# Patient Record
Sex: Male | Born: 1945 | Race: White | Hispanic: No | Smoking: Current some day smoker
Health system: Southern US, Community
[De-identification: ages and names within clinical notes are randomized; demographics above are authoritative.]

## PROBLEM LIST (undated history)

## (undated) DIAGNOSIS — Z9289 Personal history of other medical treatment: Secondary | ICD-10-CM

## (undated) DIAGNOSIS — K7460 Unspecified cirrhosis of liver: Secondary | ICD-10-CM

## (undated) DIAGNOSIS — I5022 Chronic systolic (congestive) heart failure: Secondary | ICD-10-CM

## (undated) DIAGNOSIS — M1990 Unspecified osteoarthritis, unspecified site: Secondary | ICD-10-CM

## (undated) DIAGNOSIS — R519 Headache, unspecified: Secondary | ICD-10-CM

## (undated) DIAGNOSIS — A159 Respiratory tuberculosis unspecified: Secondary | ICD-10-CM

## (undated) DIAGNOSIS — I480 Paroxysmal atrial fibrillation: Secondary | ICD-10-CM

## (undated) DIAGNOSIS — F1210 Cannabis abuse, uncomplicated: Secondary | ICD-10-CM

## (undated) DIAGNOSIS — J189 Pneumonia, unspecified organism: Secondary | ICD-10-CM

## (undated) DIAGNOSIS — Z720 Tobacco use: Secondary | ICD-10-CM

## (undated) DIAGNOSIS — K219 Gastro-esophageal reflux disease without esophagitis: Secondary | ICD-10-CM

## (undated) DIAGNOSIS — R51 Headache: Secondary | ICD-10-CM

## (undated) DIAGNOSIS — B1920 Unspecified viral hepatitis C without hepatic coma: Secondary | ICD-10-CM

## (undated) DIAGNOSIS — I10 Essential (primary) hypertension: Secondary | ICD-10-CM

## (undated) DIAGNOSIS — F419 Anxiety disorder, unspecified: Secondary | ICD-10-CM

## (undated) DIAGNOSIS — J939 Pneumothorax, unspecified: Secondary | ICD-10-CM

## (undated) DIAGNOSIS — I739 Peripheral vascular disease, unspecified: Secondary | ICD-10-CM

## (undated) DIAGNOSIS — E7800 Pure hypercholesterolemia, unspecified: Secondary | ICD-10-CM

## (undated) DIAGNOSIS — N200 Calculus of kidney: Secondary | ICD-10-CM

## (undated) DIAGNOSIS — K7290 Hepatic failure, unspecified without coma: Secondary | ICD-10-CM

## (undated) DIAGNOSIS — J449 Chronic obstructive pulmonary disease, unspecified: Secondary | ICD-10-CM

## (undated) DIAGNOSIS — I2510 Atherosclerotic heart disease of native coronary artery without angina pectoris: Secondary | ICD-10-CM

## (undated) DIAGNOSIS — I428 Other cardiomyopathies: Secondary | ICD-10-CM

## (undated) HISTORY — PX: EXTRACORPOREAL SHOCK WAVE LITHOTRIPSY: SHX1557

## (undated) HISTORY — PX: CATARACT EXTRACTION W/ INTRAOCULAR LENS  IMPLANT, BILATERAL: SHX1307

---

## 1998-02-27 DIAGNOSIS — Z9289 Personal history of other medical treatment: Secondary | ICD-10-CM

## 1998-02-27 HISTORY — PX: ABOVE KNEE LEG AMPUTATION: SUR20

## 1998-02-27 HISTORY — DX: Personal history of other medical treatment: Z92.89

## 2005-02-27 DIAGNOSIS — A159 Respiratory tuberculosis unspecified: Secondary | ICD-10-CM

## 2005-02-27 HISTORY — DX: Respiratory tuberculosis unspecified: A15.9

## 2015-05-29 DIAGNOSIS — J189 Pneumonia, unspecified organism: Secondary | ICD-10-CM

## 2015-05-29 HISTORY — DX: Pneumonia, unspecified organism: J18.9

## 2015-06-23 ENCOUNTER — Encounter (HOSPITAL_COMMUNITY): Payer: Self-pay | Admitting: Emergency Medicine

## 2015-06-23 ENCOUNTER — Emergency Department (HOSPITAL_COMMUNITY): Payer: Medicare Other

## 2015-06-23 ENCOUNTER — Inpatient Hospital Stay (HOSPITAL_COMMUNITY)
Admission: EM | Admit: 2015-06-23 | Discharge: 2015-06-28 | DRG: 286 | Disposition: A | Discharge status: Home / Self Care | Payer: Medicare Other | Attending: Oncology | Admitting: Oncology

## 2015-06-23 DIAGNOSIS — E43 Unspecified severe protein-calorie malnutrition: Secondary | ICD-10-CM | POA: Insufficient documentation

## 2015-06-23 DIAGNOSIS — J440 Chronic obstructive pulmonary disease with acute lower respiratory infection: Secondary | ICD-10-CM | POA: Diagnosis present

## 2015-06-23 DIAGNOSIS — I2510 Atherosclerotic heart disease of native coronary artery without angina pectoris: Secondary | ICD-10-CM | POA: Diagnosis not present

## 2015-06-23 DIAGNOSIS — N400 Enlarged prostate without lower urinary tract symptoms: Secondary | ICD-10-CM | POA: Diagnosis present

## 2015-06-23 DIAGNOSIS — I739 Peripheral vascular disease, unspecified: Secondary | ICD-10-CM | POA: Diagnosis present

## 2015-06-23 DIAGNOSIS — J9620 Acute and chronic respiratory failure, unspecified whether with hypoxia or hypercapnia: Secondary | ICD-10-CM | POA: Diagnosis present

## 2015-06-23 DIAGNOSIS — J189 Pneumonia, unspecified organism: Secondary | ICD-10-CM | POA: Diagnosis present

## 2015-06-23 DIAGNOSIS — R64 Cachexia: Secondary | ICD-10-CM | POA: Diagnosis present

## 2015-06-23 DIAGNOSIS — I480 Paroxysmal atrial fibrillation: Secondary | ICD-10-CM | POA: Diagnosis present

## 2015-06-23 DIAGNOSIS — F17200 Nicotine dependence, unspecified, uncomplicated: Secondary | ICD-10-CM | POA: Diagnosis present

## 2015-06-23 DIAGNOSIS — I5021 Acute systolic (congestive) heart failure: Secondary | ICD-10-CM | POA: Diagnosis present

## 2015-06-23 DIAGNOSIS — A159 Respiratory tuberculosis unspecified: Secondary | ICD-10-CM | POA: Insufficient documentation

## 2015-06-23 DIAGNOSIS — Z8611 Personal history of tuberculosis: Secondary | ICD-10-CM

## 2015-06-23 DIAGNOSIS — Z681 Body mass index (BMI) 19 or less, adult: Secondary | ICD-10-CM | POA: Diagnosis not present

## 2015-06-23 DIAGNOSIS — R768 Other specified abnormal immunological findings in serum: Secondary | ICD-10-CM | POA: Insufficient documentation

## 2015-06-23 DIAGNOSIS — I4891 Unspecified atrial fibrillation: Secondary | ICD-10-CM | POA: Diagnosis present

## 2015-06-23 DIAGNOSIS — L8990 Pressure ulcer of unspecified site, unspecified stage: Secondary | ICD-10-CM | POA: Diagnosis present

## 2015-06-23 DIAGNOSIS — I429 Cardiomyopathy, unspecified: Secondary | ICD-10-CM | POA: Diagnosis present

## 2015-06-23 DIAGNOSIS — R54 Age-related physical debility: Secondary | ICD-10-CM | POA: Diagnosis present

## 2015-06-23 DIAGNOSIS — I5023 Acute on chronic systolic (congestive) heart failure: Secondary | ICD-10-CM | POA: Insufficient documentation

## 2015-06-23 DIAGNOSIS — Z89611 Acquired absence of right leg above knee: Secondary | ICD-10-CM

## 2015-06-23 DIAGNOSIS — I110 Hypertensive heart disease with heart failure: Secondary | ICD-10-CM | POA: Diagnosis present

## 2015-06-23 DIAGNOSIS — B182 Chronic viral hepatitis C: Secondary | ICD-10-CM | POA: Diagnosis present

## 2015-06-23 DIAGNOSIS — J431 Panlobular emphysema: Secondary | ICD-10-CM | POA: Insufficient documentation

## 2015-06-23 DIAGNOSIS — J449 Chronic obstructive pulmonary disease, unspecified: Secondary | ICD-10-CM | POA: Diagnosis not present

## 2015-06-23 HISTORY — DX: Peripheral vascular disease, unspecified: I73.9

## 2015-06-23 HISTORY — DX: Chronic obstructive pulmonary disease, unspecified: J44.9

## 2015-06-23 LAB — CBC
HEMATOCRIT: 43.4 % (ref 39.0–52.0)
HEMOGLOBIN: 13.9 g/dL (ref 13.0–17.0)
MCH: 31.4 pg (ref 26.0–34.0)
MCHC: 32 g/dL (ref 30.0–36.0)
MCV: 98 fL (ref 78.0–100.0)
Platelets: 139 10*3/uL — ABNORMAL LOW (ref 150–400)
RBC: 4.43 MIL/uL (ref 4.22–5.81)
RDW: 14.6 % (ref 11.5–15.5)
WBC: 7.3 10*3/uL (ref 4.0–10.5)

## 2015-06-23 LAB — BASIC METABOLIC PANEL
Anion gap: 13 (ref 5–15)
BUN: 11 mg/dL (ref 6–20)
CHLORIDE: 103 mmol/L (ref 101–111)
CO2: 22 mmol/L (ref 22–32)
Calcium: 8.8 mg/dL — ABNORMAL LOW (ref 8.9–10.3)
Creatinine, Ser: 0.85 mg/dL (ref 0.61–1.24)
GFR calc Af Amer: >60 mL/min (ref >60)
Glucose, Bld: 122 mg/dL — ABNORMAL HIGH (ref 65–99)
Potassium: 4.3 mmol/L (ref 3.5–5.1)
SODIUM: 138 mmol/L (ref 135–145)

## 2015-06-23 LAB — RAPID URINE DRUG SCREEN, HOSP PERFORMED
AMPHETAMINES: NOT DETECTED
Barbiturates: NOT DETECTED
Benzodiazepines: POSITIVE — AB
Cocaine: NOT DETECTED
OPIATES: NOT DETECTED
Tetrahydrocannabinol: POSITIVE — AB

## 2015-06-23 LAB — URINALYSIS, ROUTINE W REFLEX MICROSCOPIC
Bilirubin Urine: NEGATIVE
Glucose, UA: NEGATIVE mg/dL
KETONES UR: NEGATIVE mg/dL
LEUKOCYTES UA: NEGATIVE
NITRITE: NEGATIVE
Protein, ur: NEGATIVE mg/dL
pH: 6 (ref 5.0–8.0)

## 2015-06-23 LAB — URINE MICROSCOPIC-ADD ON: Squamous Epithelial / LPF: NONE SEEN

## 2015-06-23 LAB — MAGNESIUM: Magnesium: 1.7 mg/dL (ref 1.7–2.4)

## 2015-06-23 LAB — BRAIN NATRIURETIC PEPTIDE: B Natriuretic Peptide: 524.4 pg/mL — ABNORMAL HIGH (ref 0.0–100.0)

## 2015-06-23 LAB — HEPATIC FUNCTION PANEL
ALBUMIN: 3.1 g/dL — AB (ref 3.5–5.0)
ALT: 39 U/L (ref 17–63)
AST: 48 U/L — AB (ref 15–41)
Alkaline Phosphatase: 76 U/L (ref 38–126)
BILIRUBIN DIRECT: 0.2 mg/dL (ref 0.1–0.5)
Indirect Bilirubin: 0.4 mg/dL (ref 0.3–0.9)
Total Bilirubin: 0.6 mg/dL (ref 0.3–1.2)
Total Protein: 7.4 g/dL (ref 6.5–8.1)

## 2015-06-23 LAB — I-STAT TROPONIN, ED: Troponin i, poc: 0 ng/mL (ref 0.00–0.08)

## 2015-06-23 LAB — PROCALCITONIN

## 2015-06-23 LAB — TROPONIN I: TROPONIN I: 0.03 ng/mL (ref <0.031)

## 2015-06-23 LAB — TSH: TSH: 0.787 u[IU]/mL (ref 0.350–4.500)

## 2015-06-23 MED ORDER — DILTIAZEM LOAD VIA INFUSION
10.0000 mg | Freq: Once | INTRAVENOUS | Status: AC
  Administered 2015-06-23: 10 mg via INTRAVENOUS
  Filled 2015-06-23: qty 10

## 2015-06-23 MED ORDER — PANTOPRAZOLE SODIUM 40 MG PO TBEC
40.0000 mg | DELAYED_RELEASE_TABLET | Freq: Every day | ORAL | Status: DC
  Administered 2015-06-23 – 2015-06-28 (×6): 40 mg via ORAL
  Filled 2015-06-23 (×6): qty 1

## 2015-06-23 MED ORDER — METOPROLOL TARTRATE 12.5 MG HALF TABLET
12.5000 mg | ORAL_TABLET | Freq: Two times a day (BID) | ORAL | Status: DC
  Administered 2015-06-23 – 2015-06-25 (×4): 12.5 mg via ORAL
  Filled 2015-06-23 (×4): qty 1

## 2015-06-23 MED ORDER — SODIUM CHLORIDE 0.9 % IV BOLUS (SEPSIS)
1000.0000 mL | Freq: Once | INTRAVENOUS | Status: AC
  Administered 2015-06-23: 1000 mL via INTRAVENOUS

## 2015-06-23 MED ORDER — ACETAMINOPHEN 325 MG PO TABS
650.0000 mg | ORAL_TABLET | Freq: Four times a day (QID) | ORAL | Status: DC | PRN
  Administered 2015-06-23 – 2015-06-24 (×3): 650 mg via ORAL
  Filled 2015-06-23 (×4): qty 2

## 2015-06-23 MED ORDER — IOPAMIDOL (ISOVUE-370) INJECTION 76%
INTRAVENOUS | Status: AC
  Administered 2015-06-23: 100 mL
  Filled 2015-06-23: qty 100

## 2015-06-23 MED ORDER — ENOXAPARIN SODIUM 40 MG/0.4ML ~~LOC~~ SOLN
40.0000 mg | SUBCUTANEOUS | Status: DC
  Administered 2015-06-23 – 2015-06-25 (×3): 40 mg via SUBCUTANEOUS
  Filled 2015-06-23 (×3): qty 0.4

## 2015-06-23 MED ORDER — IPRATROPIUM-ALBUTEROL 0.5-2.5 (3) MG/3ML IN SOLN
3.0000 mL | RESPIRATORY_TRACT | Status: DC | PRN
  Administered 2015-06-23 – 2015-06-25 (×3): 3 mL via RESPIRATORY_TRACT
  Filled 2015-06-23 (×3): qty 3

## 2015-06-23 MED ORDER — SODIUM CHLORIDE 0.9% FLUSH
3.0000 mL | Freq: Two times a day (BID) | INTRAVENOUS | Status: DC
  Administered 2015-06-23 – 2015-06-25 (×3): 3 mL via INTRAVENOUS

## 2015-06-23 MED ORDER — DEXTROSE 5 % IV SOLN
1.0000 g | INTRAVENOUS | Status: DC
  Administered 2015-06-23: 1 g via INTRAVENOUS
  Filled 2015-06-23 (×2): qty 10

## 2015-06-23 MED ORDER — DOCUSATE SODIUM 100 MG PO CAPS
100.0000 mg | ORAL_CAPSULE | Freq: Every day | ORAL | Status: DC | PRN
  Administered 2015-06-23: 100 mg via ORAL
  Filled 2015-06-23: qty 1

## 2015-06-23 MED ORDER — DEXTROSE 5 % IV SOLN
500.0000 mg | INTRAVENOUS | Status: AC
  Administered 2015-06-23 – 2015-06-24 (×2): 500 mg via INTRAVENOUS
  Filled 2015-06-23 (×2): qty 500

## 2015-06-23 MED ORDER — DILTIAZEM HCL 100 MG IV SOLR
5.0000 mg/h | INTRAVENOUS | Status: DC
  Administered 2015-06-23: 15 mg/h via INTRAVENOUS
  Administered 2015-06-23 (×2): 5 mg/h via INTRAVENOUS
  Filled 2015-06-23 (×3): qty 100

## 2015-06-23 MED ORDER — GUAIFENESIN-CODEINE 100-10 MG/5ML PO SOLN
10.0000 mL | Freq: Four times a day (QID) | ORAL | Status: DC | PRN
  Administered 2015-06-23 (×3): 10 mL via ORAL
  Filled 2015-06-23 (×3): qty 10

## 2015-06-23 MED ORDER — MAGNESIUM SULFATE IN D5W 1-5 GM/100ML-% IV SOLN
1.0000 g | Freq: Once | INTRAVENOUS | Status: AC
  Administered 2015-06-23: 1 g via INTRAVENOUS
  Filled 2015-06-23: qty 100

## 2015-06-23 NOTE — ED Notes (Signed)
Patient with shortness of breath that started last night.  Patient states that he was having a hard time to breath while sitting down.  Patient does have a cough with the shortness of breath.

## 2015-06-23 NOTE — Progress Notes (Signed)
Patient lying in bed, difficulty with breathing. Repositioned him, no other needs at this time. Call light within reach

## 2015-06-23 NOTE — ED Provider Notes (Signed)
TIME SEEN: 6:10 AM  CHIEF COMPLAINT: Shortness of breath  HPI: Pt is a 70 y.o. 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 Shortness of breath was significantly worse last night. He has had intermittent central chest pain that he is unable to describe. Last episode was several days ago. Denies fevers. Has had a productive cough with clear sputum. No vomiting or diarrhea. No history of PE or DVT. Reports he got down to 100 pounds but has gained 18 pounds in the past several weeks. States he lost all that way because of poor appetite. Denies any known history of cancer. Has never been told he has atrial fibrillation before. Denies heavy alcohol use, illicit drug use.  ROS: See HPI Constitutional: no fever  Eyes: no drainage  ENT: no runny nose   Cardiovascular:  chest pain  Resp: SOB  GI: no vomiting GU: no dysuria Integumentary: no rash  Allergy: no hives  Musculoskeletal: no leg swelling  Neurological: no slurred speech ROS otherwise negative  PAST MEDICAL HISTORY/PAST SURGICAL HISTORY:  Past Medical History  Diagnosis Date  . COPD (chronic obstructive pulmonary disease) (HCC)   . PVD (peripheral vascular disease) (HCC)     MEDICATIONS:  Prior to Admission medications   Not on File    ALLERGIES:  No Known Allergies  SOCIAL HISTORY:  Social History  Substance Use Topics  . Smoking status: Current Some Day Smoker  . Smokeless tobacco: Not on file  . Alcohol Use: No    FAMILY HISTORY: No family history on file.  EXAM: BP 121/82 mmHg  Pulse 85  Temp(Src) 98.1 F (36.7 C) (Oral)  Resp 20  SpO2 96% CONSTITUTIONAL: Alert and oriented and responds appropriately to questions. Patient appears thin, chronically ill-appearing HEAD:  Normocephalic EYES: Conjunctivae clear, PERRL ENT: normal nose; no rhinorrhea; moist mucous membranes NECK: Supple, no meningismus, no LAD  CARD: Irregularly irregular and tachycardic; S1 and S2 appreciated; no murmurs, no clicks, no rubs, no gallops RESP: Normal chest excursion without splinting or tachypnea; breath sounds clear and equal bilaterally; no wheezes, no rhonchi, no rales, no hypoxia or respiratory distress, speaking full sentences, diminished at bases bilaterally, oxygen saturation 94% on room air at rest ABD/GI: Normal bowel sounds; non-distended; soft, non-tender, no rebound, no guarding, no peritoneal signs BACK:  The back appears normal and is non-tender to palpation, there is no CVA tenderness EXT: Patient is status post right AKA; Normal ROM in all joints; non-tender to palpation; no edema; normal capillary refill; no cyanosis, no calf tenderness or swelling    SKIN: Normal color for age and race; warm; no rash NEURO: Moves all extremities equally, sensation to light touch intact diffusely, cranial nerves II through XII intact PSYCH: The patient's mood and manner are appropriate. Grooming and personal hygiene are appropriate.  MEDICAL DECISION MAKING: Patient here with shortness of breath, new onset atrial fibrillation with RVR. Blood pressure is normal. Lungs clear except for diminished aeration at his bases bilaterally. Concern for possible pulmonary embolus, lung cancer given his presentation, appearance. We'll give IV fluids, diltiazem bolus and drip. We'll obtain cardiac labs, CT of his chest. He will need admission.  ED PROGRESS: 7:45 AM  Pt's heart rate is down to the 110s still in atrial fibrillation. He is on  15 mg per hour of diltiazem. Labs show potassium of 4.3, magnesium of 1.7. Will give IV magnesium. Troponin is negative. BNP mildly elevated at 524. TSH normal. Chest x-ray shows lingular airspace disease worrisome for pneumonia. He states he is having a cough with  clear sputum production but no fever. No leukocytosis. I do not feel clinically has pneumonia. We will obtain a CT of his chest to evaluate for possible PE. Discussed this with Dr. Yetta Barre with internal medicine teaching service. He agrees on admission to telemetry, inpatient. I will place will orders per his request. They're attending is Dr. Blanch Media. Patient and family have been updated with this plan. We'll hold off on antibiotics at this time until after CT scan and if CT shows pneumonia, and bikes can be ordered by internal medicine service. Patient is hemodynamically stable.     EKG Interpretation  Date/Time:  Wednesday June 23 2015 05:54:54 EDT Ventricular Rate:  185 PR Interval:    QRS Duration: 91 QT Interval:  272 QTC Calculation: 477 R Axis:   91 Text Interpretation:  Atrial fibrillation with rapid V-rate Anterior infarct, old Baseline wander in lead(s) II III aVF V3 No old tracing to compare Confirmed by Rakiyah Esch,  DO, Fernando Stoiber ([id number]) on 06/23/2015 6:04:09 AM         CRITICAL CARE Performed by: Raelyn Number   Total critical care time: 45 minutes  Critical care time was exclusive of separately billable procedures and treating other patients.  Critical care was necessary to treat or prevent imminent or life-threatening deterioration.  Critical care was time spent personally by me on the following activities: development of treatment plan with patient and/or surrogate as well as nursing, discussions with consultants, evaluation of patient's response to treatment, examination of patient, obtaining history from patient or surrogate, ordering and performing treatments and interventions, ordering and review of laboratory studies, ordering and review of radiographic studies, pulse oximetry and re-evaluation of patient's condition.   Layla Maw Jalee Saine, DO 06/23/15 [phone]

## 2015-06-23 NOTE — H&P (Signed)
Date: 06/23/2015               Patient Name:  [name] MRN: [id number]  DOB: 1945/11/30 Age / Sex: 70 y.o., male   PCP: No primary care provider on file.         Medical Service: Internal Medicine Teaching Service         Attending Physician: Dr. Burns Spain, MD    First Contact: Dr. Ladona Ridgel Pager: [phone]  Second Contact: Dr. Beckie Salts Pager: [phone]       After Hours (After 5p/  First Contact Pager: [phone]  weekends / holidays): Second Contact Pager: [phone]   Chief Complaint: SOB  History of Present Illness: [name] is a 70 y.o. male w/ PMHx of COPD, Chronic Hep C, PVD s/p RLE AKA, and tobacco abuse, presents to the ED w/ complaints of worsening SOB and cough over the past 2-3 months. He says that over the past 1-2 weeks it has become too overwhelming and today he felt the need to come to the ED. He also describes an associated productive cough with yellow sputum, and recent chills. He denies nausea, vomiting, diarrhea, abdominal pain, myalgias, or fatigue. Does have some mild generalized weakness however. He has had weight loss over the past year, but actually says he feels like he has gained about 10-15 lbs in the last few weeks. He describes a general lack of appetite. [facility] [place] but has not been seen there for several months. He uses an inhaler at times for his known COPD, but has not for a while because he has not been seen at the [place] for months. He also admits to having sleep in a recliner due to SOB when he sleeps, but denies PND.   Of note, patient has previous history of Tb that he states was treated with full course of RIPE therapy, he thinks about 5 years ago at the [place]. Says he spent 11 days at the hospital and then was supervised at home by the [facility] for each medication dose for several months. Is not sure how he  Acquired Tb at that time, thinks he got it from the [facility] hospital.   In  the ED, patient was noted to have quite rapid Atrial Fibrillation in the 180's with mild dizziness and palpitations. Says he has experienced these symptoms before.   Patient denies alcohol use, says he does smoke marijuana on occasion, sometimes will take a Xanax to help him sleep, but otherwise denies recreational drug use. Says he used to use cocaine several years ago. He smokes 1-2 cigs per day. No significant family history.  Meds: Current Facility-Administered Medications  Medication Dose Route Frequency Provider Last Rate Last Dose  . azithromycin (ZITHROMAX) 500 mg in dextrose 5 % 250 mL IVPB  500 mg Intravenous Q24H Courtney Paris, MD   500 mg at 06/23/15 1114  . cefTRIAXone (ROCEPHIN) 1 g in dextrose 5 % 50 mL IVPB  1 g Intravenous Q24H Courtney Paris, MD   1 g at 06/23/15 1015  . diltiazem (CARDIZEM) 100 mg in dextrose 5 % 100 mL (1 mg/mL) infusion  5-15 mg/hr Intravenous Continuous Kristen N Ward, DO 15 mL/hr at 06/23/15 1033 15 mg/hr at 06/23/15 1033  . enoxaparin (LOVENOX) injection 40 mg  40 mg Subcutaneous Q24H Courtney Paris, MD   40 mg at 06/23/15 1016  . guaiFENesin-codeine 100-10 MG/5ML solution 10 mL  10 mL Oral Q6H PRN Courtney Paris, MD   10 mL at 06/23/15 1033  . ipratropium-albuterol (DUONEB) 0.5-2.5 (3) MG/3ML nebulizer solution 3 mL  3 mL Nebulization Q4H PRN Courtney Paris, MD      . sodium chloride flush (NS) 0.9 % injection 3 mL  3 mL Intravenous Q12H Courtney Paris, MD   0 mL at 06/23/15 1016    Allergies: Allergies as of 06/23/2015  . (No Known Allergies)   Past Medical History  Diagnosis Date  . COPD (chronic obstructive pulmonary disease) (HCC)   . PVD (peripheral vascular disease) (HCC)    History reviewed. No pertinent past surgical history. No family history on file. Social History   Social History  . Marital Status: Single    Spouse Name: N/A  . Number of Children: N/A  . Years of Education: N/A   Occupational History  . Not on file.   Social History  Main Topics  . Smoking status: Current Some Day Smoker  . Smokeless tobacco: Not on file  . Alcohol Use: No  . Drug Use: Yes    Special: Marijuana  . Sexual Activity: Not on file   Other Topics Concern  . Not on file   Social History Narrative  . No narrative on file    Review of Systems: General: Positive for chills and decreased appetite. Denies fever, diaphoresis, and fatigue.  Respiratory: Positive for DOE/SOB, productive cough, and wheezing. Denies chest tightness.   Cardiovascular: Positive for palpitations. Denies chest pain.  Gastrointestinal: Denies nausea, vomiting, abdominal pain, diarrhea, constipation, blood in stool and abdominal distention.  Genitourinary: Positive for hematuria, urgency, and increased urinary frequency. Denies dysuria and flank pain. Endocrine: Denies hot or cold intolerance, polyuria, and polydipsia. Musculoskeletal: Denies myalgias, back pain, joint swelling, arthralgias and gait problem.  Skin: Denies pallor, rash and wounds.  Neurological: Positive for generalized weakness and dizziness. Denies seizures, syncope, lightheadedness, numbness and headaches.  Psychiatric/Behavioral: Denies mood changes, confusion, nervousness, sleep disturbance and agitation.   Physical Exam: Blood pressure 108/62, pulse 88, temperature 97.7 F (36.5 C), temperature source Oral, resp. rate 18, SpO2 91 %.  General: Thin appearing while male, alert, cooperative, NAD. HEENT: PERRL, EOMI. Moist mucus membranes. Poor dentition.  Neck: Full range of motion without pain, supple, no lymphadenopathy or carotid bruits. No JVD.  Lungs: Air entry equal bilaterally. Decreased breath sounds throughout, scattered faint wheezes. No rales or rhonchi appreciated. Heart: Distant heart sounds, rate controlled, irregular rhythm, no murmurs, gallops, or rubs Abdomen: Soft, non-tender, non-distended, BS + Extremities: No cyanosis, clubbing, or edema. RLE AKA. Mild chronic venous stasis  changes on LLE. Distal pulse intact.  Neurologic: Alert & oriented x3, cranial nerves II-XII intact, strength grossly intact, sensation intact to light touch   Lab results: Basic Metabolic Panel:  Recent Labs  08/65/78 0615  NA 138  K 4.3  CL 103  CO2 22  GLUCOSE 122*  BUN 11  CREATININE 0.85  CALCIUM 8.8*  MG 1.7   Liver Function Tests:  Recent Labs  06/23/15 0615  AST 48*  ALT 39  ALKPHOS 76  BILITOT 0.6  PROT 7.4  ALBUMIN 3.1*    CBC:  Recent Labs  06/23/15 0615  WBC 7.3  HGB 13.9  HCT 43.4  MCV 98.0  PLT 139*   Cardiac Enzymes:  Recent Labs  06/23/15 0615  TROPONINI 0.03   BNP: 524.4  Thyroid Function Tests:  Recent Labs  06/23/15 0615  TSH 0.787  Urine Drug Screen: Drugs of Abuse     Component Value Date/Time   LABOPIA NONE DETECTED 06/23/2015 0810   COCAINSCRNUR NONE DETECTED 06/23/2015 0810   LABBENZ POSITIVE* 06/23/2015 0810   AMPHETMU NONE DETECTED 06/23/2015 0810   THCU POSITIVE* 06/23/2015 0810   LABBARB NONE DETECTED 06/23/2015 0810    Imaging results:  Ct Angio Chest Pe W/cm &/or Wo Cm  06/23/2015  CLINICAL DATA:  Shortness of breath and chest pain, initial encounter EXAM: CT ANGIOGRAPHY CHEST WITH CONTRAST TECHNIQUE: Multidetector CT imaging of the chest was performed using the standard protocol during bolus administration of intravenous contrast. Multiplanar CT image reconstructions and MIPs were obtained to evaluate the vascular anatomy. CONTRAST:  100 mL Isovue 370. COMPARISON:  Plain film from earlier in the same day. FINDINGS: Biapical scarring with calcifications are identified. Diffuse emphysematous changes are noted within both lungs. Infiltrate is noted in the lower lobe anteriorly on the left consistent with that seen on the prior exam but lying within the lower lobe as opposed to the lingula. Small pleural effusion is noted. Some mild posterior lower lobe infiltrate is seen. Scattered changes are noted within the  right lung in a patchy area of somewhat infiltrative changes noted in best seen on image number 49 of series 1006. The thoracic inlet is within normal limits. The thoracic aorta shows calcifications. No aneurysmal dilatation or definitive dissection is seen. Pulmonary artery demonstrates a normal branching pattern. No filling defects are identified. Some calcified lymph nodes are noted as well as some calcified at mediastinal lymph nodes in the subcarinal region. The most prominent right hilar lymph node measures approximately 14 mm in short axis. Coronary calcifications are seen. Scanning into the upper abdomen shows renal cystic change on the left. The osseous structures show diffuse osteopenia. Motion artifact somewhat limits evaluation of the rib cage. No acute appearing compression deformities are noted. Mild compression deformity is seen at T8 and T12. Review of the MIP images confirms the above findings. IMPRESSION: No definitive pulmonary emboli. Patchy infiltrates bilaterally worse in the left lower lobe. Followup PA and lateral chest X-ray is recommended in 3-4 weeks following trial of antibiotic therapy to ensure resolution and exclude underlying malignancy. Changes consistent with prior granulomatous disease. Electronically Signed   By: Alcide Clever M.D.   On: 06/23/2015 09:45   Dg Chest Portable 1 View  06/23/2015  CLINICAL DATA:  Mid chest and epigastric pain today. Initial encounter. EXAM: PORTABLE CHEST 1 VIEW COMPARISON:  None. FINDINGS: The lungs are emphysematous. Focal airspace disease is seen in the lingula. Multiple calcified granulomata are seen in the left upper lobe. A small cluster of calcifications is also seen in the right upper lobe. Heart size is enlarged. No pneumothorax or pleural effusion. IMPRESSION: Lingular airspace disease worrisome for pneumonia. Recommend followup to clearing. Emphysema. Old granulomatous disease. Electronically Signed   By: Drusilla Kanner M.D.   On:  06/23/2015 07:24    Other results: EKG: Atrial Fibrillation w/ RVR, rate of 185.   Assessment & Plan by Problem: 70 y.o. male w/ PMHx of COPD, Chronic Hep C, PVD s/p RLE AKA, and tobacco abuse, admitted for LML pneumonia.   Shortness of Breath: Patient with prolonged cough and SOB, worse over the last few weeks. Cough productive of yellowish sputum. Also noted fevers at home. Patient with known h/o lung disease (COPD) but no PFT's on file given that he receives his care at the [place] in [place] per the patient but has not  been there for several months. Only uses an albuterol inhaler at times but has not for a long time. Has symptoms of orthopnea, but no PND. On admission, patient had very rapid Atrial Fibrillation which was not previously diagnosed, as well as a LML pneumonia seen on CXR and CTA. No PE on CT. Patient also has known h/o Tb which he states he was treated for ~5 years ago. Suspect his SOB and productive cough is 2/2 community acquired pneumonia as seen on imaging, although patient has no leukocytosis or documented fever. Also very likely that his exertional dyspnea could be related to undiagnosed A-fib with RVR. Only scant wheezing on exam, do not think this is as much related to a COPD exacerbation as it is CAP  -Admit to telemetry -Start Rocephin + Azithromycin for CAP coverage -Check PCT -Continue Cardizem gtt; change to po when able -ECHO pending -Repeat EKG in AM -Duonebs q4h prn -Guaifenesin-Codeine prn for cough -If no improvement in pulmonary symptoms in 24 hours, would consider adding steroids -If no improvement in pulmonary symptoms in 72 hours, would consider alternative diagnosis such as atypical infection vs LML malignancy given chronicity of symptoms -Supplemental O2  COPD: Patient with extensive bullous disease on CT, states he has been told he has emphysema in the past. Still smokes 1-2 cigs per day. No PFT's given that he receives his care at [place].  -Duonebs as  above -Consider steroids if no improvement in respiratory symptoms -Would likely benefit from outpatient PFT's and inhalers on discharge  Previous h/o Tb: Says this was diagnosed ~5 years ago at the [place], [place]. Fully treated. Spent 11 days in the hospital and had witnessed medication administration with the [facility]. Obvious granulomatous disease on CTA.  -Obtain records from [facility]  Chronic Hepatitis C: Patient knows he has this. Mild LFT abnormalities. Has discussed treatment at the [place] in the past.  -Further management as an outpatient  Protein Calorie Malnutrition: Patient quite thin, ill-appearing, low albumin.  -Nutrition consult  Hematuria: Patient reported. Says he had a bit of blood in his urin recently. Also with BPH symptoms.  -Check UA -Consider starting Flomax for BPH symptoms  DVT/PE PPx: Lovenox [place]  Dispo: Disposition is deferred at this time, awaiting improvement of current medical problems. Anticipated discharge in approximately 1-2 day(s).   The patient does have a current PCP (No primary care provider on file.) and does not need an [facility] hospital follow-up appointment after discharge.  The patient does not have transportation limitations that hinder transportation to clinic appointments.  Signed: Courtney Paris, MD 06/23/2015, 11:40 AM

## 2015-06-23 NOTE — ED Notes (Signed)
Pt transported to CT ?

## 2015-06-23 NOTE — ED Notes (Signed)
Pt granddaughter # [phone]

## 2015-06-24 ENCOUNTER — Inpatient Hospital Stay (HOSPITAL_COMMUNITY): Payer: Medicare Other

## 2015-06-24 DIAGNOSIS — J449 Chronic obstructive pulmonary disease, unspecified: Secondary | ICD-10-CM

## 2015-06-24 DIAGNOSIS — E46 Unspecified protein-calorie malnutrition: Secondary | ICD-10-CM

## 2015-06-24 DIAGNOSIS — B182 Chronic viral hepatitis C: Secondary | ICD-10-CM

## 2015-06-24 DIAGNOSIS — R319 Hematuria, unspecified: Secondary | ICD-10-CM

## 2015-06-24 DIAGNOSIS — I4891 Unspecified atrial fibrillation: Secondary | ICD-10-CM

## 2015-06-24 DIAGNOSIS — Z8611 Personal history of tuberculosis: Secondary | ICD-10-CM

## 2015-06-24 DIAGNOSIS — L8990 Pressure ulcer of unspecified site, unspecified stage: Secondary | ICD-10-CM | POA: Insufficient documentation

## 2015-06-24 DIAGNOSIS — E43 Unspecified severe protein-calorie malnutrition: Secondary | ICD-10-CM | POA: Insufficient documentation

## 2015-06-24 DIAGNOSIS — Z87891 Personal history of nicotine dependence: Secondary | ICD-10-CM

## 2015-06-24 DIAGNOSIS — I739 Peripheral vascular disease, unspecified: Secondary | ICD-10-CM

## 2015-06-24 DIAGNOSIS — J9620 Acute and chronic respiratory failure, unspecified whether with hypoxia or hypercapnia: Secondary | ICD-10-CM

## 2015-06-24 LAB — BASIC METABOLIC PANEL
ANION GAP: 9 (ref 5–15)
BUN: 12 mg/dL (ref 6–20)
CALCIUM: 8.1 mg/dL — AB (ref 8.9–10.3)
CHLORIDE: 106 mmol/L (ref 101–111)
CO2: 22 mmol/L (ref 22–32)
Creatinine, Ser: 0.85 mg/dL (ref 0.61–1.24)
GFR calc Af Amer: >60 mL/min (ref >60)
GFR calc non Af Amer: >60 mL/min (ref >60)
Glucose, Bld: 135 mg/dL — ABNORMAL HIGH (ref 65–99)
Potassium: 4.7 mmol/L (ref 3.5–5.1)
Sodium: 137 mmol/L (ref 135–145)

## 2015-06-24 LAB — CBC
HCT: 39.3 % (ref 39.0–52.0)
Hemoglobin: 12.4 g/dL — ABNORMAL LOW (ref 13.0–17.0)
MCH: 31.2 pg (ref 26.0–34.0)
MCHC: 31.6 g/dL (ref 30.0–36.0)
MCV: 99 fL (ref 78.0–100.0)
Platelets: 123 10*3/uL — ABNORMAL LOW (ref 150–400)
RBC: 3.97 MIL/uL — AB (ref 4.22–5.81)
RDW: 14.9 % (ref 11.5–15.5)
WBC: 6.1 10*3/uL (ref 4.0–10.5)

## 2015-06-24 LAB — HIV ANTIBODY (ROUTINE TESTING W REFLEX): HIV SCREEN 4TH GENERATION: NONREACTIVE

## 2015-06-24 LAB — ECHOCARDIOGRAM COMPLETE

## 2015-06-24 MED ORDER — MELATONIN 3 MG PO TABS
3.0000 mg | ORAL_TABLET | Freq: Every day | ORAL | Status: DC
  Administered 2015-06-24 – 2015-06-27 (×4): 3 mg via ORAL
  Filled 2015-06-24 (×5): qty 1

## 2015-06-24 MED ORDER — ENSURE ENLIVE PO LIQD
237.0000 mL | Freq: Two times a day (BID) | ORAL | Status: DC
  Administered 2015-06-28: 237 mL via ORAL

## 2015-06-24 MED ORDER — TRAZODONE HCL 50 MG PO TABS
50.0000 mg | ORAL_TABLET | Freq: Two times a day (BID) | ORAL | Status: DC
  Administered 2015-06-24 – 2015-06-28 (×9): 50 mg via ORAL
  Filled 2015-06-24 (×9): qty 1

## 2015-06-24 MED ORDER — PREDNISONE 20 MG PO TABS
40.0000 mg | ORAL_TABLET | Freq: Every day | ORAL | Status: DC
  Administered 2015-06-24 – 2015-06-27 (×4): 40 mg via ORAL
  Filled 2015-06-24 (×4): qty 2

## 2015-06-24 MED ORDER — NON FORMULARY: 0.3000 mg | Freq: Every day | Status: DC

## 2015-06-24 MED ORDER — AZITHROMYCIN 500 MG PO TABS
500.0000 mg | ORAL_TABLET | Freq: Every day | ORAL | Status: DC
  Administered 2015-06-25 – 2015-06-26 (×2): 500 mg via ORAL
  Filled 2015-06-24 (×2): qty 1

## 2015-06-24 NOTE — Progress Notes (Signed)
Date: 06/24/2015  Patient name: [name]  Medical record number: [id number]  Date of birth: 1945/12/30   I have seen and evaluated Gaylan Gerold and discussed their care with the Residency Team.   CC: dyspnea  HPI : Mr Overgaard is a 70 yo male with presumed COPD (no PFT's available). He has not seen an MD for about one yr when he moved from [place] to [facility]. He has not yet est at the [place] bc he is hardheaded and waits until he gets really bad. He has had dyspnea on exertion for yrs and has to walk slowly and uses a scooter in stores. He came to ED bc increasing dyspnea, productive cough, orthopnea, and rhinorrhea. Since he has not been to an MD in one yr, he has ran out of his one inhaler.   Today, he states the dyspnea is gone but does get DOE if he moves in bed. The cough is now dry and the rhinorrhea is gone. He feels too much pressure on his chest when he lies down.  PMHx, Fam Hx, and/or Soc Hx : Chronic Hep C, PVD, s/p RLE AKA, h/o "blood clots" in leg requiring warfarin in past. Mother died age 7, grandmother and grandfather both died age 7. Hardening of the arteries in fam hx. In [place] 2 yrs. Quit smoking 2 days ago  Filed Vitals:   06/24/15 0355 06/24/15 0915  BP: 91/64 102/74  Pulse: 68 60  Temp: 97.7 F (36.5 C)   Resp: 18    NAD, speaking in full sentences. Cachetic, temporal wasting, unkempt H irr irr L wheezing throughout both lung fields S/P R AKA Several SK on skin  I indep reviewed the CXR and compared my reading to the official reading : L lingular infiltrate, sig hyperexpansion  I indep reviewed the EKG and compared my reading to the official reading : A fib, no ischemic changes   Assessment and Plan: I have seen and evaluated the patient as outlined above. I agree with the formulated Assessment and Plan as detailed in the residents' note, with the following changes:   1. Acute on chronic respiratory failure - Most of his sxs seem to be chronic although he  does c/o an acute component. His chronic component is COPD. We do not have PFT's to confirm this but is supported by his hx, CXR, and exam. He is not yet on ideal home therapy for this. His acute component is less certain. His CXR shows an infiltrate but he has no CAP sxs. His recent A Fib with RVR can definitely be contributing (see #2). He may also have an acute COPD exac (wheezing on exam) or slight pul edema (BNP elevated). Since CAP seems less likely (calcitonin negative) will narrow ABX to just COPD exac coverage. Will add oral steroids and cont nebs. He will need a good COPD regimen at D/C and will need to F/U at the [facility] for PFt's if not already done.  2. A Fib with RVR - now rate controlled. This is a new dx although has had experienced palp previously. CHADS-VASC is 2. He has had a negative experience with warfarin in past and seemed unlikely to agree to warfarin now. After we get ECHO, will need to further addressed anticoag, maybe with a DOAC.   3. PVD - If this is an accurate Dx, would need ASA and statin. It is unlikely that we will be able to obtain records from the [place] to verify the dx. Will  discuss if he has been on these meds before.  4. H/O tobacco use - he has an infiltrate on CXR but no suggestion of lungs cancer. Encourage cessation. Burns Spain, MD 4/27/201710:13 AM

## 2015-06-24 NOTE — Progress Notes (Signed)
  Echocardiogram 2D Echocardiogram has been performed.  [name] M 06/24/2015, 12:16 PM

## 2015-06-24 NOTE — Progress Notes (Signed)
Subjective: [name].  He reports improved cough, now nonproductive, though it has continued.  He denies palpitations.   He was unable to sleep last night due to inability to lie flat.  Objective: Vital signs in last 24 hours: Filed Vitals:   06/23/15 1420 06/23/15 2125 06/23/15 2251 06/24/15 0355  BP: 138/100 91/68  91/64  Pulse: 97 63  68  Temp: 97.8 F (36.6 C) 97.6 F (36.4 C)  97.7 F (36.5 C)  TempSrc: Oral Oral  Oral  Resp: SpO2: 97% 97% 97% 98%   Weight change:   Intake/Output Summary (Last 24 hours) at 06/24/15 0913 Last data filed at 06/23/15 1421  Gross per 24 hour  Intake    480 ml  Output    200 ml  Net    280 ml   Physical Exam  Constitutional: He is oriented to person, place, and time.  Frail, chronically ill appearing male, sitting up in bed, NAD  HENT:  Head: Normocephalic and atraumatic.  Eyes: EOM are normal. No scleral icterus.  Neck: No tracheal deviation present.  Cardiovascular: Normal rate and normal heart sounds.   Irregularly irregular rhythm.    Pulmonary/Chest: Effort normal. No stridor. No respiratory distress. He has no wheezes.  Decreased breath sounds throughout. No wheezes or crackles appreciated.  Abdominal: Soft. He exhibits no distension. There is no tenderness. There is no rebound and no guarding.  Musculoskeletal: He exhibits no edema.  Neurological: He is alert and oriented to person, place, and time.  Skin: Skin is warm and dry.    Lab Results: Basic Metabolic Panel:  Recent Labs Lab 06/23/15 0615 06/24/15 0215  NA 138 137  K 4.3 4.7  CL 103 106  CO2 22 22  GLUCOSE 122* 135*  BUN 11 12  CREATININE 0.85 0.85  CALCIUM 8.8* 8.1*  MG 1.7  --    Liver Function Tests:  Recent Labs Lab 06/23/15 0615  AST 48*  ALT 39  ALKPHOS 76  BILITOT 0.6  PROT 7.4  ALBUMIN 3.1*   No results for input(s): LIPASE, AMYLASE in the last 168 hours. No results for input(s): AMMONIA in the last 168 hours. CBC:  Recent  Labs Lab 06/23/15 0615 06/24/15 0215  WBC 7.3 6.1  HGB 13.9 12.4*  HCT 43.4 39.3  MCV 98.0 99.0  PLT 139* 123*   Cardiac Enzymes:  Recent Labs Lab 06/23/15 0615  TROPONINI 0.03   BNP: No results for input(s): PROBNP in the last 168 hours. D-Dimer: No results for input(s): DDIMER in the last 168 hours. CBG: No results for input(s): GLUCAP in the last 168 hours. Hemoglobin A1C: No results for input(s): HGBA1C in the last 168 hours. Fasting Lipid Panel: No results for input(s): CHOL, HDL, LDLCALC, TRIG, CHOLHDL, LDLDIRECT in the last 168 hours. Thyroid Function Tests:  Recent Labs Lab 06/23/15 0615  TSH 0.787   Coagulation: No results for input(s): LABPROT, INR in the last 168 hours. Anemia Panel: No results for input(s): VITAMINB12, FOLATE, FERRITIN, TIBC, IRON, RETICCTPCT in the last 168 hours. Urine Drug Screen: Drugs of Abuse     Component Value Date/Time   LABOPIA NONE DETECTED 06/23/2015 0810   COCAINSCRNUR NONE DETECTED 06/23/2015 0810   LABBENZ POSITIVE* 06/23/2015 0810   AMPHETMU NONE DETECTED 06/23/2015 0810   THCU POSITIVE* 06/23/2015 0810   LABBARB NONE DETECTED 06/23/2015 0810    Alcohol Level: No results for input(s): ETH in the last 168 hours. Urinalysis:  Recent Labs  Lab 06/23/15 1210  COLORURINE YELLOW  LABSPEC >1.046*  PHURINE 6.0  GLUCOSEU NEGATIVE  HGBUR SMALL*  BILIRUBINUR NEGATIVE  KETONESUR NEGATIVE  PROTEINUR NEGATIVE  NITRITE NEGATIVE  LEUKOCYTESUR NEGATIVE   Misc. Labs:   Micro Results: No results found for this or any previous visit (from the past 240 hour(s)). Studies/Results: Ct Angio Chest Pe W/cm &/or Wo Cm  06/23/2015  CLINICAL DATA:  Shortness of breath and chest pain, initial encounter EXAM: CT ANGIOGRAPHY CHEST WITH CONTRAST TECHNIQUE: Multidetector CT imaging of the chest was performed using the standard protocol during bolus administration of intravenous contrast. Multiplanar CT image reconstructions and MIPs  were obtained to evaluate the vascular anatomy. CONTRAST:  100 mL Isovue 370. COMPARISON:  Plain film from earlier in the same day. FINDINGS: Biapical scarring with calcifications are identified. Diffuse emphysematous changes are noted within both lungs. Infiltrate is noted in the lower lobe anteriorly on the left consistent with that seen on the prior exam but lying within the lower lobe as opposed to the lingula. Small pleural effusion is noted. Some mild posterior lower lobe infiltrate is seen. Scattered changes are noted within the right lung in a patchy area of somewhat infiltrative changes noted in best seen on image number 49 of series 1006. The thoracic inlet is within normal limits. The thoracic aorta shows calcifications. No aneurysmal dilatation or definitive dissection is seen. Pulmonary artery demonstrates a normal branching pattern. No filling defects are identified. Some calcified lymph nodes are noted as well as some calcified at mediastinal lymph nodes in the subcarinal region. The most prominent right hilar lymph node measures approximately 14 mm in short axis. Coronary calcifications are seen. Scanning into the upper abdomen shows renal cystic change on the left. The osseous structures show diffuse osteopenia. Motion artifact somewhat limits evaluation of the rib cage. No acute appearing compression deformities are noted. Mild compression deformity is seen at T8 and T12. Review of the MIP images confirms the above findings. IMPRESSION: No definitive pulmonary emboli. Patchy infiltrates bilaterally worse in the left lower lobe. Followup PA and lateral chest X-ray is recommended in 3-4 weeks following trial of antibiotic therapy to ensure resolution and exclude underlying malignancy. Changes consistent with prior granulomatous disease. Electronically Signed   By: Alcide Clever M.D.   On: 06/23/2015 09:45   Dg Chest Portable 1 View  06/23/2015  CLINICAL DATA:  Mid chest and epigastric pain today.  Initial encounter. EXAM: PORTABLE CHEST 1 VIEW COMPARISON:  None. FINDINGS: The lungs are emphysematous. Focal airspace disease is seen in the lingula. Multiple calcified granulomata are seen in the left upper lobe. A small cluster of calcifications is also seen in the right upper lobe. Heart size is enlarged. No pneumothorax or pleural effusion. IMPRESSION: Lingular airspace disease worrisome for pneumonia. Recommend followup to clearing. Emphysema. Old granulomatous disease. Electronically Signed   By: Drusilla Kanner M.D.   On: 06/23/2015 07:24   Medications: I have reviewed the patient's current medications. Scheduled Meds: . azithromycin  500 mg Intravenous Q24H  . cefTRIAXone (ROCEPHIN)  IV  1 g Intravenous Q24H  . enoxaparin (LOVENOX) injection  40 mg Subcutaneous Q24H  . Melatonin  3 mg Oral QHS  . metoprolol tartrate  12.5 mg Oral BID  . pantoprazole  40 mg Oral Daily  . sodium chloride flush  3 mL Intravenous Q12H   Continuous Infusions:  PRN Meds:.acetaminophen, docusate sodium, guaiFENesin-codeine, ipratropium-albuterol Assessment/Plan: Active Problems:   Atrial fibrillation with RVR (HCC)   Pressure ulcer  [name] is a 70 y.o. male w/ PMHx of COPD, Chronic Hep C, PVD s/p RLE AKA, and tobacco abuse, admitted for pneumonia.   Shortness of Breath: Patient with cough, productive of yellow sputum, SOB, fevers at home, and left lower lobe/lingula infiltrate on CXR.  DDx includes CAP vs COPD Exac vs afib vs CHF vs malignancy.  Although procalcitonin is negative and no leukocytosis, there is a definite infiltrate on CXR and CT.  He has a diagnosis of COPD (PFTs unavailable), and while I did not appreciate wheezes, Dr. Rogelia Boga did hear them.  He has orthopnea, elevated BNP, and effusion on CT, but effusion is unilateral (on side of infiltrate), but he has no LE edema or PND.  He also presented in afib with RVR, though again, effusion was unilateral.  Presentation likely complicated by PAH  2/2 chronic lung disease.  No PE on CTA.  Patient reports mild improvement overnight on antibiotics and rate control, maintaining O2 sat 92% on RA at rest.  Will add on steroids due to wheezes and stop CTX.  Azithro will cover atypical PNA and COPD exac.  Willf/u TTE to determine need for diuresis.   - STOP CTX - Azithro - Prednisone 40 mg x5 days - STOP Diltiazem - Metoprolol 12.5 mg BID [ ]  TTE -Duonebs q4h PRN -Guaifenesin-Codeine PRN for cough -Supplemental O2  Afib with RVR, improving: Patient now rate-controlled on Dilt drip and Metop. Will stop Dilt and titrate Metoprolol accordingly.  CHADS-VASc is 2, so patient qualifies for anticoagulation.  Awaiting TTE and will discuss anticoagulation with patient. [ ]  TTE - Metoprolol tartrate 12.5 mg BID  COPD: Patient with extensive bullous disease on CT, states he has been told he has emphysema in the past. Still smokes 1-2 cigs per day. No PFT's given that he receives his care at [facility]  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 Fully treated. Spent 11 days in the hospital and had witnessed medication administration with the Health Department. Obvious granulomatous disease on CTA.  -Obtain records from [facility]  Chronic Hepatitis C: Patient knows he has this. Mild LFT abnormalities. Has discussed treatment at the [place] in the past.  -Further management as an outpatient  Protein Calorie Malnutrition: Patient quite thin, ill-appearing, low albumin.  -Nutrition consult  Hematuria: Patient reported. Small Hgb and 0-5 RBC on U/A. Also with BPH symptoms.  Patient will need Urology f/u after discharge.  -Consider starting Flomax for BPH symptoms  DVT/PE PPx: Lovenox   Dispo: Disposition is deferred at this time, awaiting improvement of current medical problems.  Anticipated discharge in approximately 2-3 day(s).   The patient does have a current PCP (No  primary care provider on file.) and does need an [facility] hospital follow-up appointment after discharge.  The patient does not have transportation limitations that hinder transportation to clinic appointments.  .Services Needed at time of discharge: Y = Yes, Blank = No PT:   OT:   RN:   Equipment:   Other:     LOS: 1 day   Jana Half, MD 06/24/2015, 9:13 AM

## 2015-06-24 NOTE — Progress Notes (Signed)
Patient has been up all night. States that he has not been able to sleep longer than a couple of hours at a time    Advised him i would place a note and let the AM nurse know. I also advised him to let the physician know in the morning.

## 2015-06-24 NOTE — Progress Notes (Signed)
PHARMACIST - PHYSICIAN COMMUNICATION  CONCERNING: Antibiotic IV to Oral Route Change Policy  RECOMMENDATION: This patient is receiving Azithromycin by the intravenous route.  Based on criteria approved by the Pharmacy and Therapeutics Committee, the antibiotic(s) is/are being converted to the equivalent oral dose form(s).   DESCRIPTION: These criteria include:  Patient being treated for a respiratory tract infection, urinary tract infection, cellulitis or clostridium difficile associated diarrhea if on metronidazole  The patient is not neutropenic and does not exhibit a GI malabsorption state  The patient is eating (either orally or via tube) and/or has been taking other orally administered medications for a least 24 hours  The patient is improving clinically and has a Tmax < 100.5  If you have questions about this conversion, please contact the Pharmacy Department  []   [phone] )  [facility] []   [phone] )  [facility] [x]   [phone] )  [facility] []   [phone] )  [facility] []   [phone] )  [facility]   Thank you, Harland German, [place] D 06/24/2015 12:13 PM

## 2015-06-24 NOTE — Progress Notes (Signed)
Initial Nutrition Assessment  DOCUMENTATION CODES:   Severe malnutrition in context of chronic illness, Underweight  INTERVENTION:   Ensure Enlive po BID, each supplement provides 350 kcal and 20 grams of protein  NUTRITION DIAGNOSIS:   Increased nutrient needs related to chronic illness as evidenced by estimated needs  GOAL:   Patient will meet greater than or equal to 90% of their needs  MONITOR:   PO intake, Supplement acceptance, Labs, Weight trends, I & O's  REASON FOR ASSESSMENT:   Consult Assessment of nutrition requirement/status  ASSESSMENT:   70 y.o. Male w/ PMHx of COPD, Chronic Hep C, PVD s/p RLE AKA, and tobacco abuse, admitted for LML pneumonia.   Pt not very conversant upon visit. Unable to obtain much nutrition hx. PO intake 50-100% per flowsheet records. Pt with visible severe muscle and fat loss to upper body. Would benefit from addition of oral nutrition supplements.  Nutrition-Focused physical exam completed. Findings are severe fat depletion, severe muscle depletion, and no edema.   Diet Order:  Diet Heart Room service appropriate?: Yes; Fluid consistency:: Thin  Skin:  Wound (see comment) (Stage I to coccyx)  Last BM:  4/25  Height:   Ht Readings from Last 1 Encounters:  06/24/15 6' (1.829 m)    Weight:   Wt Readings from Last 1 Encounters:  06/24/15 110 lb (49.896 kg)    Ideal Body Weight:  72.8 kg  BMI:  16.4 kg/m2 -- adjusted for AKA  Estimated Nutritional Needs:   Kcal:  1500-1700  Protein:  70-80 gm  Fluid:  1.5-1.7 L  EDUCATION NEEDS:   No education needs identified at this time  Maureen Chatters, RD, LDN Pager #: [phone] After-Hours Pager #: [phone]

## 2015-06-25 ENCOUNTER — Encounter (HOSPITAL_COMMUNITY)
Admission: EM | Disposition: A | Discharge status: Home / Self Care | Payer: Self-pay | Source: Home / Self Care | Attending: Internal Medicine

## 2015-06-25 DIAGNOSIS — I4891 Unspecified atrial fibrillation: Secondary | ICD-10-CM

## 2015-06-25 DIAGNOSIS — I2510 Atherosclerotic heart disease of native coronary artery without angina pectoris: Secondary | ICD-10-CM

## 2015-06-25 DIAGNOSIS — I5021 Acute systolic (congestive) heart failure: Secondary | ICD-10-CM

## 2015-06-25 DIAGNOSIS — I5023 Acute on chronic systolic (congestive) heart failure: Secondary | ICD-10-CM | POA: Insufficient documentation

## 2015-06-25 HISTORY — PX: CARDIAC CATHETERIZATION: SHX172

## 2015-06-25 LAB — POCT I-STAT 3, ART BLOOD GAS (G3+)
Acid-Base Excess: 2 mmol/L (ref 0.0–2.0)
BICARBONATE: 26.2 meq/L — AB (ref 20.0–24.0)
O2 SAT: 93 %
PCO2 ART: 38.5 mmHg (ref 35.0–45.0)
TCO2: 27 mmol/L (ref 0–100)
pH, Arterial: 7.44 (ref 7.350–7.450)
pO2, Arterial: 66 mmHg — ABNORMAL LOW (ref 80.0–100.0)

## 2015-06-25 LAB — POCT I-STAT 3, VENOUS BLOOD GAS (G3P V)
ACID-BASE EXCESS: 1 mmol/L (ref 0.0–2.0)
Acid-Base Excess: 3 mmol/L — ABNORMAL HIGH (ref 0.0–2.0)
BICARBONATE: 25.8 meq/L — AB (ref 20.0–24.0)
BICARBONATE: 27.9 meq/L — AB (ref 20.0–24.0)
O2 Saturation: 65 %
O2 Saturation: 65 %
PCO2 VEN: 44.8 mmHg — AB (ref 45.0–50.0)
PH VEN: 7.402 — AB (ref 7.250–7.300)
PO2 VEN: 34 mmHg (ref 31.0–45.0)
PO2 VEN: 34 mmHg (ref 31.0–45.0)
TCO2: 27 mmol/L (ref 0–100)
TCO2: 29 mmol/L (ref 0–100)
pCO2, Ven: 41.5 mmHg — ABNORMAL LOW (ref 45.0–50.0)
pH, Ven: 7.401 — ABNORMAL HIGH (ref 7.250–7.300)

## 2015-06-25 LAB — CBC WITH DIFFERENTIAL/PLATELET
Basophils Absolute: 0 10*3/uL (ref 0.0–0.1)
Basophils Relative: 0 %
EOS ABS: 0 10*3/uL (ref 0.0–0.7)
Eosinophils Relative: 0 %
HCT: 34.5 % — ABNORMAL LOW (ref 39.0–52.0)
Hemoglobin: 11.2 g/dL — ABNORMAL LOW (ref 13.0–17.0)
LYMPHS PCT: 13 %
Lymphs Abs: 0.6 10*3/uL — ABNORMAL LOW (ref 0.7–4.0)
MCH: 31.5 pg (ref 26.0–34.0)
MCHC: 32.5 g/dL (ref 30.0–36.0)
MCV: 97.2 fL (ref 78.0–100.0)
Monocytes Absolute: 0.4 10*3/uL (ref 0.1–1.0)
Monocytes Relative: 9 %
Neutro Abs: 3.7 10*3/uL (ref 1.7–7.7)
Neutrophils Relative %: 78 %
Platelets: 130 10*3/uL — ABNORMAL LOW (ref 150–400)
RBC: 3.55 MIL/uL — ABNORMAL LOW (ref 4.22–5.81)
RDW: 14.5 % (ref 11.5–15.5)
WBC: 4.7 10*3/uL (ref 4.0–10.5)

## 2015-06-25 LAB — BASIC METABOLIC PANEL
Anion gap: 8 (ref 5–15)
BUN: 13 mg/dL (ref 6–20)
CO2: 24 mmol/L (ref 22–32)
Calcium: 8.1 mg/dL — ABNORMAL LOW (ref 8.9–10.3)
Chloride: 106 mmol/L (ref 101–111)
Creatinine, Ser: 0.79 mg/dL (ref 0.61–1.24)
GFR calc non Af Amer: >60 mL/min (ref >60)
Glucose, Bld: 149 mg/dL — ABNORMAL HIGH (ref 65–99)
POTASSIUM: 4.1 mmol/L (ref 3.5–5.1)
SODIUM: 138 mmol/L (ref 135–145)

## 2015-06-25 LAB — PROTIME-INR
INR: 1.17 (ref 0.00–1.49)
Prothrombin Time: 15 seconds (ref 11.6–15.2)

## 2015-06-25 LAB — TSH: TSH: 0.709 u[IU]/mL (ref 0.350–4.500)

## 2015-06-25 SURGERY — RIGHT/LEFT HEART CATH AND CORONARY ANGIOGRAPHY
Anesthesia: LOCAL

## 2015-06-25 MED ORDER — APIXABAN 5 MG PO TABS
5.0000 mg | ORAL_TABLET | ORAL | Status: AC
  Administered 2015-06-25: 5 mg via ORAL
  Filled 2015-06-25: qty 1

## 2015-06-25 MED ORDER — SENNOSIDES-DOCUSATE SODIUM 8.6-50 MG PO TABS
2.0000 | ORAL_TABLET | Freq: Two times a day (BID) | ORAL | Status: DC
  Administered 2015-06-25 – 2015-06-28 (×5): 2 via ORAL
  Filled 2015-06-25 (×6): qty 2

## 2015-06-25 MED ORDER — FENTANYL CITRATE (PF) 100 MCG/2ML IJ SOLN
INTRAMUSCULAR | Status: DC | PRN
  Administered 2015-06-25: 25 ug via INTRAVENOUS

## 2015-06-25 MED ORDER — SODIUM CHLORIDE 0.9% FLUSH: 3.0000 mL | INTRAVENOUS | Status: DC | PRN

## 2015-06-25 MED ORDER — SODIUM CHLORIDE 0.9 % IV SOLN: 250.0000 mL | INTRAVENOUS | Status: DC | PRN

## 2015-06-25 MED ORDER — SODIUM CHLORIDE 0.9 % IV SOLN
INTRAVENOUS | Status: DC
Start: 2015-06-26 — End: 2015-06-25

## 2015-06-25 MED ORDER — FENTANYL CITRATE (PF) 100 MCG/2ML IJ SOLN
INTRAMUSCULAR | Status: AC
  Filled 2015-06-25: qty 2

## 2015-06-25 MED ORDER — AMIODARONE LOAD VIA INFUSION
150.0000 mg | Freq: Once | INTRAVENOUS | Status: AC
  Administered 2015-06-25: 150 mg via INTRAVENOUS
  Filled 2015-06-25: qty 83.34

## 2015-06-25 MED ORDER — IOPAMIDOL (ISOVUE-370) INJECTION 76%
INTRAVENOUS | Status: AC
  Filled 2015-06-25: qty 100

## 2015-06-25 MED ORDER — HEPARIN SODIUM (PORCINE) 1000 UNIT/ML IJ SOLN
INTRAMUSCULAR | Status: DC | PRN
  Administered 2015-06-25: 2000 [IU] via INTRAVENOUS

## 2015-06-25 MED ORDER — SODIUM CHLORIDE 0.9% FLUSH
3.0000 mL | Freq: Two times a day (BID) | INTRAVENOUS | Status: DC
Start: 2015-06-25 — End: 2015-06-25

## 2015-06-25 MED ORDER — HEPARIN (PORCINE) IN NACL 2-0.9 UNIT/ML-% IJ SOLN
INTRAMUSCULAR | Status: AC
  Filled 2015-06-25: qty 1000

## 2015-06-25 MED ORDER — HEPARIN (PORCINE) IN NACL 2-0.9 UNIT/ML-% IJ SOLN
INTRAMUSCULAR | Status: DC | PRN
  Administered 2015-06-25: 13:00:00 via INTRA_ARTERIAL

## 2015-06-25 MED ORDER — LIDOCAINE HCL (PF) 1 % IJ SOLN
INTRAMUSCULAR | Status: DC | PRN
  Administered 2015-06-25: 5 mL

## 2015-06-25 MED ORDER — CARVEDILOL 3.125 MG PO TABS
3.1250 mg | ORAL_TABLET | Freq: Two times a day (BID) | ORAL | Status: DC
  Administered 2015-06-26: 3.125 mg via ORAL
  Filled 2015-06-25: qty 1

## 2015-06-25 MED ORDER — AMIODARONE HCL IN DEXTROSE 360-4.14 MG/200ML-% IV SOLN
30.0000 mg/h | INTRAVENOUS | Status: DC
  Administered 2015-06-25 – 2015-06-27 (×4): 30 mg/h via INTRAVENOUS
  Filled 2015-06-25 (×4): qty 200

## 2015-06-25 MED ORDER — ONDANSETRON HCL 4 MG/2ML IJ SOLN: 4.0000 mg | Freq: Four times a day (QID) | INTRAMUSCULAR | Status: DC | PRN

## 2015-06-25 MED ORDER — FUROSEMIDE 10 MG/ML IJ SOLN
20.0000 mg | Freq: Two times a day (BID) | INTRAMUSCULAR | Status: DC
  Administered 2015-06-25 – 2015-06-26 (×2): 20 mg via INTRAVENOUS
  Filled 2015-06-25 (×2): qty 2

## 2015-06-25 MED ORDER — SODIUM CHLORIDE 0.9% FLUSH
3.0000 mL | Freq: Two times a day (BID) | INTRAVENOUS | Status: DC
  Administered 2015-06-25: 3 mL via INTRAVENOUS

## 2015-06-25 MED ORDER — CARVEDILOL 3.125 MG PO TABS
3.1250 mg | ORAL_TABLET | Freq: Once | ORAL | Status: AC
  Administered 2015-06-25: 3.125 mg via ORAL
  Filled 2015-06-25: qty 1

## 2015-06-25 MED ORDER — SODIUM CHLORIDE 0.9 % IV SOLN
INTRAVENOUS | Status: AC
  Administered 2015-06-25: 14:00:00 via INTRAVENOUS

## 2015-06-25 MED ORDER — VERAPAMIL HCL 2.5 MG/ML IV SOLN
INTRAVENOUS | Status: AC
  Filled 2015-06-25: qty 2

## 2015-06-25 MED ORDER — HEPARIN SODIUM (PORCINE) 1000 UNIT/ML IJ SOLN
INTRAMUSCULAR | Status: AC
  Filled 2015-06-25: qty 1

## 2015-06-25 MED ORDER — HEPARIN (PORCINE) IN NACL 2-0.9 UNIT/ML-% IJ SOLN
INTRAMUSCULAR | Status: DC | PRN
  Administered 2015-06-25: 14:00:00

## 2015-06-25 MED ORDER — MIDAZOLAM HCL 2 MG/2ML IJ SOLN
INTRAMUSCULAR | Status: AC
  Filled 2015-06-25: qty 2

## 2015-06-25 MED ORDER — APIXABAN 5 MG PO TABS
5.0000 mg | ORAL_TABLET | Freq: Two times a day (BID) | ORAL | Status: DC
  Administered 2015-06-26 – 2015-06-28 (×6): 5 mg via ORAL
  Filled 2015-06-25 (×6): qty 1

## 2015-06-25 MED ORDER — POLYETHYLENE GLYCOL 3350 17 G PO PACK
17.0000 g | PACK | Freq: Every day | ORAL | Status: DC
  Administered 2015-06-26 – 2015-06-28 (×2): 17 g via ORAL
  Filled 2015-06-25 (×3): qty 1

## 2015-06-25 MED ORDER — LIDOCAINE HCL (PF) 1 % IJ SOLN
INTRAMUSCULAR | Status: AC
  Filled 2015-06-25: qty 30

## 2015-06-25 MED ORDER — FUROSEMIDE 10 MG/ML IJ SOLN
20.0000 mg | Freq: Once | INTRAMUSCULAR | Status: AC
  Administered 2015-06-25: 20 mg via INTRAVENOUS
  Filled 2015-06-25: qty 2

## 2015-06-25 MED ORDER — AMIODARONE HCL IN DEXTROSE 360-4.14 MG/200ML-% IV SOLN
60.0000 mg/h | INTRAVENOUS | Status: DC
  Administered 2015-06-25: 60 mg/h via INTRAVENOUS
  Filled 2015-06-25: qty 200

## 2015-06-25 MED ORDER — MIDAZOLAM HCL 2 MG/2ML IJ SOLN
INTRAMUSCULAR | Status: DC | PRN
  Administered 2015-06-25: 1 mg via INTRAVENOUS

## 2015-06-25 MED ORDER — IOPAMIDOL (ISOVUE-370) INJECTION 76%
INTRAVENOUS | Status: DC | PRN
  Administered 2015-06-25: 50 mL via INTRA_ARTERIAL

## 2015-06-25 MED ORDER — ACETAMINOPHEN 325 MG PO TABS
650.0000 mg | ORAL_TABLET | ORAL | Status: DC | PRN
  Administered 2015-06-27: 650 mg via ORAL

## 2015-06-25 SURGICAL SUPPLY — 13 items
CATH BALLN WEDGE 5F 110CM (CATHETERS) ×1 IMPLANT
CATH INFINITI 5 FR JL3.5 (CATHETERS) ×1 IMPLANT
CATH INFINITI JR4 5F (CATHETERS) ×1 IMPLANT
DEVICE RAD COMP TR BAND LRG (VASCULAR PRODUCTS) ×1 IMPLANT
GLIDESHEATH SLEND SS 6F .021 (SHEATH) ×1 IMPLANT
KIT HEART LEFT (KITS) ×2 IMPLANT
KIT HEART RIGHT NAMIC (KITS) ×1 IMPLANT
PACK CARDIAC CATHETERIZATION (CUSTOM PROCEDURE TRAY) ×2 IMPLANT
SHEATH FAST CATH BRACH 5F 5CM (SHEATH) ×1 IMPLANT
SYR MEDRAD MARK V 150ML (SYRINGE) ×1 IMPLANT
TRANSDUCER W/STOPCOCK (MISCELLANEOUS) ×3 IMPLANT
TUBING CIL FLEX 10 FLL-RA (TUBING) ×2 IMPLANT
WIRE SAFE-T 1.5MM-J .035X260CM (WIRE) ×1 IMPLANT

## 2015-06-25 NOTE — Consult Note (Addendum)
Advanced Heart Failure Team Consult Note  Referring Physician: Blanch Media, MD Primary Physician: None Primary Cardiologist:  None  Reason for Consultation: Newly diagnosed systolic CHF  HPI:    [name] is a 70 y.o. with PMH of PAF, COPD, Chronic Hep C, h/o TB (Diagnosed ~ 2012 or so in [place], [place] at the [place]), PVD s/p RLE AKA,  and tobacco abuse who presented to [facility] with worsening SOB and cough for several months. Recently developed productive cough with yellow sputum. Felt like he had gained 10-15 lbs in the past few weeks. [facility] and ran out of some of his COPD meds.  Of note, did complete full course of RIPE therapy for TB. Spent 11 days in hospital and had supervised administration at home of his other medications doses by the health department.   Pertinent admission labs include K 4.3, Creatinine 0.85, BNP 524.4 and negative troponin. CXR with lingular airspace disease worrisome for PNA. Emphysema changes and old granulomatous disease. CT angio with no definitive PE but changes consistent with prior granulomatous disease.  Echo 06/24/15 LVEF 15-20%, Mod MR, Severe LAE, Normal RV, Mild TR, PA peak pressure  Feeling OK today.  States he gets SOB with even minimal exertion, like getting out of bed. Also feels his HR beating fast, especially with exertion.  Has never been told he has a weak heart or irregular rhythm.  Has occasionally atypical chest pain that is somewhat relieved with tylenol +/- rest, but also happens occasionally at rest. Denies fevers or chills. Occasionally does get SOB at rest with his COPD.   Review of Systems: [y] = yes, [ ]  = no   General: Weight gain [y]; Weight loss [ ] ; Anorexia [ ] ; Fatigue [y]; Fever [ ] ; Chills [ ] ; Weakness [ ]   Cardiac: Chest pain/pressure [y]; Resting SOB [y]; Exertional SOB [y]; Orthopnea [ ] ; Pedal Edema [ ] ; Palpitations [ ] ; Syncope [ ] ; Presyncope [ ] ; Paroxysmal nocturnal dyspnea[ ]     Pulmonary: Cough [y]; Wheezing[ ] ; Hemoptysis[ ] ; Sputum [y]; Snoring [ ]   GI: Vomiting[ ] ; Dysphagia[ ] ; Melena[ ] ; Hematochezia [ ] ; Heartburn[ ] ; Abdominal pain [ ] ; Constipation [ ] ; Diarrhea [ ] ; BRBPR [ ]   GU: Hematuria [y]; Dysuria [ ] ; Nocturia[ ]   Vascular: Pain in legs with walking [ ] ; Pain in feet with lying flat [ ] ; Non-healing sores [ ] ; Stroke [ ] ; TIA [ ] ; Slurred speech [ ] ;  Neuro: Headaches[ ] ; Vertigo[ ] ; Seizures[ ] ; Paresthesias[ ] ;Blurred vision [ ] ; Diplopia [ ] ; Vision changes [ ]   Ortho/Skin: Arthritis [y]; Joint pain [y]; Muscle pain [ ] ; Joint swelling [ ] ; Back Pain [ ] ; Rash [ ]   Psych: Depression[ ] ; Anxiety[ ]   Heme: Bleeding problems [ ] ; Clotting disorders [ ] ; Anemia [ ]   Endocrine: Diabetes [ ] ; Thyroid dysfunction[ ]   Home Medications Prior to Admission medications   Not on File    Past Medical History: Past Medical History  Diagnosis Date  . COPD (chronic obstructive pulmonary disease) (HCC)   . PVD (peripheral vascular disease) (HCC)     Past Surgical History: History reviewed. No pertinent past surgical history.  Family History: No family history on file.  Social History: Social History   Social History  . Marital Status: Single    Spouse Name: N/A  . Number of Children: N/A  . Years of Education: N/A   Social History Main Topics  .  Smoking status: Current Some Day Smoker  . Smokeless tobacco: None  . Alcohol Use: No  . Drug Use: Yes    Special: Marijuana  . Sexual Activity: Not Asked   Other Topics Concern  . None   Social History Narrative  . None    Allergies:  No Known Allergies  Objective:    Vital Signs:   Temp:  [97.5 F (36.4 C)-97.8 F (36.6 C)] 97.5 F (36.4 C) (04/28 0511) Pulse Rate:  [78-107] 96 (04/28 0511) Resp:  [18] 18 (04/28 0511) BP: (89-94)/(69-76) 90/76 mmHg (04/28 0511) SpO2:  [90 %-93 %] 93 % (04/28 0511) Weight:  [110 lb (49.896 kg)] 110 lb (49.896 kg) (04/27 1441) Last BM Date:  06/22/15  Weight change: Filed Weights   06/24/15 1441  Weight: 110 lb (49.896 kg)    Intake/Output:   Intake/Output Summary (Last 24 hours) at 06/25/15 1004 Last data filed at 06/24/15 1900  Gross per 24 hour  Intake    120 ml  Output    300 ml  Net   -180 ml     Physical Exam: General:  Elderly and thin, nearly cachetic.  HEENT: normal Neck: supple. JVP 7-8 vs superficial vascularity. Carotids 2+ bilat; no bruits. No lymphadenopathy or thyromegaly appreciated. Cor: PMI nondisplaced. Irregularly irregular and tachy, No M/G/R appreciated but difficult with fast rate. Lungs: Scattered rhonchi that clear with cough.  Abdomen: soft, nontender, nondistended. No hepatosplenomegaly. No bruits or masses. Good bowel sounds. Extremities: no cyanosis, clubbing, rash, edema. S/p R AKA.  Neuro: alert & orientedx3, cranial nerves grossly intact. moves all 4 extremities w/o difficulty. Affect pleasant  Telemetry: Afib RVR in 120-140s  Labs: Basic Metabolic Panel:  Recent Labs Lab 06/23/15 0615 06/24/15 0215 06/25/15 0330  NA 138 137 138  K 4.3 4.7 4.1  CL 103 106 106  CO2 22 22 24   GLUCOSE 122* 135* 149*  BUN 11 12 13   CREATININE 0.85 0.85 0.79  CALCIUM 8.8* 8.1* 8.1*  MG 1.7  --   --     Liver Function Tests:  Recent Labs Lab 06/23/15 0615  AST 48*  ALT 39  ALKPHOS 76  BILITOT 0.6  PROT 7.4  ALBUMIN 3.1*   No results for input(s): LIPASE, AMYLASE in the last 168 hours. No results for input(s): AMMONIA in the last 168 hours.  CBC:  Recent Labs Lab 06/23/15 0615 06/24/15 0215 06/25/15 0330  WBC 7.3 6.1 4.7  NEUTROABS  --   --  3.7  HGB 13.9 12.4* 11.2*  HCT 43.4 39.3 34.5*  MCV 98.0 99.0 97.2  PLT 139* 123* 130*    Cardiac Enzymes:  Recent Labs Lab 06/23/15 0615  TROPONINI 0.03    BNP: BNP (last 3 results)  Recent Labs  06/23/15 0615  BNP 524.4*    ProBNP (last 3 results) No results for input(s): PROBNP in the last 8760  hours.   CBG: No results for input(s): GLUCAP in the last 168 hours.  Coagulation Studies: No results for input(s): LABPROT, INR in the last 72 hours.  Other results: EKG: 06/24/15 Afib RVR in 180s, ? Old anterior infarct  Imaging:  No results found.   Medications:     Current Medications: . azithromycin  500 mg Oral Daily  . enoxaparin (LOVENOX) injection  40 mg Subcutaneous Q24H  . feeding supplement (ENSURE ENLIVE)  237 mL Oral BID BM  . furosemide  20 mg Intravenous Once  . Melatonin  3 mg Oral QHS  .  metoprolol tartrate  12.5 mg Oral BID  . pantoprazole  40 mg Oral Daily  . polyethylene glycol  17 g Oral Daily  . predniSONE  40 mg Oral Q breakfast  . senna-docusate  2 tablet Oral BID  . sodium chloride flush  3 mL Intravenous Q12H  . traZODone  50 mg Oral BID     Infusions:      Assessment   1. Acute systolic HF - Newly reduced EF - Echo 06/24/15 LVEF 15-20%, Mod MR, Severe LAE, Normal RV, Mild TR, PA peak pressure 2. A fib with RVR, unclear chronicity 3. HTN 4. CAP 5. COPD with ongoing tobacco use 6. H/o TB 7. Chronic Hep C - Has not had treatment.  8. PAD with R AKA  Plan    Unclear etiology for reduced EF. Possibly tachy-medicated from uncontrolled Afib.  No ischemic workup, but no clear ACS symptoms.   Low suspicion for severe CAD but may have some degree. Will assess further with R/LHC this afternoon.   Was in afib RVR at rate of 180s on admit. Currently in 120s-130s. Currently on DVT PPx on with lovenox. This patients CHA2DS2-VASc Score and unadjusted Ischemic Stroke Rate (% per year) is at least 7.2 % stroke rate/year from a score of 5 (HF, HTN, Age, PVD). Will need anticoag for home. Worry about compliance.   Will try on short term amio and start heparin s/p if LHC.   If he doesn't convert chemically, will need TEE/DCCV early next week.   Length of Stay: 2  James Freer PA-C 06/25/2015, 10:04 AM  Advanced Heart Failure  Team Pager [phone] (M-F; 7a - 4p)  Please contact [facility] for night-coverage after hours (4p -7a ) and weekends on [email or web address]   Patient seen and examined with James Saber, PA-C. We discussed all aspects of the encounter. I agree with the assessment and plan as stated above.   He has severe LV dysfunction in setting of multiple cardiac risk factors and AF with RVR. 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 That said he likely has severe CAD. Will plan cath today. Then start amio and heparin. If remains in AF with plan TEE/DC-CV Monday. Given severe COPD would like to limit amio therapy to just a few months if possible.   James Schwartz,MD 12:50 PM

## 2015-06-25 NOTE — Progress Notes (Signed)
ANTICOAGULATION CONSULT NOTE - Initial Consult  Pharmacy Consult for Apixaban Indication: atrial fibrillation  No Known Allergies  Patient Measurements: Height: 6' (182.9 cm) Weight: 110 lb (49.896 kg) IBW/kg (Calculated) : 77.6   Vital Signs: Temp: 97.5 F (36.4 C) (04/28 0511) Temp Source: Oral (04/28 0511) BP: 117/77 mmHg (04/28 1354) Pulse Rate: 111 (04/28 1354)  Labs:  Recent Labs  06/23/15 0615 06/24/15 0215 06/25/15 0330 06/25/15 1225  HGB 13.9 12.4* 11.2*  --   HCT 43.4 39.3 34.5*  --   PLT 139* 123* 130*  --   LABPROT  --   --   --  15.0  INR  --   --   --  1.17  CREATININE 0.85 0.85 0.79  --   TROPONINI 0.03  --   --   --     Estimated Creatinine Clearance: 60.6 mL/min (by C-G formula based on Cr of 0.79).   Medical History: Past Medical History  Diagnosis Date  . COPD (chronic obstructive pulmonary disease) (HCC)   . PVD (peripheral vascular disease) (HCC)      Assessment: 69yom admitted with SOB started on Azithromycin for possible pna.  Found to be in Afib with RVR and new low EF. Metoprolol started for rate control changed to amiodarone and coreg (EF 15%) Plan to anticoagulate with apixaban and possible TEE/DCCV next week.   Wt < 60kg, Cr 0.7, age > 80  Goal of Therapy:   Monitor platelets by anticoagulation protocol: Yes   Plan:  Apixaban 5mg  BID Monitor s/s bleeding Monitor renal fx and CBC  Leota Sauers Pharm.D. CPP, BCPS Clinical Pharmacist [phone] 06/25/2015 3:03 PM

## 2015-06-25 NOTE — Progress Notes (Signed)
Subjective: [name].  He reports continued improved cough and improved sleep.  He is still unable to lie flat.  His breathing has improved slightly.  Objective: Vital signs in last 24 hours: Filed Vitals:   06/24/15 1420 06/24/15 1441 06/24/15 2016 06/25/15 0511  BP: 89/69  94/72 90/76  Pulse: 78  107 96  Temp: 97.8 F (36.6 C)  97.5 F (36.4 C) 97.5 F (36.4 C)  TempSrc: Oral  Oral Oral  Resp: Height:  6' (1.829 m)    Weight:  110 lb (49.896 kg)    SpO2: 93%  90% 93%   Weight change:   Intake/Output Summary (Last 24 hours) at 06/25/15 1001 Last data filed at 06/24/15 1900  Gross per 24 hour  Intake    120 ml  Output    300 ml  Net   -180 ml   Physical Exam  Constitutional: He is oriented to person, place, and time.  Frail, chronically ill appearing male, sitting up in bed, NAD  HENT:  Head: Normocephalic and atraumatic.  Eyes: EOM are normal. No scleral icterus.  Neck: No tracheal deviation present.  Cardiovascular: Normal rate and normal heart sounds.   Irregularly irregular rhythm.    Pulmonary/Chest: Effort normal. No stridor. No respiratory distress. He has no wheezes.  Decreased breath sounds throughout. Minimal crackles in right base.  No wheezes appreciated.  Abdominal: Soft. He exhibits no distension. There is no tenderness. There is no rebound and no guarding.  Musculoskeletal: He exhibits no edema.  Neurological: He is alert and oriented to person, place, and time.  Skin: Skin is warm and dry.    Lab Results: Basic Metabolic Panel:  Recent Labs Lab 06/23/15 0615 06/24/15 0215 06/25/15 0330  NA 138 137 138  K 4.3 4.7 4.1  CL 103 106 106  CO2 GLUCOSE 122* 135* 149*  BUN CREATININE 0.85 0.85 0.79  CALCIUM 8.8* 8.1* 8.1*  MG 1.7  --   --    Liver Function Tests:  Recent Labs Lab 06/23/15 0615  AST 48*  ALT 39  ALKPHOS 76  BILITOT 0.6  PROT 7.4  ALBUMIN 3.1*   No results for input(s): LIPASE, AMYLASE in  the last 168 hours. No results for input(s): AMMONIA in the last 168 hours. CBC:  Recent Labs Lab 06/24/15 0215 06/25/15 0330  WBC 6.1 4.7  NEUTROABS  --  3.7  HGB 12.4* 11.2*  HCT 39.3 34.5*  MCV 99.0 97.2  PLT 123* 130*   Cardiac Enzymes:  Recent Labs Lab 06/23/15 0615  TROPONINI 0.03   BNP: No results for input(s): PROBNP in the last 168 hours. D-Dimer: No results for input(s): DDIMER in the last 168 hours. CBG: No results for input(s): GLUCAP in the last 168 hours. Hemoglobin A1C: No results for input(s): HGBA1C in the last 168 hours. Fasting Lipid Panel: No results for input(s): CHOL, HDL, LDLCALC, TRIG, CHOLHDL, LDLDIRECT in the last 168 hours. Thyroid Function Tests:  Recent Labs Lab 06/23/15 0615  TSH 0.787   Coagulation: No results for input(s): LABPROT, INR in the last 168 hours. Anemia Panel: No results for input(s): VITAMINB12, FOLATE, FERRITIN, TIBC, IRON, RETICCTPCT in the last 168 hours. Urine Drug Screen: Drugs of Abuse     Component Value Date/Time   LABOPIA NONE DETECTED 06/23/2015 0810   COCAINSCRNUR NONE DETECTED 06/23/2015 0810   LABBENZ POSITIVE* 06/23/2015 0810   AMPHETMU NONE DETECTED 06/23/2015 0810  THCU POSITIVE* 06/23/2015 0810   LABBARB NONE DETECTED 06/23/2015 0810    Alcohol Level: No results for input(s): ETH in the last 168 hours. Urinalysis:  Recent Labs Lab 06/23/15 1210  COLORURINE YELLOW  LABSPEC >1.046*  PHURINE 6.0  GLUCOSEU NEGATIVE  HGBUR SMALL*  BILIRUBINUR NEGATIVE  KETONESUR NEGATIVE  PROTEINUR NEGATIVE  NITRITE NEGATIVE  LEUKOCYTESUR NEGATIVE   Misc. Labs:   Micro Results: No results found for this or any previous visit (from the past 240 hour(s)). Studies/Results: No results found. Medications: I have reviewed the patient's current medications. Scheduled Meds: . azithromycin  500 mg Oral Daily  . enoxaparin (LOVENOX) injection  40 mg Subcutaneous Q24H  . feeding supplement (ENSURE ENLIVE)   237 mL Oral BID BM  . furosemide  20 mg Intravenous Once  . Melatonin  3 mg Oral QHS  . metoprolol tartrate  12.5 mg Oral BID  . pantoprazole  40 mg Oral Daily  . polyethylene glycol  17 g Oral Daily  . predniSONE  40 mg Oral Q breakfast  . senna-docusate  2 tablet Oral BID  . sodium chloride flush  3 mL Intravenous Q12H  . traZODone  50 mg Oral BID   Continuous Infusions:  PRN Meds:.acetaminophen, guaiFENesin-codeine, ipratropium-albuterol Assessment/Plan: Active Problems:   Atrial fibrillation with RVR (HCC)   Pressure ulcer   Protein-calorie malnutrition, severe  Mr. Gerrits is a 70 y.o. male w/ PMHx of COPD, Chronic Hep C, PVD s/p RLE AKA, and tobacco abuse, admitted for shortness of breath.   Shortness of Breath: Patient with cough, productive of yellow sputum, SOB, fevers at home, and left lower lobe/lingula infiltrate on CXR.  DDx includesAfib and CHF vs COPD exac vs malignancy vs CAP.  Although procalcitonin is negative and no leukocytosis, there is a definite infiltrate on CXR and CT.  He has orthopnea, elevated BNP, effusion on CT, and EF 15-20% on TTE.  However, effusion is unilateral (on side of infiltrate) and he has no peripheral edema.  He also presented in afib with RVR, though again, effusion was unilateral.  Presentation likely complicated by PAH 2/2 chronic lung disease, with PA pressure 36 on TTE.  Heart failure team is starting ischemic work up and rhythm control, with anticipation of Amiodarone and consideration of cardioversion.  - STOP CTX - Azithro - Prednisone 40 mg x5 days - Metoprolol 12.5 mg BID - Duonebs q4h PRN - Guaifenesin-Codeine PRN for cough - Lasix 20 mg IV once - Heart failure following, appreciate rec's. Now planning LHC/RHC and amiodarone  Afib with RVR, improving: Patient now rate-controlled on Metop. CHADS-VASc is 3 (CHF, Age < 75, and PAD), so patient qualifies for anticoagulation.  He has previously ben on warfarin and would prefer DOAC.   Heart failure following with plan to start Amiodarone.  - Metoprolol tartrate 12.5 mg BID  COPD: Patient with extensive bullous disease on CT, states he has been told he has emphysema in the past. Still smokes 1-2 cigs per day. No PFT's given that he receives his care at [facility].  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 Fully treated. Spent 11 days in the hospital and had witnessed medication administration with the [facility]. Obvious granulomatous disease on CTA.  -Obtain records from [facility]  Chronic Hepatitis C: Patient knows he has this. Mild LFT abnormalities. Has discussed treatment at the [place] in the past.  -Further management as  an outpatient  Protein Calorie Malnutrition: Patient quite thin, ill-appearing, low albumin.  -Nutrition consult  Hematuria: Patient reported. Small Hgb and 0-5 RBC on U/A. Also with BPH symptoms.  Patient will need Urology f/u after discharge.  -Consider starting Flomax for BPH symptoms  DVT/PE PPx: Lovenox [place]  Dispo: Disposition is deferred at this time, awaiting improvement of current medical problems.  Anticipated discharge in approximately 2-3 day(s).   The patient does have a current PCP (No primary care provider on file.) and does need an [facility] hospital follow-up appointment after discharge.  The patient does not have transportation limitations that hinder transportation to clinic appointments.  .Services Needed at time of discharge: Y = Yes, Blank = No PT:   OT:   RN:   Equipment:   Other:     LOS: 2 days   Jana Half, MD 06/25/2015, 10:01 AM

## 2015-06-25 NOTE — Progress Notes (Signed)
PT Cancellation Note  Patient Details Name: [name] MRN: [id number] DOB: [birth year]   Cancelled Treatment:    Reason Eval/Treat Not Completed: Medical issues which prohibited therapy. Pt with high HR earlier and for heart cath later this PM. Will follow up tomorrow.   [name] 06/25/2015, 3:28 PM [facility] PT [phone]

## 2015-06-25 NOTE — Interval H&P Note (Signed)
History and Physical Interval Note:  06/25/2015 12:53 PM  [name]  has presented today for surgery, with the diagnosis of chf  The various methods of treatment have been discussed with the patient and family. After consideration of risks, benefits and other options for treatment, the patient has consented to  Procedure(s): Right/Left Heart Cath and Coronary Angiography (N/A) possible angioplasty as a surgical intervention .  The patient's history has been reviewed, patient examined, no change in status, stable for surgery.  I have reviewed the patient's chart and labs.  Questions were answered to the patient's satisfaction.     Bensimhon, Reuel Boom

## 2015-06-25 NOTE — Progress Notes (Signed)
  Date: 06/25/2015  Patient name: [name]  Medical record number: [id number]  Date of birth: [birth year]   This patient has been seen and the plan of care was discussed with the house staff. Please see their note for complete details. I concur with their findings with the following additions/corrections: Mr Vanella has no specific complaints today. He has decreased air flow but no wheezing today. Able to speak in full sentences. Plan is for R and L cath today. Now on amiodarone and if doesn't convert to sinus HF will cardiovert on the 1st. He will need anticoag at D/C - likely a DOAC. BP may not tolerate other ACE I and other HF meds although he is tolerating BB for rate control now. He will need ASA and statin regardless of cath results 2/2 PAD hx.  Burns Spain, MD 06/25/2015, 1:55 PM

## 2015-06-25 NOTE — Progress Notes (Signed)
  Amiodarone Drug - Drug Interaction Consult Note  Recommendations: No Drug Interactions  Amiodarone is metabolized by the cytochrome P450 system and therefore has the potential to cause many drug interactions. Amiodarone has an average plasma half-life of 50 days (range 20 to 100 days).   There is potential for drug interactions to occur several weeks or months after stopping treatment and the onset of drug interactions may be slow after initiating amiodarone.   []  Statins: Increased risk of myopathy. Simvastatin- restrict dose to 20mg  daily. Other statins: counsel patients to report any muscle pain or weakness immediately.  []  Anticoagulants: Amiodarone can increase anticoagulant effect. Consider warfarin dose reduction. Patients should be monitored closely and the dose of anticoagulant altered accordingly, remembering that amiodarone levels take several weeks to stabilize.  []  Antiepileptics: Amiodarone can increase plasma concentration of phenytoin, the dose should be reduced. Note that small changes in phenytoin dose can result in large changes in levels. Monitor patient and counsel on signs of toxicity.  []  Beta blockers: increased risk of bradycardia, AV block and myocardial depression. Sotalol - avoid concomitant use.  []   Calcium channel blockers (diltiazem and verapamil): increased risk of bradycardia, AV block and myocardial depression.  []   Cyclosporine: Amiodarone increases levels of cyclosporine. Reduced dose of cyclosporine is recommended.  []  Digoxin dose should be halved when amiodarone is started.  []  Diuretics: increased risk of cardiotoxicity if hypokalemia occurs.  []  Oral hypoglycemic agents (glyburide, glipizide, glimepiride): increased risk of hypoglycemia. Patient's glucose levels should be monitored closely when initiating amiodarone therapy.   []  Drugs that prolong the QT interval:  Torsades de pointes risk may be increased with concurrent use - avoid if  possible.  Monitor QTc, also keep magnesium/potassium WNL if concurrent therapy can't be avoided. Marland Kitchen Antibiotics: e.g. fluoroquinolones, erythromycin. . Antiarrhythmics: e.g. quinidine, procainamide, disopyramide, sotalol. . Antipsychotics: e.g. phenothiazines, haloperidol.  . Lithium, tricyclic antidepressants, and methadone. Thank You,  [name]  06/25/2015 3:01 PM

## 2015-06-25 NOTE — H&P (View-Only) (Signed)
Advanced Heart Failure Team Consult Note  Referring Physician: Blanch Media, MD Primary Physician: None Primary Cardiologist:  None  Reason for Consultation: Newly diagnosed systolic CHF  HPI:    [name] is a 70 y.o. with PMH of PAF, COPD, Chronic Hep C, h/o TB (Diagnosed ~ 2012 or so in [place], [place] at the [place]), PVD s/p RLE AKA,  and tobacco abuse who presented to [facility] with worsening SOB and cough for several months. Recently developed productive cough with yellow sputum. Felt like he had gained 10-15 lbs in the past few weeks. [facility] and ran out of some of his COPD meds.  Of note, did complete full course of RIPE therapy for TB. Spent 11 days in hospital and had supervised administration at home of his other medications doses by the health department.   Pertinent admission labs include K 4.3, Creatinine 0.85, BNP 524.4 and negative troponin. CXR with lingular airspace disease worrisome for PNA. Emphysema changes and old granulomatous disease. CT angio with no definitive PE but changes consistent with prior granulomatous disease.  Echo 06/24/15 LVEF 15-20%, Mod MR, Severe LAE, Normal RV, Mild TR, PA peak pressure  Feeling OK today.  States he gets SOB with even minimal exertion, like getting out of bed. Also feels his HR beating fast, especially with exertion.  Has never been told he has a weak heart or irregular rhythm.  Has occasionally atypical chest pain that is somewhat relieved with tylenol +/- rest, but also happens occasionally at rest. Denies fevers or chills. Occasionally does get SOB at rest with his COPD.   Review of Systems: [y] = yes, [ ]  = no   General: Weight gain [y]; Weight loss [ ] ; Anorexia [ ] ; Fatigue [y]; Fever [ ] ; Chills [ ] ; Weakness [ ]   Cardiac: Chest pain/pressure [y]; Resting SOB [y]; Exertional SOB [y]; Orthopnea [ ] ; Pedal Edema [ ] ; Palpitations [ ] ; Syncope [ ] ; Presyncope [ ] ; Paroxysmal nocturnal dyspnea[ ]     Pulmonary: Cough [y]; Wheezing[ ] ; Hemoptysis[ ] ; Sputum [y]; Snoring [ ]   GI: Vomiting[ ] ; Dysphagia[ ] ; Melena[ ] ; Hematochezia [ ] ; Heartburn[ ] ; Abdominal pain [ ] ; Constipation [ ] ; Diarrhea [ ] ; BRBPR [ ]   GU: Hematuria [y]; Dysuria [ ] ; Nocturia[ ]   Vascular: Pain in legs with walking [ ] ; Pain in feet with lying flat [ ] ; Non-healing sores [ ] ; Stroke [ ] ; TIA [ ] ; Slurred speech [ ] ;  Neuro: Headaches[ ] ; Vertigo[ ] ; Seizures[ ] ; Paresthesias[ ] ;Blurred vision [ ] ; Diplopia [ ] ; Vision changes [ ]   Ortho/Skin: Arthritis [y]; Joint pain [y]; Muscle pain [ ] ; Joint swelling [ ] ; Back Pain [ ] ; Rash [ ]   Psych: Depression[ ] ; Anxiety[ ]   Heme: Bleeding problems [ ] ; Clotting disorders [ ] ; Anemia [ ]   Endocrine: Diabetes [ ] ; Thyroid dysfunction[ ]   Home Medications Prior to Admission medications   Not on File    Past Medical History: Past Medical History  Diagnosis Date  . COPD (chronic obstructive pulmonary disease) (HCC)   . PVD (peripheral vascular disease) (HCC)     Past Surgical History: History reviewed. No pertinent past surgical history.  Family History: No family history on file.  Social History: Social History   Social History  . Marital Status: Single    Spouse Name: N/A  . Number of Children: N/A  . Years of Education: N/A   Social History Main Topics  .  Smoking status: Current Some Day Smoker  . Smokeless tobacco: None  . Alcohol Use: No  . Drug Use: Yes    Special: Marijuana  . Sexual Activity: Not Asked   Other Topics Concern  . None   Social History Narrative  . None    Allergies:  No Known Allergies  Objective:    Vital Signs:   Temp:  [97.5 F (36.4 C)-97.8 F (36.6 C)] 97.5 F (36.4 C) (04/28 0511) Pulse Rate:  [78-107] 96 (04/28 0511) Resp:  [18] 18 (04/28 0511) BP: (89-94)/(69-76) 90/76 mmHg (04/28 0511) SpO2:  [90 %-93 %] 93 % (04/28 0511) Weight:  [110 lb (49.896 kg)] 110 lb (49.896 kg) (04/27 1441) Last BM Date:  06/22/15  Weight change: Filed Weights   06/24/15 1441  Weight: 110 lb (49.896 kg)    Intake/Output:   Intake/Output Summary (Last 24 hours) at 06/25/15 1004 Last data filed at 06/24/15 1900  Gross per 24 hour  Intake    120 ml  Output    300 ml  Net   -180 ml     Physical Exam: General:  Elderly and thin, nearly cachetic.  HEENT: normal Neck: supple. JVP 7-8 vs superficial vascularity. Carotids 2+ bilat; no bruits. No lymphadenopathy or thyromegaly appreciated. Cor: PMI nondisplaced. Irregularly irregular and tachy, No M/G/R appreciated but difficult with fast rate. Lungs: Scattered rhonchi that clear with cough.  Abdomen: soft, nontender, nondistended. No hepatosplenomegaly. No bruits or masses. Good bowel sounds. Extremities: no cyanosis, clubbing, rash, edema. S/p R AKA.  Neuro: alert & orientedx3, cranial nerves grossly intact. moves all 4 extremities w/o difficulty. Affect pleasant  Telemetry: Afib RVR in 120-140s  Labs: Basic Metabolic Panel:  Recent Labs Lab 06/23/15 0615 06/24/15 0215 06/25/15 0330  NA 138 137 138  K 4.3 4.7 4.1  CL 103 106 106  CO2 22 22 24   GLUCOSE 122* 135* 149*  BUN 11 12 13   CREATININE 0.85 0.85 0.79  CALCIUM 8.8* 8.1* 8.1*  MG 1.7  --   --     Liver Function Tests:  Recent Labs Lab 06/23/15 0615  AST 48*  ALT 39  ALKPHOS 76  BILITOT 0.6  PROT 7.4  ALBUMIN 3.1*   No results for input(s): LIPASE, AMYLASE in the last 168 hours. No results for input(s): AMMONIA in the last 168 hours.  CBC:  Recent Labs Lab 06/23/15 0615 06/24/15 0215 06/25/15 0330  WBC 7.3 6.1 4.7  NEUTROABS  --   --  3.7  HGB 13.9 12.4* 11.2*  HCT 43.4 39.3 34.5*  MCV 98.0 99.0 97.2  PLT 139* 123* 130*    Cardiac Enzymes:  Recent Labs Lab 06/23/15 0615  TROPONINI 0.03    BNP: BNP (last 3 results)  Recent Labs  06/23/15 0615  BNP 524.4*    ProBNP (last 3 results) No results for input(s): PROBNP in the last 8760  hours.   CBG: No results for input(s): GLUCAP in the last 168 hours.  Coagulation Studies: No results for input(s): LABPROT, INR in the last 72 hours.  Other results: EKG: 06/24/15 Afib RVR in 180s, ? Old anterior infarct  Imaging:  No results found.   Medications:     Current Medications: . azithromycin  500 mg Oral Daily  . enoxaparin (LOVENOX) injection  40 mg Subcutaneous Q24H  . feeding supplement (ENSURE ENLIVE)  237 mL Oral BID BM  . furosemide  20 mg Intravenous Once  . Melatonin  3 mg Oral QHS  .  metoprolol tartrate  12.5 mg Oral BID  . pantoprazole  40 mg Oral Daily  . polyethylene glycol  17 g Oral Daily  . predniSONE  40 mg Oral Q breakfast  . senna-docusate  2 tablet Oral BID  . sodium chloride flush  3 mL Intravenous Q12H  . traZODone  50 mg Oral BID     Infusions:      Assessment   1. Acute systolic HF - Newly reduced EF - Echo 06/24/15 LVEF 15-20%, Mod MR, Severe LAE, Normal RV, Mild TR, PA peak pressure 2. A fib with RVR, unclear chronicity 3. HTN 4. CAP 5. COPD with ongoing tobacco use 6. H/o TB 7. Chronic Hep C - Has not had treatment.  8. PAD with R AKA  Plan    Unclear etiology for reduced EF. Possibly tachy-medicated from uncontrolled Afib.  No ischemic workup, but no clear ACS symptoms.   Low suspicion for severe CAD but may have some degree. Will assess further with R/LHC this afternoon.   Was in afib RVR at rate of 180s on admit. Currently in 120s-130s. Currently on DVT PPx on with lovenox. This patients CHA2DS2-VASc Score and unadjusted Ischemic Stroke Rate (% per year) is at least 7.2 % stroke rate/year from a score of 5 (HF, HTN, Age, PVD). Will need anticoag for home. Worry about compliance.   Will try on short term amio and start heparin s/p if LHC.   If he doesn't convert chemically, will need TEE/DCCV early next week.   Length of Stay: 2  Graciella Freer PA-C 06/25/2015, 10:04 AM  Advanced Heart Failure  Team Pager [phone] (M-F; 7a - 4p)  Please contact [facility] for night-coverage after hours (4p -7a ) and weekends on [email or web address]   Patient seen and examined with Otilio Saber, PA-C. We discussed all aspects of the encounter. I agree with the assessment and plan as stated above.   He has severe LV dysfunction in setting of multiple cardiac risk factors and AF with RVR. 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 That said he likely has severe CAD. Will plan cath today. Then start amio and heparin. If remains in AF with plan TEE/DC-CV Monday. Given severe COPD would like to limit amio therapy to just a few months if possible.   Bensimhon, Daniel,MD 12:50 PM

## 2015-06-25 NOTE — Care Management Important Message (Signed)
Important Message  Patient Details  Name: [name] MRN: [id number] Date of Birth: Mar 01, 1945   Medicare Important Message Given:  Yes    Darrold Span, RN 06/25/2015, 1:28 PM

## 2015-06-25 NOTE — Discharge Summary (Signed)
Name: [name] MRN: [id number] DOB: 1945-10-01 70 y.o. PCP: No primary care provider on file.  Date of Admission: 06/23/2015  5:45 AM Date of Discharge: 06/30/2015 Attending Physician: No att. providers found  Discharge Diagnosis: 1. Atrial fibrillation with RVR 2. HFrEF 3. CAP   Active Problems:   Atrial fibrillation with RVR (HCC)   Pressure ulcer   Protein-calorie malnutrition, severe   Acute systolic heart failure (HCC)   Lingular pneumonia   Panlobular emphysema (HCC)   Hepatitis C antibody test positive   TB (tuberculosis), treated  Discharge Medications:   Medication List    TAKE these medications        amiodarone 200 MG tablet  Commonly known as:  PACERONE  Take 1 tablet (200 mg total) by mouth 2 (two) times daily.     apixaban 5 MG Tabs tablet  Commonly known as:  ELIQUIS  Take 1 tablet (5 mg total) by mouth every 12 (twelve) hours.     atorvastatin 40 MG tablet  Commonly known as:  LIPITOR  Take 1 tablet (40 mg total) by mouth daily at 6 PM.     carvedilol 6.25 MG tablet  Commonly known as:  COREG  Take 1 tablet (6.25 mg total) by mouth 2 (two) times daily with a meal.     losartan 25 MG tablet  Commonly known as:  COZAAR  Take 1 tablet (25 mg total) by mouth at bedtime.     spironolactone 25 MG tablet  Commonly known as:  ALDACTONE  Take 0.5 tablets (12.5 mg total) by mouth daily.        Disposition and follow-up:   JamesJames Ho was discharged from [facility] in Good condition.  At the hospital follow up visit please address:  1.  Improvement in CV/respiratory symptoms, follow-up with cardiology regarding medical therapy, [facility]and [facility] the [facility] since it's much closer for him)?  2.  Labs / imaging needed at time of follow-up: BMP (check Cr, K since started on diuretics/ARB/amio)  3.  Pending labs/ test needing follow-up: None  Follow-up  Appointments: Follow-up Information    Follow up with Tonye Becket, NP On 07/06/2015.   Specialty:  Cardiology   Why:  Heart Failure Clinic at 1:40 Garage Code [id number]    Contact information:   [address] [place] [address] [phone]       Follow up with [facility]   Why:  rolling walker arranged- to be delivered to room prior to discharge   Contact information:   [address] [place] [address] [phone]       Discharge Instructions: Discharge Instructions    Diet - low sodium heart healthy    Complete by:  As directed      Discharge instructions    Complete by:  As directed   [name], I'm happy you're feeling better. Please continue taking the new medications for your heart as prescribed. Make sure to go to your cardiology appointment next week and [facility] ASAP.  Thanks, [name]     Increase activity slowly    Complete by:  As directed            Consultations: Treatment Team:  Rounding Lbcardiology, MD  Procedures Performed:  Ct Angio Chest Pe W/cm &/or Wo Cm  06/23/2015  CLINICAL DATA:  Shortness of  breath and chest pain, initial encounter EXAM: CT ANGIOGRAPHY CHEST WITH CONTRAST TECHNIQUE: Multidetector CT imaging of the chest was performed using the standard protocol during bolus administration of intravenous contrast. Multiplanar CT image reconstructions and MIPs were obtained to evaluate the vascular anatomy. CONTRAST:  100 mL Isovue 370. COMPARISON:  Plain film from earlier in the same day. FINDINGS: Biapical scarring with calcifications are identified. Diffuse emphysematous changes are noted within both lungs. Infiltrate is noted in the lower lobe anteriorly on the left consistent with that seen on the prior exam but lying within the lower lobe as opposed to the lingula. Small pleural effusion is noted. Some mild posterior lower lobe infiltrate is seen. Scattered changes are noted  within the right lung in a patchy area of somewhat infiltrative changes noted in best seen on image number 49 of series 1006. The thoracic inlet is within normal limits. The thoracic aorta shows calcifications. No aneurysmal dilatation or definitive dissection is seen. Pulmonary artery demonstrates a normal branching pattern. No filling defects are identified. Some calcified lymph nodes are noted as well as some calcified at mediastinal lymph nodes in the subcarinal region. The most prominent right hilar lymph node measures approximately 14 mm in short axis. Coronary calcifications are seen. Scanning into the upper abdomen shows renal cystic change on the left. The osseous structures show diffuse osteopenia. Motion artifact somewhat limits evaluation of the rib cage. No acute appearing compression deformities are noted. Mild compression deformity is seen at T8 and T12. Review of the MIP images confirms the above findings. IMPRESSION: No definitive pulmonary emboli. Patchy infiltrates bilaterally worse in the left lower lobe. Followup PA and lateral chest X-ray is recommended in 3-4 weeks following trial of antibiotic therapy to ensure resolution and exclude underlying malignancy. Changes consistent with prior granulomatous disease. Electronically Signed   By: Alcide Clever M.D.   On: 06/23/2015 09:45   Dg Chest Portable 1 View  06/23/2015  CLINICAL DATA:  Mid chest and epigastric pain today. Initial encounter. EXAM: PORTABLE CHEST 1 VIEW COMPARISON:  None. FINDINGS: The lungs are emphysematous. Focal airspace disease is seen in the lingula. Multiple calcified granulomata are seen in the left upper lobe. A small cluster of calcifications is also seen in the right upper lobe. Heart size is enlarged. No pneumothorax or pleural effusion. IMPRESSION: Lingular airspace disease worrisome for pneumonia. Recommend followup to clearing. Emphysema. Old granulomatous disease. Electronically Signed   By: Drusilla Kanner M.D.    On: 06/23/2015 07:24    2D Echo: Study Conclusions  - Left ventricle: The cavity size was normal. There was mild  concentric hypertrophy. Systolic function was severely reduced.  The estimated ejection fraction was in the range of 15% to 20%.  Diffuse hypokinesis. - Aortic valve: Transvalvular velocity was within the normal range.  There was no stenosis. There was no regurgitation. - Mitral valve: There was moderate regurgitation directed  centrally. - Left atrium: The atrium was severely dilated. - Right ventricle: The cavity size was normal. Wall thickness was  normal. Systolic function was normal. - Atrial septum: No defect or patent foramen ovale was identified  by color flow Doppler. - Tricuspid valve: There was mild regurgitation. - Pulmonary arteries: Systolic pressure was within the normal  range. PA peak pressure: 36 mm Hg (S). - Inferior vena cava: The vessel was dilated. The respirophasic  diameter changes were blunted (< 50%), consistent with elevated  central venous pressure. - Pericardium, extracardiac: There was a left pleural effusion.  Ascites was noted.   Cardiac Cath: Findings:  RA = 4 RV = 24/0/3 PA = 27/10 (19) PCW = 11 Fick cardiac output/index = 5.2/3.1 PVR = 1.5 WU SVR = 1057  FA sat = 93% PA sat = 65%, 65%  Assessment: 1. Minimal CAD 2. 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 Well compensated hemodynamics   Admission HPI: [name] is a 70 y.o. male w/ PMHx of COPD, Chronic Hep C, PVD s/p RLE AKA, and tobacco abuse, presents to the ED w/ complaints of worsening SOB and cough over the past 2-3 months. He says that over the past 1-2 weeks it has become too overwhelming and today he felt the need to come to the ED. He also describes an associated productive cough with yellow sputum, and recent chills. He denies nausea, vomiting, diarrhea, abdominal pain, myalgias, or fatigue. Does have some  mild generalized weakness however. He has had weight loss over the past year, but actually says he feels like he has gained about 10-15 lbs in the last few weeks. He describes a general lack of appetite. [facility] [place], but has not been seen there for several months. He uses an inhaler at times for his known COPD, but has not for a while because he has not been seen at the [place] for months. He also admits to having sleep in a recliner due to SOB when he sleeps, but denies PND.   Of note, patient has previous history of Tb that he states was treated with full course of RIPE therapy, he thinks about 5 years ago at the [place]. Says he spent 11 days at the hospital and then was supervised at home by the [facility] for each medication dose for several months. Is not sure how he  Acquired Tb at that time, thinks he got it from the [facility] hospital.   In the ED, patient was noted to have quite rapid Atrial Fibrillation in the 180's with mild dizziness and palpitations. Says he has experienced these symptoms before.   Patient denies alcohol use, says he does smoke marijuana on occasion, sometimes will take a Xanax to help him sleep, but otherwise denies recreational drug use. Says he used to use cocaine several years ago. He smokes 1-2 cigs per day. No significant family history.  Hospital Course by problem list: Active Problems:   Atrial fibrillation with RVR (HCC)   Pressure ulcer   Protein-calorie malnutrition, severe   Acute systolic heart failure (HCC)   Lingular pneumonia   Panlobular emphysema (HCC)   Hepatitis C antibody test positive   TB (tuberculosis), treated   Afib with RVR and HFrEF: Patient presented with dyspnea and afib with RVR seen on ECG.  Patient was started on Diltiazem gtt for rate control.  He was started on CTX and Azithromycin for lingular CAP with minimal improvement in symptoms.  CTX was stopped and Prednisone started for  consideration of COPD exacerbation.  BNP on presentation was elevated to 530 and TTE demonstrated EF 15-20%.  Heart failure was consulted who performed heart catheterization showing no significant CAD.  Patient was rate and rhythm-controlled on amiodarone.  CHADS-VASc was 3 and patient was anticoagulated with Apixaban.  For his heart failure, etiology remained unclear, though likely due to tachycardia from untreated afib.  Rhythm control was successful with IV Amiodarone and sustained after PO transition. He did not require cardioversion.  His symptoms  improved significantly and he completed a 5-day course of prednisone and azithromycin. He was much improved and discharged on 06/28/2015.   Discharge Vitals:   BP 112/73 mmHg  Pulse 59  Temp(Src) 97.8 F (36.6 C) (Oral)  Resp 18  Ht 6' (1.829 m)  Wt 110 lb (49.896 kg)  BMI 14.92 kg/m2  SpO2 98%  Discharge Labs:  No results found for this or any previous visit (from the past 24 hour(s)).  Signed: Darrick Huntsman, MD 06/30/2015, 1:17 PM    Services Ordered on Discharge: None Equipment Ordered on Discharge: None

## 2015-06-26 DIAGNOSIS — E43 Unspecified severe protein-calorie malnutrition: Secondary | ICD-10-CM

## 2015-06-26 LAB — BASIC METABOLIC PANEL
ANION GAP: 9 (ref 5–15)
BUN: 15 mg/dL (ref 6–20)
CALCIUM: 8.4 mg/dL — AB (ref 8.9–10.3)
CO2: 25 mmol/L (ref 22–32)
Chloride: 106 mmol/L (ref 101–111)
Creatinine, Ser: 0.97 mg/dL (ref 0.61–1.24)
GFR calc Af Amer: >60 mL/min (ref >60)
GLUCOSE: 103 mg/dL — AB (ref 65–99)
Potassium: 4.2 mmol/L (ref 3.5–5.1)
Sodium: 140 mmol/L (ref 135–145)

## 2015-06-26 LAB — MAGNESIUM: MAGNESIUM: 1.7 mg/dL (ref 1.7–2.4)

## 2015-06-26 LAB — HEPATIC FUNCTION PANEL
ALT: 27 U/L (ref 17–63)
AST: 32 U/L (ref 15–41)
Albumin: 2.5 g/dL — ABNORMAL LOW (ref 3.5–5.0)
Alkaline Phosphatase: 61 U/L (ref 38–126)
BILIRUBIN DIRECT: 0.2 mg/dL (ref 0.1–0.5)
BILIRUBIN INDIRECT: 0.1 mg/dL — AB (ref 0.3–0.9)
Total Bilirubin: 0.3 mg/dL (ref 0.3–1.2)
Total Protein: 6.2 g/dL — ABNORMAL LOW (ref 6.5–8.1)

## 2015-06-26 LAB — CBC
HCT: 39.8 % (ref 39.0–52.0)
HEMOGLOBIN: 12.6 g/dL — AB (ref 13.0–17.0)
MCH: 31.4 pg (ref 26.0–34.0)
MCHC: 31.7 g/dL (ref 30.0–36.0)
MCV: 99.3 fL (ref 78.0–100.0)
Platelets: 134 10*3/uL — ABNORMAL LOW (ref 150–400)
RBC: 4.01 MIL/uL — AB (ref 4.22–5.81)
RDW: 14.9 % (ref 11.5–15.5)
WBC: 8.7 10*3/uL (ref 4.0–10.5)

## 2015-06-26 MED ORDER — AZITHROMYCIN 250 MG PO TABS
250.0000 mg | ORAL_TABLET | Freq: Every day | ORAL | Status: AC
  Administered 2015-06-27 – 2015-06-28 (×2): 250 mg via ORAL
  Filled 2015-06-26 (×2): qty 1

## 2015-06-26 MED ORDER — CARVEDILOL 6.25 MG PO TABS
6.2500 mg | ORAL_TABLET | Freq: Two times a day (BID) | ORAL | Status: DC
  Administered 2015-06-26 – 2015-06-28 (×5): 6.25 mg via ORAL
  Filled 2015-06-26 (×5): qty 1

## 2015-06-26 NOTE — Care Management Note (Addendum)
Case Management Note  Patient Details  Name: [name] MRN: [id number] Date of Birth: 11/27/1945  Subjective/Objective:                  CHF  Action/Plan: CM spoke with patient at the bedside. Provided with an Eliquis 30 day free card. States he has Medicare, Medicaid and receives services from the [place]. He states he will have to be seen at the [facility] for them to provide his Eliquis. Patient lives at home with his ex-wife, daughter and granddaughter. Reports no DME needs. He has a cane and a prosthesis for a right AKA.   Expected Discharge Date:    unknown              Expected Discharge Plan:  Home/Self Care  In-House Referral:     Discharge planning Services  CM Consult, Medication Assistance  Post Acute Care Choice:    Choice offered to:     DME Arranged:    DME Agency:     HH Arranged:    HH Agency:     Status of Service:  Completed, signed off  Medicare Important Message Given:  Yes Date Medicare IM Given:    Medicare IM give by:    Date Additional Medicare IM Given:    Additional Medicare Important Message give by:     If discussed at Long Length of Stay Meetings, dates discussed:    Additional Comments:  Antony Haste, RN 06/26/2015, 10:39 AM

## 2015-06-26 NOTE — Progress Notes (Signed)
Subjective: No acute events overnight. He is still in atrial fibrillation. He reports some palpitations, but denies chest pain. He does note SOB when getting up from the bed and walking a short distance.   Objective: Vital signs in last 24 hours: Filed Vitals:   06/25/15 1349 06/25/15 1354 06/25/15 1500 06/25/15 2005  BP: 114/71 117/77 119/88 122/67  Pulse: 93 111 101 73  Temp:   97.6 F (36.4 C) 97.7 F (36.5 C)  TempSrc:   Oral Oral  Resp: 18 58 18 18  Height:      Weight:      SpO2: 0% 0% 96% 95%   Weight change:   Intake/Output Summary (Last 24 hours) at 06/26/15 1039 Last data filed at 06/25/15 1400  Gross per 24 hour  Intake      0 ml  Output    400 ml  Net   -400 ml   Physical Exam  Constitutional: He is oriented to person, place, and time.  Frail, chronically ill appearing male, sitting up in bed, NAD  HENT:  Head: Normocephalic and atraumatic.  Eyes: EOM are normal. No scleral icterus.  Neck: No tracheal deviation present.  Cardiovascular: Normal rate and normal heart sounds.   Irregularly irregular rhythm.    Pulmonary/Chest: Effort normal. No stridor. No respiratory distress. He has no wheezes.  Decreased breath sounds throughout. Minimal crackles in bilateral bases.  No wheezes appreciated.  Abdominal: Soft. He exhibits no distension. There is no tenderness. There is no rebound and no guarding.  Musculoskeletal: He exhibits no edema.  Neurological: He is alert and oriented to person, place, and time.  Skin: Skin is warm and dry.    Lab Results: Basic Metabolic Panel:  Recent Labs Lab 06/23/15 0615  06/25/15 0330 06/26/15 0550  NA 138  < > 138 140  K 4.3  < > 4.1 4.2  CL 103  < > 106 106  CO2 22  < > 24 25  GLUCOSE 122*  < > 149* 103*  BUN 11  < > 13 15  CREATININE 0.85  < > 0.79 0.97  CALCIUM 8.8*  < > 8.1* 8.4*  MG 1.7  --   --  1.7  < > = values in this interval not displayed. Liver Function Tests:  Recent Labs Lab 06/23/15 0615  06/26/15 0550  AST 48* 32  ALT 39 27  ALKPHOS 76 61  BILITOT 0.6 0.3  PROT 7.4 6.2*  ALBUMIN 3.1* 2.5*   CBC:  Recent Labs Lab 06/25/15 0330 06/26/15 0550  WBC 4.7 8.7  NEUTROABS 3.7  --   HGB 11.2* 12.6*  HCT 34.5* 39.8  MCV 97.2 99.3  PLT 130* 134*   Cardiac Enzymes:  Recent Labs Lab 06/23/15 0615  TROPONINI 0.03   Thyroid Function Tests:  Recent Labs Lab 06/25/15 1459  TSH 0.709   Coagulation:  Recent Labs Lab 06/25/15 1225  LABPROT 15.0  INR 1.17   Urine Drug Screen: Drugs of Abuse     Component Value Date/Time   LABOPIA NONE DETECTED 06/23/2015 0810   COCAINSCRNUR NONE DETECTED 06/23/2015 0810   LABBENZ POSITIVE* 06/23/2015 0810   AMPHETMU NONE DETECTED 06/23/2015 0810   THCU POSITIVE* 06/23/2015 0810   LABBARB NONE DETECTED 06/23/2015 0810    Urinalysis:  Recent Labs Lab 06/23/15 1210  COLORURINE YELLOW  LABSPEC >1.046*  PHURINE 6.0  GLUCOSEU NEGATIVE  HGBUR SMALL*  BILIRUBINUR NEGATIVE  KETONESUR NEGATIVE  PROTEINUR NEGATIVE  NITRITE NEGATIVE  LEUKOCYTESUR  NEGATIVE   Medications: I have reviewed the patient's current medications. Scheduled Meds: . apixaban  5 mg Oral Q12H  . azithromycin  500 mg Oral Daily  . carvedilol  3.125 mg Oral BID WC  . feeding supplement (ENSURE ENLIVE)  237 mL Oral BID BM  . Melatonin  3 mg Oral QHS  . pantoprazole  40 mg Oral Daily  . polyethylene glycol  17 g Oral Daily  . predniSONE  40 mg Oral Q breakfast  . senna-docusate  2 tablet Oral BID  . sodium chloride flush  3 mL Intravenous Q12H  . sodium chloride flush  3 mL Intravenous Q12H  . traZODone  50 mg Oral BID   Continuous Infusions: . amiodarone 30 mg/hr (06/26/15 0917)   PRN Meds:.sodium chloride, acetaminophen, acetaminophen, guaiFENesin-codeine, ipratropium-albuterol, ondansetron (ZOFRAN) IV, sodium chloride flush Assessment/Plan:  Mr. Ferrara is a 70 y.o. male w/ PMHx of COPD, Chronic Hep C, PVD s/p RLE AKA, and tobacco abuse,  admitted for shortness of breath.   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 He was found to have an EF 15-20% on TTE in the setting of orthopnea, elevated BNP, and effusion on CT. He underwent R/LHC yesterday (4/28) which revealed minimal CAD, making his severe LV dysfunction non-ischemic in nature. 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 HF team has started him on amio and hoping he will convert to sinus rhythm. If not, plan is to do TEE/DCCV on Monday.    - Continue Azithro- will change to 250 mg PO daily for 2 more days (end date 06/28/15) - Continue Prednisone 40 mg x5 days (end date 06/28/15) - Continue Coreg 3.125 mg BID - Continue Eliquis and Amio for AFib as below - Duonebs q4h PRN - Guaifenesin-Codeine PRN for cough - Heart failure following, appreciate recs  Afib with RVR, improving: Patient now rate-controlled. CHADS-VASc is 3 (CHF, Age < 75, and PAD), so patient qualifies for anticoagulation.  He has previously ben on warfarin and would prefer DOAC.  Heart failure has started Amio. - Continue Amio - Continue Eliquis - Plan is for TEE/DCCV on Monday if not converted  COPD: Patient with extensive bullous disease on CT, states he has been told he has emphysema in the past. Still smokes 1-2 cigs per day. No PFT's given that he receives his care at [facility].  - Duonebs as above - Prednisone - Azithro  Previous h/o Tb: Says this was diagnosed ~5 years ago at the [place], [place]. Fully treated. Spent 11 days in the hospital and had witnessed medication administration with the [facility]. Obvious granulomatous disease on CTA.  -Obtain records from [facility]  Chronic Hepatitis C: Patient knows he has this. Mild LFT abnormalities. Has discussed treatment at the [place] in the past.  -Further management as an outpatient  Protein Calorie Malnutrition: Patient quite  thin, ill-appearing, low albumin.  -Nutrition consult  Hematuria: Patient reported. Small Hgb and 0-5 RBC on U/A. Also with BPH symptoms.  Patient will need Urology f/u after discharge.  -Consider starting Flomax for BPH symptoms  DVT/PE PPx: Lovenox [place] Dispo: Disposition is deferred at this time, awaiting improvement of current medical problems.  Anticipated discharge in approximately 2-3 day(s).   The patient does have a current PCP (No primary care provider on file.) and does need an [facility] hospital follow-up appointment after discharge.  The patient does not  have transportation limitations that hinder transportation to clinic appointments.  .Services Needed at time of discharge: Y = Yes, Blank = No PT:   OT:   RN:   Equipment:   Other:     LOS: 3 days   Su Hoff, MD 06/26/2015, 10:39 AM

## 2015-06-26 NOTE — Discharge Instructions (Signed)

## 2015-06-26 NOTE — Evaluation (Signed)
Physical Therapy Evaluation Patient Details Name: [name] MRN: [id number] DOB: Aug 18, 1945 Today's Date: 06/26/2015   History of Present Illness  Pt is a 70 y/o M admitted w/ worsening SOB and cough.  Dx: acute systolic HF, newly reduced EF 02-54%.  Pt's PMH includes hepatitis C, Rt AKA, TB, COPD, HTN.    Clinical Impression  Pt admitted with above diagnosis. Pt currently with functional limitations due to the deficits listed below (see PT Problem List). Mr. Costilla was anxious about mobility but agreeable to work w/ therapy. Attempted multiple times to don Rt LE prosthesis unsuccessfully and pt c/o chest pressure.  PTA pt at mod I level of mobility, using cane for ambulation.  Anticipate that once chest pressure and a-fib resolved, pt will quickly return to mod I. He has 24/7 assist available at d/c from his ex-wife. Pt will benefit from skilled PT to increase their independence and safety with mobility to allow discharge to the venue listed below.      Follow Up Recommendations No PT follow up;Supervision for mobility/OOB    Equipment Recommendations  None recommended by PT    Recommendations for Other Services       Precautions / Restrictions Precautions Precautions: Fall Required Braces or Orthoses: Other Brace/Splint Other Brace/Splint: Lt LE prosthesis Restrictions Weight Bearing Restrictions: No      Mobility  Bed Mobility Overal bed mobility: Independent             General bed mobility comments: No cues or physical assist needed  Transfers Overall transfer level: Needs assistance Equipment used:  (pt holding onto IV pole once standing) Transfers: Sit to/from [employer] Sit to Stand: Min assist Stand pivot transfers: Min guard       General transfer comment: Min assist x1 due to LOB while attempting to don prosthesis.  Pt holding onto IV pole initially when standing.  Min guard assist for safety w/ pivot to chair w/ pt holding onto  armrests of chair during pivot.  Ambulation/Gait             General Gait Details: Attempted x3 to don Rt LE prosthesis unsuccessfully.  Pt w/ increased exertion w/ this and c/o chest pressure.  HR up to 130s, RN notified.  Stairs            Wheelchair Mobility    Modified Rankin (Stroke Patients Only)       Balance Overall balance assessment: Needs assistance Sitting-balance support: Feet supported;No upper extremity supported Sitting balance-Leahy Scale: Good     Standing balance support: Single extremity supported;During functional activity Standing balance-Leahy Scale: Poor Standing balance comment: Require 1 UE support to maintain balance w/ static standing                             Pertinent Vitals/Pain Pain Assessment: Faces Faces Pain Scale: Hurts even more Pain Location: chest Pain Descriptors / Indicators: Pressure Pain Intervention(s): Limited activity within patient's tolerance;Monitored during session    Home Living Family/patient expects to be discharged to:: Private residence Living Arrangements: Children;Other (Comment) (Ex-wife, daughter, grandchildren) Available Help at Discharge: Family;Available 24 hours/day (ex-wife available 24/7) Type of Home: Mobile home Home Access: Stairs to enter Entrance Stairs-Rails: Can reach both;Left;Right Entrance Stairs-Number of Steps: 3 Home Layout: One level Home Equipment: Cane - single point      Prior Function Level of Independence: Independent with assistive device(s)  Comments: Ambulates using cane.  Washes up in the tub and then turns on the shower to wash off.  Ex-wife assists w/ cooking, cleaning.  Says he hasn't used his WC in 18 years and recently threw it away as it was in bad shape. When he goes to the grocery he uses the motorized scooter to get around.     Hand Dominance        Extremity/Trunk Assessment   Upper Extremity Assessment: Overall WFL for tasks  assessed           Lower Extremity Assessment: RLE deficits/detail RLE Deficits / Details: h/o Rt AKA       Communication   Communication: No difficulties  Cognition Arousal/Alertness: Awake/alert Behavior During Therapy: Anxious Overall Cognitive Status: Within Functional Limits for tasks assessed                      General Comments General comments (skin integrity, edema, etc.): Pulse ox not reading well during most of session.  Noted that on exertion SpO2 seen as low as 85% on RA and in the 90s at rest sitting EOB.    Exercises        Assessment/Plan    PT Assessment Patient needs continued PT services  PT Diagnosis Abnormality of gait   PT Problem List Decreased activity tolerance;Decreased balance;Decreased mobility;Decreased knowledge of use of DME;Decreased safety awareness;Cardiopulmonary status limiting activity;Pain  PT Treatment Interventions DME instruction;Gait training;Functional mobility training;Stair training;Therapeutic activities;Therapeutic exercise;Balance training;Patient/family education   PT Goals (Current goals can be found in the Care Plan section) Acute Rehab PT Goals Patient Stated Goal: to get back home  PT Goal Formulation: With patient Time For Goal Achievement: 07/10/15 Potential to Achieve Goals: Good    Frequency Min 3X/week   Barriers to discharge Inaccessible home environment steps to enter home    Co-evaluation               End of Session Equipment Utilized During Treatment: Gait belt Activity Tolerance: Patient limited by pain (limited by pressure in chest) Patient left: in chair;with call bell/phone within reach;with chair alarm set Nurse Communication: Mobility status;Other (comment) (chest pressure; HR; SpO2)         Time: 1610-9604 PT Time Calculation (min) (ACUTE ONLY): 30 min   Charges:   PT Evaluation $PT Eval Low Complexity: 1 Procedure PT Treatments $Therapeutic Activity: 8-22 mins   PT  G Codes:       Encarnacion Chu PT, DPT  Pager: [phone] Phone: [phone] 06/26/2015, 11:04 AM

## 2015-06-26 NOTE — Progress Notes (Signed)
DAILY PROGRESS NOTE  Subjective:  Noted R/LHC findings of Dr. Haroldine Laws yesterday - mild to moderate, non-obstructive CAD with severe LV dysfunction that is out of proportion to CAD - suspect rate-related with a-fib. PCWP 11 - RA 4, PA Sat% 65, appears well-compensated. Started on apixiban and IV amiodarone. Rate remains in 120's.  Objective:  Temp:  [97.6 F (36.4 C)-97.7 F (36.5 C)] 97.7 F (36.5 C) (04/28 2005) Pulse Rate:  [0-164] 73 (04/28 2005) Resp:  [0-59] 18 (04/28 2005) BP: (92-124)/(59-88) 122/67 mmHg (04/28 2005) SpO2:  [0 %-96 %] 95 % (04/28 2005) Weight change:   Intake/Output from previous day: 04/28 0701 - 04/29 0700 In: 240 [P.O.:240] Out: 1100 [Urine:1100]  Intake/Output from this shift:    Medications: Current Facility-Administered Medications  Medication Dose Route Frequency Provider Last Rate Last Dose  . 0.9 %  sodium chloride infusion  250 mL Intravenous PRN Jolaine Artist, MD      . acetaminophen (TYLENOL) tablet 650 mg  650 mg Oral Q6H PRN Iline Oven, MD   650 mg at 06/24/15 1329  . acetaminophen (TYLENOL) tablet 650 mg  650 mg Oral Q4H PRN Jolaine Artist, MD      . amiodarone (NEXTERONE PREMIX) 360-4.14 MG/200ML-% (1.8 mg/mL) IV infusion  30 mg/hr Intravenous Continuous Jolaine Artist, MD 16.7 mL/hr at 06/26/15 0917 30 mg/hr at 06/26/15 0917  . apixaban (ELIQUIS) tablet 5 mg  5 mg Oral Q12H Jolaine Artist, MD   5 mg at 06/26/15 3825  . azithromycin (ZITHROMAX) tablet 500 mg  500 mg Oral Daily Kris Mouton, RPH   500 mg at 06/25/15 1100  . carvedilol (COREG) tablet 3.125 mg  3.125 mg Oral BID WC Jolaine Artist, MD   3.125 mg at 06/26/15 0539  . feeding supplement (ENSURE ENLIVE) (ENSURE ENLIVE) liquid 237 mL  237 mL Oral BID BM Bartholomew Crews, MD      . guaiFENesin-codeine 100-10 MG/5ML solution 10 mL  10 mL Oral Q6H PRN Corky Sox, MD   10 mL at 06/23/15 2247  . ipratropium-albuterol (DUONEB) 0.5-2.5 (3) MG/3ML  nebulizer solution 3 mL  3 mL Nebulization Q4H PRN Corky Sox, MD   3 mL at 06/25/15 0815  . Melatonin TABS 3 mg  3 mg Oral QHS Bartholomew Crews, MD   3 mg at 06/25/15 2314  . ondansetron (ZOFRAN) injection 4 mg  4 mg Intravenous Q6H PRN Jolaine Artist, MD      . pantoprazole (PROTONIX) EC tablet 40 mg  40 mg Oral Daily Iline Oven, MD   40 mg at 06/25/15 1045  . polyethylene glycol (MIRALAX / GLYCOLAX) packet 17 g  17 g Oral Daily Iline Oven, MD   17 g at 06/25/15 1000  . predniSONE (DELTASONE) tablet 40 mg  40 mg Oral Q breakfast Iline Oven, MD   40 mg at 06/26/15 0651  . senna-docusate (Senokot-S) tablet 2 tablet  2 tablet Oral BID Iline Oven, MD   2 tablet at 06/25/15 2313  . sodium chloride flush (NS) 0.9 % injection 3 mL  3 mL Intravenous Q12H Corky Sox, MD   3 mL at 06/25/15 1000  . sodium chloride flush (NS) 0.9 % injection 3 mL  3 mL Intravenous Q12H Jolaine Artist, MD   3 mL at 06/25/15 1800  . sodium chloride flush (NS) 0.9 % injection 3 mL  3 mL Intravenous PRN Quillian Quince  R Bensimhon, MD      . traZODone (DESYREL) tablet 50 mg  50 mg Oral BID Iline Oven, MD   50 mg at 06/25/15 2313    Physical Exam: General appearance: alert, cachectic and no distress Neck: no carotid bruit and no JVD Lungs: diminished breath sounds bilaterally and rhonchi bilaterally Heart: irregularly irregular rhythm Abdomen: soft, non-tender; bowel sounds normal; no masses,  no organomegaly and scaphoid Extremities: extremities normal, atraumatic, no cyanosis or edema Pulses: 2+ and symmetric Skin: Skin color, texture, turgor normal. No rashes or lesions Neurologic: Grossly normal  Lab Results: Results for orders placed or performed during the hospital encounter of 06/23/15 (from the past 48 hour(s))  CBC with Differential/Platelet     Status: Abnormal   Collection Time: 06/25/15  3:30 AM  Result Value Ref Range   WBC 4.7 4.0 - 10.5 K/uL   RBC 3.55 (L) 4.22 -  5.81 MIL/uL   Hemoglobin 11.2 (L) 13.0 - 17.0 g/dL   HCT 34.5 (L) 39.0 - 52.0 %   MCV 97.2 78.0 - 100.0 fL   MCH 31.5 26.0 - 34.0 pg   MCHC 32.5 30.0 - 36.0 g/dL   RDW 14.5 11.5 - 15.5 %   Platelets 130 (L) 150 - 400 K/uL   Neutrophils Relative % 78 %   Neutro Abs 3.7 1.7 - 7.7 K/uL   Lymphocytes Relative 13 %   Lymphs Abs 0.6 (L) 0.7 - 4.0 K/uL   Monocytes Relative 9 %   Monocytes Absolute 0.4 0.1 - 1.0 K/uL   Eosinophils Relative 0 %   Eosinophils Absolute 0.0 0.0 - 0.7 K/uL   Basophils Relative 0 %   Basophils Absolute 0.0 0.0 - 0.1 K/uL  Basic metabolic panel     Status: Abnormal   Collection Time: 06/25/15  3:30 AM  Result Value Ref Range   Sodium 138 135 - 145 mmol/L   Potassium 4.1 3.5 - 5.1 mmol/L   Chloride 106 101 - 111 mmol/L   CO2 24 22 - 32 mmol/L   Glucose, Bld 149 (H) 65 - 99 mg/dL   BUN 13 6 - 20 mg/dL   Creatinine, Ser 0.79 0.61 - 1.24 mg/dL   Calcium 8.1 (L) 8.9 - 10.3 mg/dL   GFR calc non Af Amer >60 >60 mL/min   GFR calc Af Amer >60 >60 mL/min    Comment: (NOTE) The eGFR has been calculated using the CKD EPI equation. This calculation has not been validated in all clinical situations. eGFR's persistently <60 mL/min signify possible Chronic Kidney Disease.    Anion gap 8 5 - 15  Protime-INR     Status: None   Collection Time: 06/25/15 12:25 PM  Result Value Ref Range   Prothrombin Time 15.0 11.6 - 15.2 seconds   INR 1.17 0.00 - 1.49  I-STAT 3, arterial blood gas (G3+)     Status: Abnormal   Collection Time: 06/25/15  1:09 PM  Result Value Ref Range   pH, Arterial 7.440 7.350 - 7.450   pCO2 arterial 38.5 35.0 - 45.0 mmHg   pO2, Arterial 66.0 (L) 80.0 - 100.0 mmHg   Bicarbonate 26.2 (H) 20.0 - 24.0 mEq/L   TCO2 27 0 - 100 mmol/L   O2 Saturation 93.0 %   Acid-Base Excess 2.0 0.0 - 2.0 mmol/L   Patient temperature HIDE    Sample type ARTERIAL   I-STAT 3, venous blood gas (G3P V)     Status: Abnormal   Collection Time:  06/25/15  1:12 PM  Result  Value Ref Range   pH, Ven 7.401 (H) 7.250 - 7.300   pCO2, Ven 41.5 (L) 45.0 - 50.0 mmHg   pO2, Ven 34.0 31.0 - 45.0 mmHg   Bicarbonate 25.8 (H) 20.0 - 24.0 mEq/L   TCO2 27 0 - 100 mmol/L   O2 Saturation 65.0 %   Acid-Base Excess 1.0 0.0 - 2.0 mmol/L   Patient temperature HIDE    Sample type VENOUS    Comment NOTIFIED PHYSICIAN   I-STAT 3, venous blood gas (G3P V)     Status: Abnormal   Collection Time: 06/25/15  1:13 PM  Result Value Ref Range   pH, Ven 7.402 (H) 7.250 - 7.300   pCO2, Ven 44.8 (L) 45.0 - 50.0 mmHg   pO2, Ven 34.0 31.0 - 45.0 mmHg   Bicarbonate 27.9 (H) 20.0 - 24.0 mEq/L   TCO2 29 0 - 100 mmol/L   O2 Saturation 65.0 %   Acid-Base Excess 3.0 (H) 0.0 - 2.0 mmol/L   Patient temperature HIDE    Sample type VENOUS   TSH     Status: None   Collection Time: 06/25/15  2:59 PM  Result Value Ref Range   TSH 0.709 0.350 - 4.500 uIU/mL  Magnesium     Status: None   Collection Time: 06/26/15  5:50 AM  Result Value Ref Range   Magnesium 1.7 1.7 - 2.4 mg/dL  Basic metabolic panel     Status: Abnormal   Collection Time: 06/26/15  5:50 AM  Result Value Ref Range   Sodium 140 135 - 145 mmol/L   Potassium 4.2 3.5 - 5.1 mmol/L   Chloride 106 101 - 111 mmol/L   CO2 25 22 - 32 mmol/L   Glucose, Bld 103 (H) 65 - 99 mg/dL   BUN 15 6 - 20 mg/dL   Creatinine, Ser 0.97 0.61 - 1.24 mg/dL   Calcium 8.4 (L) 8.9 - 10.3 mg/dL   GFR calc non Af Amer >60 >60 mL/min   GFR calc Af Amer >60 >60 mL/min    Comment: (NOTE) The eGFR has been calculated using the CKD EPI equation. This calculation has not been validated in all clinical situations. eGFR's persistently <60 mL/min signify possible Chronic Kidney Disease.    Anion gap 9 5 - 15  Hepatic function panel     Status: Abnormal   Collection Time: 06/26/15  5:50 AM  Result Value Ref Range   Total Protein 6.2 (L) 6.5 - 8.1 g/dL   Albumin 2.5 (L) 3.5 - 5.0 g/dL   AST 32 15 - 41 U/L   ALT 27 17 - 63 U/L   Alkaline Phosphatase 61 38  - 126 U/L   Total Bilirubin 0.3 0.3 - 1.2 mg/dL   Bilirubin, Direct 0.2 0.1 - 0.5 mg/dL   Indirect Bilirubin 0.1 (L) 0.3 - 0.9 mg/dL  CBC     Status: Abnormal   Collection Time: 06/26/15  5:50 AM  Result Value Ref Range   WBC 8.7 4.0 - 10.5 K/uL   RBC 4.01 (L) 4.22 - 5.81 MIL/uL   Hemoglobin 12.6 (L) 13.0 - 17.0 g/dL   HCT 39.8 39.0 - 52.0 %   MCV 99.3 78.0 - 100.0 fL   MCH 31.4 26.0 - 34.0 pg   MCHC 31.7 30.0 - 36.0 g/dL   RDW 14.9 11.5 - 15.5 %   Platelets 134 (L) 150 - 400 K/uL    Imaging: No results found.  Assessment:  1. Active Problems: 2.   Atrial fibrillation with RVR ([place]) 3.   Pressure ulcer 4.   Protein-calorie malnutrition, severe 5.   Acute systolic heart failure ([place]) 6.   Plan:  1. Compensated, non-ischemic cardiomyopathy. Continue IV amiodarone. Will likely need TEE/DCCV on Monday after 5 doses of Eliquis. Cardiomyopathy is compensated, increase coreg to 6.25 mg BID this afternoon. No acute need for diuresis based on filling pressures.  Time Spent Directly with Patient:  15 minutes  Length of Stay:  LOS: 3 days   Pixie Casino, MD, [facility] Attending Cardiologist [place] 06/26/2015, 10:59 AM

## 2015-06-27 LAB — BASIC METABOLIC PANEL
ANION GAP: 10 (ref 5–15)
BUN: 22 mg/dL — ABNORMAL HIGH (ref 6–20)
CALCIUM: 8.2 mg/dL — AB (ref 8.9–10.3)
CO2: 28 mmol/L (ref 22–32)
CREATININE: 0.92 mg/dL (ref 0.61–1.24)
Chloride: 97 mmol/L — ABNORMAL LOW (ref 101–111)
Glucose, Bld: 181 mg/dL — ABNORMAL HIGH (ref 65–99)
Potassium: 3.8 mmol/L (ref 3.5–5.1)
SODIUM: 135 mmol/L (ref 135–145)

## 2015-06-27 LAB — BRAIN NATRIURETIC PEPTIDE: B NATRIURETIC PEPTIDE 5: 739.9 pg/mL — AB (ref 0.0–100.0)

## 2015-06-27 LAB — TROPONIN I: TROPONIN I: 0.03 ng/mL (ref <0.031)

## 2015-06-27 MED ORDER — DICLOFENAC SODIUM 1 % TD GEL
2.0000 g | Freq: Four times a day (QID) | TRANSDERMAL | Status: DC
  Administered 2015-06-27 (×3): 2 g via TOPICAL
  Filled 2015-06-27: qty 100

## 2015-06-27 MED ORDER — LOSARTAN POTASSIUM 25 MG PO TABS
25.0000 mg | ORAL_TABLET | Freq: Every day | ORAL | Status: DC
  Administered 2015-06-27: 25 mg via ORAL
  Filled 2015-06-27: qty 1

## 2015-06-27 MED ORDER — SPIRONOLACTONE 25 MG PO TABS
12.5000 mg | ORAL_TABLET | Freq: Every day | ORAL | Status: DC
  Administered 2015-06-28: 12.5 mg via ORAL
  Filled 2015-06-27: qty 1

## 2015-06-27 MED ORDER — AMIODARONE HCL 200 MG PO TABS
200.0000 mg | ORAL_TABLET | Freq: Two times a day (BID) | ORAL | Status: DC
  Administered 2015-06-27 – 2015-06-28 (×2): 200 mg via ORAL
  Filled 2015-06-27 (×2): qty 1

## 2015-06-27 NOTE — Progress Notes (Signed)
DAILY PROGRESS NOTE  Subjective:   Feels good. Says he feels a little tight in chest at times but better now that he is in NSR. Has converted to NSR. HR in 60s. No weight in chart.   Objective:  Temp:  [97.5 F (36.4 C)-97.8 F (36.6 C)] 97.5 F (36.4 C) (04/30 0401) Pulse Rate:  [88-105] 88 (04/30 0401) Resp:  [17-21] 21 (04/30 0401) BP: (104-122)/(76-90) 106/83 mmHg (04/30 0401) SpO2:  [98 %-100 %] 100 % (04/30 0401) Weight change:   Intake/Output from previous day: 04/29 0701 - 04/30 0700 In: 480 [P.O.:480] Out: 850 [Urine:850]  Intake/Output from this shift:    Medications: Current Facility-Administered Medications  Medication Dose Route Frequency Provider Last Rate Last Dose  . 0.9 %  sodium chloride infusion  250 mL Intravenous PRN Jolaine Artist, MD      . acetaminophen (TYLENOL) tablet 650 mg  650 mg Oral Q6H PRN Iline Oven, MD   650 mg at 06/24/15 1329  . acetaminophen (TYLENOL) tablet 650 mg  650 mg Oral Q4H PRN Jolaine Artist, MD   650 mg at 06/27/15 0946  . amiodarone (NEXTERONE PREMIX) 360-4.14 MG/200ML-% (1.8 mg/mL) IV infusion  30 mg/hr Intravenous Continuous Jolaine Artist, MD 16.7 mL/hr at 06/27/15 0939 30 mg/hr at 06/27/15 0939  . apixaban (ELIQUIS) tablet 5 mg  5 mg Oral Q12H Jolaine Artist, MD   5 mg at 06/27/15 0601  . azithromycin (ZITHROMAX) tablet 250 mg  250 mg Oral Daily Juliet Rude, MD   250 mg at 06/27/15 0947  . carvedilol (COREG) tablet 6.25 mg  6.25 mg Oral BID WC Pixie Casino, MD   6.25 mg at 06/27/15 0800  . diclofenac sodium (VOLTAREN) 1 % transdermal gel 2 g  2 g Topical QID Iline Oven, MD   2 g at 06/27/15 0819  . feeding supplement (ENSURE ENLIVE) (ENSURE ENLIVE) liquid 237 mL  237 mL Oral BID BM Bartholomew Crews, MD      . guaiFENesin-codeine 100-10 MG/5ML solution 10 mL  10 mL Oral Q6H PRN Corky Sox, MD   10 mL at 06/23/15 2247  . ipratropium-albuterol (DUONEB) 0.5-2.5 (3) MG/3ML nebulizer  solution 3 mL  3 mL Nebulization Q4H PRN Corky Sox, MD   3 mL at 06/25/15 0815  . Melatonin TABS 3 mg  3 mg Oral QHS Bartholomew Crews, MD   3 mg at 06/26/15 2212  . ondansetron (ZOFRAN) injection 4 mg  4 mg Intravenous Q6H PRN Jolaine Artist, MD      . pantoprazole (PROTONIX) EC tablet 40 mg  40 mg Oral Daily Iline Oven, MD   40 mg at 06/27/15 0946  . polyethylene glycol (MIRALAX / GLYCOLAX) packet 17 g  17 g Oral Daily Iline Oven, MD   17 g at 06/26/15 1104  . predniSONE (DELTASONE) tablet 40 mg  40 mg Oral Q breakfast Iline Oven, MD   40 mg at 06/27/15 0818  . senna-docusate (Senokot-S) tablet 2 tablet  2 tablet Oral BID Iline Oven, MD   2 tablet at 06/26/15 1104  . sodium chloride flush (NS) 0.9 % injection 3 mL  3 mL Intravenous Q12H Corky Sox, MD   3 mL at 06/25/15 1000  . sodium chloride flush (NS) 0.9 % injection 3 mL  3 mL Intravenous Q12H Jolaine Artist, MD   3 mL at 06/25/15 1800  . sodium  chloride flush (NS) 0.9 % injection 3 mL  3 mL Intravenous PRN Jolaine Artist, MD      . traZODone (DESYREL) tablet 50 mg  50 mg Oral BID Iline Oven, MD   50 mg at 06/27/15 6578    Physical Exam: General: Elderly and thin HEENT: normal Neck: supple. JVP 7-8 vs superficial vascularity. Carotids 2+ bilat; no bruits. No lymphadenopathy or thyromegaly appreciated. Cor: PMI nondisplaced.RRR No M/G/R appreciated but difficult with fast rate. Lungs: decreasedBS throughout  No wheezing Abdomen: soft, nontender, nondistended. No hepatosplenomegaly. No bruits or masses. Good bowel sounds. Extremities: no cyanosis, clubbing, rash, edema. S/p R AKA.  Neuro: alert & orientedx3, cranial nerves grossly intact. moves all 4 extremities w/o difficulty. Affect pleasant  Lab Results: Results for orders placed or performed during the hospital encounter of 06/23/15 (from the past 48 hour(s))  TSH     Status: None   Collection Time: 06/25/15  2:59 PM  Result  Value Ref Range   TSH 0.709 0.350 - 4.500 uIU/mL  Magnesium     Status: None   Collection Time: 06/26/15  5:50 AM  Result Value Ref Range   Magnesium 1.7 1.7 - 2.4 mg/dL  Basic metabolic panel     Status: Abnormal   Collection Time: 06/26/15  5:50 AM  Result Value Ref Range   Sodium 140 135 - 145 mmol/L   Potassium 4.2 3.5 - 5.1 mmol/L   Chloride 106 101 - 111 mmol/L   CO2 25 22 - 32 mmol/L   Glucose, Bld 103 (H) 65 - 99 mg/dL   BUN 15 6 - 20 mg/dL   Creatinine, Ser 0.97 0.61 - 1.24 mg/dL   Calcium 8.4 (L) 8.9 - 10.3 mg/dL   GFR calc non Af Amer >60 >60 mL/min   GFR calc Af Amer >60 >60 mL/min    Comment: (NOTE) The eGFR has been calculated using the CKD EPI equation. This calculation has not been validated in all clinical situations. eGFR's persistently <60 mL/min signify possible Chronic Kidney Disease.    Anion gap 9 5 - 15  Hepatic function panel     Status: Abnormal   Collection Time: 06/26/15  5:50 AM  Result Value Ref Range   Total Protein 6.2 (L) 6.5 - 8.1 g/dL   Albumin 2.5 (L) 3.5 - 5.0 g/dL   AST 32 15 - 41 U/L   ALT 27 17 - 63 U/L   Alkaline Phosphatase 61 38 - 126 U/L   Total Bilirubin 0.3 0.3 - 1.2 mg/dL   Bilirubin, Direct 0.2 0.1 - 0.5 mg/dL   Indirect Bilirubin 0.1 (L) 0.3 - 0.9 mg/dL  CBC     Status: Abnormal   Collection Time: 06/26/15  5:50 AM  Result Value Ref Range   WBC 8.7 4.0 - 10.5 K/uL   RBC 4.01 (L) 4.22 - 5.81 MIL/uL   Hemoglobin 12.6 (L) 13.0 - 17.0 g/dL   HCT 39.8 39.0 - 52.0 %   MCV 99.3 78.0 - 100.0 fL   MCH 31.4 26.0 - 34.0 pg   MCHC 31.7 30.0 - 36.0 g/dL   RDW 14.9 11.5 - 15.5 %   Platelets 134 (L) 150 - 400 K/uL  Basic metabolic panel     Status: Abnormal   Collection Time: 06/27/15  4:31 AM  Result Value Ref Range   Sodium 135 135 - 145 mmol/L   Potassium 3.8 3.5 - 5.1 mmol/L   Chloride 97 (L) 101 - 111 mmol/L  CO2 28 22 - 32 mmol/L   Glucose, Bld 181 (H) 65 - 99 mg/dL   BUN 22 (H) 6 - 20 mg/dL   Creatinine, Ser 0.92 0.61  - 1.24 mg/dL   Calcium 8.2 (L) 8.9 - 10.3 mg/dL   GFR calc non Af Amer >60 >60 mL/min   GFR calc Af Amer >60 >60 mL/min    Comment: (NOTE) The eGFR has been calculated using the CKD EPI equation. This calculation has not been validated in all clinical situations. eGFR's persistently <60 mL/min signify possible Chronic Kidney Disease.    Anion gap 10 5 - 15  Brain natriuretic peptide     Status: Abnormal   Collection Time: 06/27/15  8:15 AM  Result Value Ref Range   B Natriuretic Peptide 739.9 (H) 0.0 - 100.0 pg/mL  Troponin I     Status: None   Collection Time: 06/27/15  8:15 AM  Result Value Ref Range   Troponin I 0.03 <0.031 ng/mL    Comment:        NO INDICATION OF MYOCARDIAL INJURY.     Imaging: No results found.  Assessment:  1. Acute systolic HF - Newly reduced EF - Echo 06/24/15 LVEF 15-20%, Mod MR, Severe LAE, Normal RV, Mild TR, PA peak pressure  --cath this admit with no CAD 2. A fib with RVR - now back in NSR on amio   --on Eliquis 3. COPD with ongoing tobacco use 4. H/o TB 5. Chronic Hep C - Has not had treatment.  6. PAD with R AKA  Plan:  Suspect he has tachy-mediated CM. Now back in NSR on amio. Volume status looks good. On carvedilol. Will start low-dose spiro and losartan. Likely can go home tomorrow with close outpatient f/u. No need for lasix at this point. Will stop prednisone. Continue Eliquis.   Length of Stay:  LOS: 4 days  Glori Bickers MD 06/27/2015, 2:24 PM

## 2015-06-27 NOTE — Progress Notes (Signed)
Subjective: No acute events overnight. He is still in atrial fibrillation. He reports chest pressure making it difficult to breathe at times.  This pressure is sometimes relieved with breathing treatments, but sometimes not.  He has been having similar pains since admission, but not this bad.  Objective: Vital signs in last 24 hours: Filed Vitals:   06/25/15 2005 06/26/15 1605 06/26/15 2026 06/27/15 0401  BP: 122/67 122/90 104/76 106/83  Pulse: 73 105 90 88  Temp: 97.7 F (36.5 C) 97.7 F (36.5 C) 97.8 F (36.6 C) 97.5 F (36.4 C)  TempSrc: Oral Oral Oral Oral  Resp: 18 17 18 21   Height:      Weight:      SpO2: 95% 99% 98% 100%   Weight change:   Intake/Output Summary (Last 24 hours) at 06/27/15 0809 Last data filed at 06/26/15 1606  Gross per 24 hour  Intake    480 ml  Output    850 ml  Net   -370 ml   Physical Exam  Constitutional: He is oriented to person, place, and time.  Frail, chronically ill appearing male, sitting up in bed, NAD  HENT:  Head: Normocephalic and atraumatic.  Eyes: EOM are normal. No scleral icterus.  Neck: No tracheal deviation present.  Cardiovascular: Normal rate and normal heart sounds.   Irregularly irregular rhythm.    Pulmonary/Chest: Effort normal. No stridor. No respiratory distress. He has no wheezes.  Decreased breath sounds throughout. Minimal crackles in bilateral bases.  No wheezes appreciated.  Abdominal: Soft. He exhibits no distension. There is no tenderness. There is no rebound and no guarding.  Musculoskeletal: He exhibits no edema.  Neurological: He is alert and oriented to person, place, and time.  Skin: Skin is warm and dry.    Lab Results: Basic Metabolic Panel:  Recent Labs Lab 06/23/15 0615  06/26/15 0550 06/27/15 0431  NA 138  < > 140 135  K 4.3  < > 4.2 3.8  CL 103  < > 106 97*  CO2 22  < > 25 28  GLUCOSE 122*  < > 103* 181*  BUN 11  < > 15 22*  CREATININE 0.85  < > 0.97 0.92  CALCIUM 8.8*  < > 8.4*  8.2*  MG 1.7  --  1.7  --   < > = values in this interval not displayed. Liver Function Tests:  Recent Labs Lab 06/23/15 0615 06/26/15 0550  AST 48* 32  ALT 39 27  ALKPHOS 76 61  BILITOT 0.6 0.3  PROT 7.4 6.2*  ALBUMIN 3.1* 2.5*   CBC:  Recent Labs Lab 06/25/15 0330 06/26/15 0550  WBC 4.7 8.7  NEUTROABS 3.7  --   HGB 11.2* 12.6*  HCT 34.5* 39.8  MCV 97.2 99.3  PLT 130* 134*   Cardiac Enzymes:  Recent Labs Lab 06/23/15 0615  TROPONINI 0.03   Thyroid Function Tests:  Recent Labs Lab 06/25/15 1459  TSH 0.709   Coagulation:  Recent Labs Lab 06/25/15 1225  LABPROT 15.0  INR 1.17   Urine Drug Screen: Drugs of Abuse     Component Value Date/Time   LABOPIA NONE DETECTED 06/23/2015 0810   COCAINSCRNUR NONE DETECTED 06/23/2015 0810   LABBENZ POSITIVE* 06/23/2015 0810   AMPHETMU NONE DETECTED 06/23/2015 0810   THCU POSITIVE* 06/23/2015 0810   LABBARB NONE DETECTED 06/23/2015 0810    Urinalysis:  Recent Labs Lab 06/23/15 1210  COLORURINE YELLOW  LABSPEC >1.046*  PHURINE 6.0  GLUCOSEU NEGATIVE  HGBUR SMALL*  BILIRUBINUR NEGATIVE  KETONESUR NEGATIVE  PROTEINUR NEGATIVE  NITRITE NEGATIVE  LEUKOCYTESUR NEGATIVE   Medications: I have reviewed the patient's current medications. Scheduled Meds: . apixaban  5 mg Oral Q12H  . azithromycin  250 mg Oral Daily  . carvedilol  6.25 mg Oral BID WC  . diclofenac sodium  2 g Topical QID  . feeding supplement (ENSURE ENLIVE)  237 mL Oral BID BM  . Melatonin  3 mg Oral QHS  . pantoprazole  40 mg Oral Daily  . polyethylene glycol  17 g Oral Daily  . predniSONE  40 mg Oral Q breakfast  . senna-docusate  2 tablet Oral BID  . sodium chloride flush  3 mL Intravenous Q12H  . sodium chloride flush  3 mL Intravenous Q12H  . traZODone  50 mg Oral BID   Continuous Infusions: . amiodarone 30 mg/hr (06/26/15 2104)   PRN Meds:.sodium chloride, acetaminophen, acetaminophen, guaiFENesin-codeine,  ipratropium-albuterol, ondansetron (ZOFRAN) IV, sodium chloride flush Assessment/Plan:  [name] is a 70 y.o. male w/ PMHx of COPD, Chronic Hep C, PVD s/p RLE AKA, and tobacco abuse, admitted for shortness of breath.   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 He was found to have an EF 15-20% on TTE in the setting of orthopnea, elevated BNP, and effusion on CT. He underwent R/LHC yesterday (4/28) which revealed minimal CAD, making his severe LV dysfunction non-ischemic in nature. 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 HF team has started him on amio and hoping he will convert to sinus rhythm. If not, plan is to do TEE/DCCV on Monday.   Current chest pain likely 2/2 heart failure given increased BNP, negative troponin, and unchanged ECG. - Continue Azithro- will change to 250 mg PO daily for 2 more days (end date 06/28/15) - Continue Prednisone 40 mg x5 days (end date 06/28/15) - Continue Coreg 3.125 mg BID - Continue Eliquis and Amio for AFib as below - Duonebs q4h PRN - Guaifenesin-Codeine PRN for cough - Heart failure following, appreciate recs  Afib with RVR, improving: Patient now rate-controlled. CHADS-VASc is 3 (CHF, Age < 75, and PAD), so patient qualifies for anticoagulation.  He has previously been on warfarin and would prefer DOAC.  Heart failure has started Amio. - Continue Amio - Continue Eliquis - Plan is for TEE/DCCV on Monday if not converted  COPD: Patient with extensive bullous disease on CT, states he has been told he has emphysema in the past. Still smokes 1-2 cigs per day. No PFT's given that he receives his care at [facility].  - Duonebs as above - Prednisone - Azithro  Previous h/o Tb: Says this was diagnosed ~5 years ago at the [place], [place]. Fully treated. Spent 11 days in the hospital and had witnessed medication administration with the Health  Department. Obvious granulomatous disease on CTA.  -Obtain records from [facility]  Chronic Hepatitis C: Patient knows he has this. Mild LFT abnormalities. Has discussed treatment at the [place] in the past.  -Further management as an outpatient  Protein Calorie Malnutrition: Patient quite thin, ill-appearing, low albumin.  -Nutrition consult  Hematuria: Patient reported. Small Hgb and 0-5 RBC on U/A. Also with BPH symptoms.  Patient will need Urology f/u after discharge.  -Consider starting Flomax for BPH symptoms  DVT/PE PPx: Lovenox [place]  Dispo: Disposition is deferred at this time, awaiting improvement of  current medical problems.  Anticipated discharge in approximately 2-3 day(s).   The patient does have a current PCP (No primary care provider on file.) and does need an [facility] hospital follow-up appointment after discharge.  The patient does not have transportation limitations that hinder transportation to clinic appointments.  .Services Needed at time of discharge: Y = Yes, Blank = No PT:   OT:   RN:   Equipment:   Other:     LOS: 4 days   Jana Half, MD 06/27/2015, 8:09 AM

## 2015-06-28 ENCOUNTER — Encounter (HOSPITAL_COMMUNITY): Payer: Self-pay | Admitting: Internal Medicine

## 2015-06-28 DIAGNOSIS — J189 Pneumonia, unspecified organism: Secondary | ICD-10-CM

## 2015-06-28 DIAGNOSIS — R768 Other specified abnormal immunological findings in serum: Secondary | ICD-10-CM | POA: Insufficient documentation

## 2015-06-28 DIAGNOSIS — A159 Respiratory tuberculosis unspecified: Secondary | ICD-10-CM | POA: Insufficient documentation

## 2015-06-28 DIAGNOSIS — J431 Panlobular emphysema: Secondary | ICD-10-CM | POA: Insufficient documentation

## 2015-06-28 LAB — BASIC METABOLIC PANEL
Anion gap: 9 (ref 5–15)
Anion gap: 10 (ref 5–15)
BUN: 23 mg/dL — ABNORMAL HIGH (ref 6–20)
BUN: 24 mg/dL — ABNORMAL HIGH (ref 6–20)
CALCIUM: 8.3 mg/dL — AB (ref 8.9–10.3)
CHLORIDE: 100 mmol/L — AB (ref 101–111)
CHLORIDE: 100 mmol/L — AB (ref 101–111)
CO2: 25 mmol/L (ref 22–32)
CO2: 27 mmol/L (ref 22–32)
CREATININE: 0.85 mg/dL (ref 0.61–1.24)
CREATININE: 1.04 mg/dL (ref 0.61–1.24)
Calcium: 8.1 mg/dL — ABNORMAL LOW (ref 8.9–10.3)
Glucose, Bld: 86 mg/dL (ref 65–99)
Glucose, Bld: 94 mg/dL (ref 65–99)
Potassium: 6 mmol/L — ABNORMAL HIGH (ref 3.5–5.1)
Potassium: 3.8 mmol/L (ref 3.5–5.1)
SODIUM: 134 mmol/L — AB (ref 135–145)
SODIUM: 137 mmol/L (ref 135–145)

## 2015-06-28 MED ORDER — FUROSEMIDE 20 MG PO TABS
20.0000 mg | ORAL_TABLET | ORAL | Status: AC
  Administered 2015-06-28: 20 mg via ORAL
  Filled 2015-06-28: qty 1

## 2015-06-28 MED ORDER — ATORVASTATIN CALCIUM 40 MG PO TABS: 40.0000 mg | ORAL_TABLET | Freq: Every day | ORAL | Status: DC

## 2015-06-28 MED ORDER — CARVEDILOL 6.25 MG PO TABS: 6.2500 mg | ORAL_TABLET | Freq: Two times a day (BID) | ORAL | Status: DC

## 2015-06-28 MED ORDER — SPIRONOLACTONE 25 MG PO TABS: 12.5000 mg | ORAL_TABLET | Freq: Every day | ORAL | Status: DC

## 2015-06-28 MED ORDER — LOSARTAN POTASSIUM 25 MG PO TABS
25.0000 mg | ORAL_TABLET | Freq: Every day | ORAL | Status: DC
Start: 2015-06-28 — End: 2015-12-03

## 2015-06-28 MED ORDER — APIXABAN 5 MG PO TABS: 5.0000 mg | ORAL_TABLET | Freq: Two times a day (BID) | ORAL | Status: DC

## 2015-06-28 MED ORDER — MAGNESIUM SULFATE 2 GM/50ML IV SOLN
2.0000 g | Freq: Once | INTRAVENOUS | Status: AC
  Administered 2015-06-28: 2 g via INTRAVENOUS
  Filled 2015-06-28 (×2): qty 50

## 2015-06-28 MED ORDER — AMIODARONE HCL 200 MG PO TABS: 200.0000 mg | ORAL_TABLET | Freq: Two times a day (BID) | ORAL | Status: DC

## 2015-06-28 MED ORDER — LOSARTAN POTASSIUM 25 MG PO TABS: 25.0000 mg | ORAL_TABLET | Freq: Every day | ORAL | Status: DC

## 2015-06-28 MED ORDER — ATORVASTATIN CALCIUM 40 MG PO TABS
40.0000 mg | ORAL_TABLET | Freq: Every day | ORAL | Status: DC
  Administered 2015-06-28: 40 mg via ORAL
  Filled 2015-06-28: qty 1

## 2015-06-28 MED ORDER — GUAIFENESIN-CODEINE 100-10 MG/5ML PO SOLN: 10.0000 mL | Freq: Four times a day (QID) | ORAL | Status: DC | PRN

## 2015-06-28 NOTE — Progress Notes (Addendum)
DAILY PROGRESS NOTE  Subjective:   Feels good. Still some SOB. Maintaining NSR. Arlyce Harman and losartan started yesterday. SBP ok. No bleeding on Eliquis.   K 6.0 (hemolyzed)  Objective:  Temp:  [97.5 F (36.4 C)-97.8 F (36.6 C)] 97.8 F (36.6 C) (05/01 0524) Pulse Rate:  [58-59] 59 (05/01 0524) Resp:  [18-20] 18 (05/01 0524) BP: (111-121)/(72-75) 112/73 mmHg (05/01 0524) SpO2:  [95 %-98 %] 98 % (05/01 0524) Weight change:   Intake/Output from previous day: 04/30 0701 - 05/01 0700 In: -  Out: 400 [Urine:400]  Intake/Output from this shift:    Medications: Current Facility-Administered Medications  Medication Dose Route Frequency Provider Last Rate Last Dose  . acetaminophen (TYLENOL) tablet 650 mg  650 mg Oral Q6H PRN Iline Oven, MD   650 mg at 06/24/15 1329  . acetaminophen (TYLENOL) tablet 650 mg  650 mg Oral Q4H PRN Jolaine Artist, MD   650 mg at 06/27/15 0946  . amiodarone (PACERONE) tablet 200 mg  200 mg Oral BID Jolaine Artist, MD   200 mg at 06/27/15 1817  . apixaban (ELIQUIS) tablet 5 mg  5 mg Oral Q12H Jolaine Artist, MD   5 mg at 06/28/15 3428  . azithromycin (ZITHROMAX) tablet 250 mg  250 mg Oral Daily Juliet Rude, MD   250 mg at 06/27/15 0947  . carvedilol (COREG) tablet 6.25 mg  6.25 mg Oral BID WC Pixie Casino, MD   6.25 mg at 06/27/15 1817  . diclofenac sodium (VOLTAREN) 1 % transdermal gel 2 g  2 g Topical QID Iline Oven, MD   2 g at 06/27/15 2215  . feeding supplement (ENSURE ENLIVE) (ENSURE ENLIVE) liquid 237 mL  237 mL Oral BID BM Bartholomew Crews, MD      . guaiFENesin-codeine 100-10 MG/5ML solution 10 mL  10 mL Oral Q6H PRN Corky Sox, MD   10 mL at 06/23/15 2247  . ipratropium-albuterol (DUONEB) 0.5-2.5 (3) MG/3ML nebulizer solution 3 mL  3 mL Nebulization Q4H PRN Corky Sox, MD   3 mL at 06/25/15 0815  . losartan (COZAAR) tablet 25 mg  25 mg Oral QHS Jolaine Artist, MD   25 mg at 06/27/15 2209  . Melatonin  TABS 3 mg  3 mg Oral QHS Bartholomew Crews, MD   3 mg at 06/27/15 2209  . ondansetron (ZOFRAN) injection 4 mg  4 mg Intravenous Q6H PRN Jolaine Artist, MD      . pantoprazole (PROTONIX) EC tablet 40 mg  40 mg Oral Daily Iline Oven, MD   40 mg at 06/27/15 0946  . polyethylene glycol (MIRALAX / GLYCOLAX) packet 17 g  17 g Oral Daily Iline Oven, MD   17 g at 06/26/15 1104  . senna-docusate (Senokot-S) tablet 2 tablet  2 tablet Oral BID Iline Oven, MD   2 tablet at 06/27/15 2210  . spironolactone (ALDACTONE) tablet 12.5 mg  12.5 mg Oral Daily Jolaine Artist, MD   12.5 mg at 06/27/15 1500  . traZODone (DESYREL) tablet 50 mg  50 mg Oral BID Iline Oven, MD   50 mg at 06/27/15 2209    Physical Exam: General: Lying in bed. NAD HEENT: normal Neck: supple. JVP 6 Carotids 2+ bilat; no bruits. No lymphadenopathy or thyromegaly appreciated. Cor: PMI nondisplaced.RRR No M/G/R appreciated but difficult with fast rate. Lungs: decreasedBS throughout  No wheezing Abdomen: soft, nontender, nondistended. No hepatosplenomegaly.  No bruits or masses. Good bowel sounds. Extremities: no cyanosis, clubbing, rash, edema. S/p R AKA.  Neuro: alert & orientedx3, cranial nerves grossly intact. moves all 4 extremities w/o difficulty. Affect pleasant  Lab Results: Results for orders placed or performed during the hospital encounter of 06/23/15 (from the past 48 hour(s))  Basic metabolic panel     Status: Abnormal   Collection Time: 06/27/15  4:31 AM  Result Value Ref Range   Sodium 135 135 - 145 mmol/L   Potassium 3.8 3.5 - 5.1 mmol/L   Chloride 97 (L) 101 - 111 mmol/L   CO2 28 22 - 32 mmol/L   Glucose, Bld 181 (H) 65 - 99 mg/dL   BUN 22 (H) 6 - 20 mg/dL   Creatinine, Ser 6.61 0.61 - 1.24 mg/dL   Calcium 8.2 (L) 8.9 - 10.3 mg/dL   GFR calc non Af Amer >60 >60 mL/min   GFR calc Af Amer >60 >60 mL/min    Comment: (NOTE) The eGFR has been calculated using the CKD EPI  equation. This calculation has not been validated in all clinical situations. eGFR's persistently <60 mL/min signify possible Chronic Kidney Disease.    Anion gap 10 5 - 15  Brain natriuretic peptide     Status: Abnormal   Collection Time: 06/27/15  8:15 AM  Result Value Ref Range   B Natriuretic Peptide 739.9 (H) 0.0 - 100.0 pg/mL  Troponin I     Status: None   Collection Time: 06/27/15  8:15 AM  Result Value Ref Range   Troponin I 0.03 <0.031 ng/mL    Comment:        NO INDICATION OF MYOCARDIAL INJURY.   Basic metabolic panel     Status: Abnormal   Collection Time: 06/28/15  3:48 AM  Result Value Ref Range   Sodium 134 (L) 135 - 145 mmol/L   Potassium 6.0 (H) 3.5 - 5.1 mmol/L    Comment: DELTA CHECK NOTED SPECIMEN HEMOLYZED. HEMOLYSIS MAY AFFECT INTEGRITY OF RESULTS.    Chloride 100 (L) 101 - 111 mmol/L   CO2 25 22 - 32 mmol/L   Glucose, Bld 86 65 - 99 mg/dL   BUN 23 (H) 6 - 20 mg/dL   Creatinine, Ser 5.11 0.61 - 1.24 mg/dL   Calcium 8.1 (L) 8.9 - 10.3 mg/dL   GFR calc non Af Amer >60 >60 mL/min   GFR calc Af Amer >60 >60 mL/min    Comment: (NOTE) The eGFR has been calculated using the CKD EPI equation. This calculation has not been validated in all clinical situations. eGFR's persistently <60 mL/min signify possible Chronic Kidney Disease.    Anion gap 9 5 - 15    Imaging: No results found.  Assessment:  1. Acute systolic HF - Newly reduced EF - Echo 06/24/15 LVEF 15-20%, Mod MR, Severe LAE, Normal RV, Mild TR, PA peak pressure  --cath this admit with no CAD 2. A fib with RVR - now back in NSR on amio   --on Eliquis 3. COPD with ongoing tobacco use 4. H/o TB 5. Chronic Hep C - Has not had treatment.  6. PAD with R AKA  Plan:  Suspect he has tachy-mediated CM. Now back in NSR on amio. Volume status looks good. But c/o SOB. On carvedilol, spiro and losartan. Continue Eliquis. Can send home on these meds. Repeating BMET due to hemolyzed sample.   Cardiac  meds on d/c: (I have placed CM consult to make sure he can afford)  Eliquis 5 bid (no need for ASA with Eliquis) Spiro 12.5 daily  Losartan 25 mg daily Carvedilol 6.25 mg bid Lasix '20mg'$  M/W/F Amiodarone 200 bid Atorva 40 daily    F/i in HF Clinic in 1 week with BMET,.    Length of Stay:  LOS: 5 days  Glori Bickers MD 06/28/2015, 8:32 AM

## 2015-06-28 NOTE — Care Management Note (Signed)
Case Management Note Previous CM note initiated by Antony Haste, RN, CM   Patient Details  Name: [name] MRN: [id number] Date of Birth: 01/31/1946  Subjective/Objective:                  CHF  Action/Plan: CM spoke with patient at the bedside. Provided with an Eliquis 30 day free card. States he has Medicare, Medicaid and receives services from the [place]. He states he will have to be seen at the [facility] for them to provide his Eliquis. Patient lives at home with his ex-wife, daughter and granddaughter. Reports no DME needs. He has a cane and a prosthesis for a right AKA.   Expected Discharge Date:    06/28/15        Expected Discharge Plan:  Home/Self Care  In-House Referral:     Discharge planning Services  CM Consult, Medication Assistance, MATCH Program, Indigent Health Clinic  Post Acute Care Choice:  Durable Medical Equipment Choice offered to:  Patient  DME Arranged:  [facility] rolling DME Agency:  [facility]  HH Arranged:    [facility] Agency:     Status of Service:  Completed, signed off  Medicare Important Message Given:  Yes Date Medicare IM Given:    Medicare IM give by:    Date Additional Medicare IM Given:    Additional Medicare Important Message give by:     If discussed at Long Length of Stay Meetings, dates discussed:    Additional Comments:  06/28/15- 1200- Donn Pierini RN, BSN - pt for d/c home today- recently moved here from [place]- per conversation with pt he used to go to the [employer]- and [place] clinic - and that is where he got all his medications- does not have part D Medicare- [facility]- info and contact for the [facility] along with the [facility] given to pt and daughter- also given application for Medicaid per request- pt started on Eliquis and has 30 day free card- medications reviewed and pt will need assistance throught [facility]- letter provided to pt and program explained to pt along with $3 copay  cost per prescription pt understands along with need to use one of the listed pharmacies that participate with MATCH. MD to provide paper scripts. RW ordered and has been delivered to room for pt to take home. Pt to f/u with HF clinic.   Darrold Span, RN 06/28/2015, 3:29 PM

## 2015-06-28 NOTE — Progress Notes (Signed)
Subjective: Mr. Tenner had no acute events overnight. This morning, he feels well. He still has intermittent shortness of breath with occasional nonproductive cough which is much better than previously. He denies chest pain, palpitations, increased leg swelling, or other symptoms at this time.  Objective: Vital signs in last 24 hours: Filed Vitals:   06/27/15 0401 06/27/15 1426 06/27/15 2146 06/28/15 0524  BP: 106/83 121/72 111/75 112/73  Pulse: 88 59 58 59  Temp: 97.5 F (36.4 C) 97.6 F (36.4 C) 97.5 F (36.4 C) 97.8 F (36.6 C)  TempSrc: Oral Oral Oral Oral  Resp: 21 20 18 18   Height:      Weight:      SpO2: 100% 95% 96% 98%   Weight change:   Intake/Output Summary (Last 24 hours) at 06/28/15 1019 Last data filed at 06/28/15 0849  Gross per 24 hour  Intake      0 ml  Output    900 ml  Net   -900 ml      Gen: Well-appearing, alert and oriented to person, place, and time HEENT: Oropharynx clear without erythema or exudate.  Neck: No cervical LAD, no thyromegaly or nodules, no JVD noted. CV: Normal rate, regular rhythm, no murmurs, rubs, or gallops Pulmonary: Normal effort, CTA bilaterally, no crackles or wheezes Abdominal: Soft, non-tender, non-distended, without rebound, guarding, or masses Extremities: Distal pulses 2+ in upper and lower extremities without edema on L, s/p R AKA.  Neuro: CN II-XII grossly intact, no focal weakness or sensory deficits noted  Lab Results: Basic Metabolic Panel:  Recent Labs Lab 06/23/15 0615  06/26/15 0550 06/27/15 0431 06/28/15 0348  NA 138  < > 140 135 134*  K 4.3  < > 4.2 3.8 6.0*  CL 103  < > 106 97* 100*  CO2 22  < > 25 28 25   GLUCOSE 122*  < > 103* 181* 86  BUN 11  < > 15 22* 23*  CREATININE 0.85  < > 0.97 0.92 0.85  CALCIUM 8.8*  < > 8.4* 8.2* 8.1*  MG 1.7  --  1.7  --   --   < > = values in this interval not displayed.  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 He was found to have an EF 15-20% on TTE in the setting of orthopnea, elevated BNP, and effusion on CT. He underwent [facility] 4/28 which revealed minimal CAD, making his severe LV dysfunction non-ischemic in nature. 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 HF team has started him on amio and hoping he will convert to sinus rhythm. If not, plan is to do TEE/DCCV on Monday. Current chest pain likely 2/2 heart failure given increased BNP, negative troponin, and unchanged ECG. - S/p azithromycin and prednisone for CAP/COPD exacerbation - Continue Spiro, Losartan, Carvedilol, Lasix  - Continue Eliquis and Amio for AFib as below - Duonebs q4h PRN - Guaifenesin-Codeine PRN for cough - Heart failure following, appreciate recs - will follow in clinic in 1 week  Afib with RVR, improving: Patient now rate-controlled. CHADS-VASc is 3 (CHF, Age < 75, and PAD), so patient qualifies for anticoagulation. He has previously been  on warfarin and would prefer DOAC. Heart failure has started Amio. Converted spontaneously to NSR, so DCCV deferred - Continue Amio - Continue Eliquis  Previous h/o Tb: Says this was diagnosed ~5 years ago at the [place], [place]. Fully treated. Spent 11 days in the hospital and had witnessed medication administration with the [facility]. Obvious granulomatous disease on CTA.  -Obtain records from [facility]  Chronic Hepatitis C: Patient knows he has this. Mild LFT abnormalities. Has discussed treatment at the [place] in the past.  -Further management as an outpatient  Hematuria: Patient reported. Small Hgb and 0-5 RBC on U/A. Also with BPH symptoms. Patient will need Urology f/u after discharge.  -Consider starting Flomax for BPH  symptoms  DVT/PE PPx: Lovenox [place]  Dispo: Disposition is deferred at this time, awaiting improvement of current medical problems.  Anticipated discharge today.  The [facility] and does need an [facility] hospital follow-up appointment after discharge.  The patient does have transportation limitations that hinder transportation to clinic appointments.  LOS: 5 days   Darrick Huntsman, MD 06/28/2015, 10:19 AM

## 2015-06-28 NOTE — Progress Notes (Signed)
Discharge instructions , RX's and follow up appt explained to family and patient verbalized understanding Patient d/c home with family left floor via wheelchair accompanied by staff. No c/o pain or shortness of breath at d/c.  Arshdeep Bolger Lynn, RN               

## 2015-06-28 NOTE — Progress Notes (Signed)
Physical Therapy Treatment and discharge Patient Details Name: [name] MRN: [id number] DOB: 08/03/1945 Today's Date: 06/28/2015    History of Present Illness Pt is a 70 y/o M admitted w/ worsening SOB and cough.  Dx: acute systolic HF, newly reduced EF 15-20%.  Pt's PMH includes hepatitis C, Rt AKA, TB, COPD, HTN.    PT Comments    Pt progressing well, ambulated 150' with supervision, used RW which was very helpful to him, recommend for home. Pt had some difficulty donning RLE prosthesis but was able to do so after several tries. Some of his sleeves are very worn out, recommend replacing. Pt has met acute PT goals and plan is for d/c today, d/c from acute therapy at this time.    Follow Up Recommendations  No PT follow up;Supervision for mobility/OOB     Equipment Recommendations  Rolling walker with 5" wheels    Recommendations for Other Services       Precautions / Restrictions Precautions Precautions: Fall Required Braces or Orthoses: Other Brace/Splint Other Brace/Splint: Lt LE prosthesis Restrictions Weight Bearing Restrictions: No    Mobility  Bed Mobility Overal bed mobility: Independent                Transfers Overall transfer level: Modified independent Equipment used: Rolling walker (2 wheeled) Transfers: Sit to/from Stand Sit to Stand: Modified independent (Device/Increase time)         General transfer comment: pt stood with mod I and used RW for support while he donned RLE prosthesis.   Ambulation/Gait Ambulation/Gait assistance: Supervision Ambulation Distance (Feet): 150 Feet Assistive device: Rolling walker (2 wheeled) Gait Pattern/deviations: Decreased weight shift to right;Decreased stance time - right Gait velocity: decreased Gait velocity interpretation: Below normal speed for age/gender General Gait Details: Pt circumducts RLE for swing through, this is his normal pattern with prosthesis. He reports feeling safer with RW than  his SPC. HR maintained low 70's O2 sats 94% on RA   Stairs            Wheelchair Mobility    Modified Rankin (Stroke Patients Only)       Balance Overall balance assessment: Modified Independent Sitting-balance support: No upper extremity supported Sitting balance-Leahy Scale: Good       Standing balance-Leahy Scale: Good Standing balance comment: once prosthesis donned, pt able to stand without UE support                     Cognition Arousal/Alertness: Awake/alert Behavior During Therapy: WFL for tasks assessed/performed Overall Cognitive Status: Within Functional Limits for tasks assessed                      Exercises      General Comments General comments (skin integrity, edema, etc.): pt with some parts for prsothesis that are very worn, recommended that pt look into getting new sleeves      Pertinent Vitals/Pain Pain Assessment: No/denies pain  HR 69-71 bpm, O2 sats 94% on RA    Home Living                      Prior Function            PT Goals (current goals can now be found in the care plan section) Acute Rehab PT Goals Patient Stated Goal: to get back home  PT Goal Formulation: With patient Time For Goal Achievement: 07/10/15 Potential to Achieve Goals: Good Progress towards PT  goals: Goals met/education completed, patient discharged from PT    Frequency  Min 3X/week    PT Plan Equipment recommendations need to be updated    Co-evaluation             End of Session Equipment Utilized During Treatment: Gait belt Activity Tolerance: Patient tolerated treatment well Patient left: in chair;with call bell/phone within reach;with family/visitor present     Time: 1006-1030 PT Time Calculation (min) (ACUTE ONLY): 24 min  Charges:  $Gait Training: 23-37 mins                    G Codes:     Leighton Roach, PT  Acute Rehab Services  [phone]  Leighton Roach 06/28/2015, 12:45 PM

## 2015-07-03 ENCOUNTER — Telehealth: Payer: Self-pay | Admitting: Nurse Practitioner

## 2015-07-03 NOTE — Telephone Encounter (Signed)
   Pt called to report that since d/c he has had trouble sleeping.  I rec that he call his PCP or is welcome to try an over the counter sleep aid.  He also notes mild swelling in his ankles.  He did have country ham and gravy the other day.  His wt is down since d/c - currently @ 112 lbs.  He denies chest pain, palpitations, dyspnea, pnd, orthopnea, n, v, dizziness, syncope, weight gain, or early satiety. I rec he avoid salty/processed/restaurant foods and f/u in CHF clinic as schedule - 5/9.  Caller verbalized understanding and was grateful for the call back.  Nicolasa Ducking, NP 07/03/2015, 10:43 AM

## 2015-07-06 ENCOUNTER — Inpatient Hospital Stay (HOSPITAL_COMMUNITY): Admit: 2015-07-06 | Payer: Medicare Other

## 2015-07-06 ENCOUNTER — Telehealth (HOSPITAL_COMMUNITY): Payer: Self-pay

## 2015-07-06 NOTE — Telephone Encounter (Signed)
Patient called CHF clinic triage line to report he will be unable to make his apt this afternoon. He depends on his daughter from transportation and her car is not working at this time. Patient declines offer for taxi service. Will call us back when he knows he has reliable transportation to make another apt.  Ave Filter

## 2015-07-17 ENCOUNTER — Telehealth: Payer: Self-pay | Admitting: Internal Medicine

## 2015-07-17 NOTE — Telephone Encounter (Signed)
Cardiology [facility]  The patient called to report that he has been having 1-2 episodes of diarrhea since he left the hospital 06/30/15.  He wanted to know if any of the medications he started could be causing this.  While beta-blockers can cause diarrhea in unusual circumstances, I advised that he stay hydrated & seek attention from his PCP to rule-out infection as an alternate explanation related to his recent hospitalization (06/23/15 to 06/30/15).  He understood & was grateful for the callback.  Lance Morin, MD

## 2015-11-28 ENCOUNTER — Emergency Department (HOSPITAL_COMMUNITY): Payer: Medicare Other

## 2015-11-28 ENCOUNTER — Encounter (HOSPITAL_COMMUNITY): Payer: Self-pay | Admitting: Emergency Medicine

## 2015-11-28 ENCOUNTER — Inpatient Hospital Stay (HOSPITAL_COMMUNITY)
Admission: EM | Admit: 2015-11-28 | Discharge: 2015-12-03 | DRG: 441 | Disposition: A | Discharge status: Home / Self Care | Payer: Medicare Other | Attending: Internal Medicine | Admitting: Internal Medicine

## 2015-11-28 DIAGNOSIS — G9340 Encephalopathy, unspecified: Secondary | ICD-10-CM | POA: Diagnosis present

## 2015-11-28 DIAGNOSIS — I509 Heart failure, unspecified: Secondary | ICD-10-CM | POA: Diagnosis present

## 2015-11-28 DIAGNOSIS — K7682 Hepatic encephalopathy: Secondary | ICD-10-CM

## 2015-11-28 DIAGNOSIS — Z23 Encounter for immunization: Secondary | ICD-10-CM

## 2015-11-28 DIAGNOSIS — K7460 Unspecified cirrhosis of liver: Secondary | ICD-10-CM

## 2015-11-28 DIAGNOSIS — B182 Chronic viral hepatitis C: Secondary | ICD-10-CM | POA: Diagnosis present

## 2015-11-28 DIAGNOSIS — Z7901 Long term (current) use of anticoagulants: Secondary | ICD-10-CM

## 2015-11-28 DIAGNOSIS — E43 Unspecified severe protein-calorie malnutrition: Secondary | ICD-10-CM | POA: Diagnosis present

## 2015-11-28 DIAGNOSIS — F17210 Nicotine dependence, cigarettes, uncomplicated: Secondary | ICD-10-CM | POA: Diagnosis present

## 2015-11-28 DIAGNOSIS — I4891 Unspecified atrial fibrillation: Secondary | ICD-10-CM | POA: Diagnosis present

## 2015-11-28 DIAGNOSIS — E46 Unspecified protein-calorie malnutrition: Secondary | ICD-10-CM

## 2015-11-28 DIAGNOSIS — Z86718 Personal history of other venous thrombosis and embolism: Secondary | ICD-10-CM

## 2015-11-28 DIAGNOSIS — R4182 Altered mental status, unspecified: Secondary | ICD-10-CM | POA: Diagnosis not present

## 2015-11-28 DIAGNOSIS — K7290 Hepatic failure, unspecified without coma: Secondary | ICD-10-CM

## 2015-11-28 DIAGNOSIS — J449 Chronic obstructive pulmonary disease, unspecified: Secondary | ICD-10-CM

## 2015-11-28 DIAGNOSIS — Z79899 Other long term (current) drug therapy: Secondary | ICD-10-CM

## 2015-11-28 DIAGNOSIS — Z681 Body mass index (BMI) 19 or less, adult: Secondary | ICD-10-CM

## 2015-11-28 DIAGNOSIS — L8990 Pressure ulcer of unspecified site, unspecified stage: Secondary | ICD-10-CM | POA: Insufficient documentation

## 2015-11-28 DIAGNOSIS — R768 Other specified abnormal immunological findings in serum: Secondary | ICD-10-CM | POA: Diagnosis present

## 2015-11-28 DIAGNOSIS — I739 Peripheral vascular disease, unspecified: Secondary | ICD-10-CM

## 2015-11-28 DIAGNOSIS — R404 Transient alteration of awareness: Secondary | ICD-10-CM

## 2015-11-28 DIAGNOSIS — I480 Paroxysmal atrial fibrillation: Secondary | ICD-10-CM | POA: Diagnosis present

## 2015-11-28 DIAGNOSIS — Z89611 Acquired absence of right leg above knee: Secondary | ICD-10-CM

## 2015-11-28 DIAGNOSIS — K219 Gastro-esophageal reflux disease without esophagitis: Secondary | ICD-10-CM | POA: Diagnosis present

## 2015-11-28 DIAGNOSIS — Z7982 Long term (current) use of aspirin: Secondary | ICD-10-CM

## 2015-11-28 DIAGNOSIS — R64 Cachexia: Secondary | ICD-10-CM | POA: Diagnosis present

## 2015-11-28 DIAGNOSIS — I110 Hypertensive heart disease with heart failure: Secondary | ICD-10-CM | POA: Diagnosis present

## 2015-11-28 HISTORY — DX: Anxiety disorder, unspecified: F41.9

## 2015-11-28 HISTORY — DX: Headache, unspecified: R51.9

## 2015-11-28 HISTORY — DX: Unspecified viral hepatitis C without hepatic coma: B19.20

## 2015-11-28 HISTORY — DX: Hepatic encephalopathy: K76.82

## 2015-11-28 HISTORY — DX: Personal history of other medical treatment: Z92.89

## 2015-11-28 HISTORY — DX: Essential (primary) hypertension: I10

## 2015-11-28 HISTORY — DX: Calculus of kidney: N20.0

## 2015-11-28 HISTORY — DX: Pure hypercholesterolemia, unspecified: E78.00

## 2015-11-28 HISTORY — DX: Hepatic failure, unspecified without coma: K72.90

## 2015-11-28 HISTORY — DX: Pneumonia, unspecified organism: J18.9

## 2015-11-28 HISTORY — DX: Gastro-esophageal reflux disease without esophagitis: K21.9

## 2015-11-28 HISTORY — DX: Unspecified cirrhosis of liver: K74.60

## 2015-11-28 HISTORY — DX: Respiratory tuberculosis unspecified: A15.9

## 2015-11-28 HISTORY — DX: Unspecified osteoarthritis, unspecified site: M19.90

## 2015-11-28 HISTORY — DX: Headache: R51

## 2015-11-28 LAB — URINALYSIS, ROUTINE W REFLEX MICROSCOPIC
Bilirubin Urine: NEGATIVE
GLUCOSE, UA: NEGATIVE mg/dL
HGB URINE DIPSTICK: NEGATIVE
KETONES UR: NEGATIVE mg/dL
Leukocytes, UA: NEGATIVE
Nitrite: NEGATIVE
PROTEIN: NEGATIVE mg/dL
Specific Gravity, Urine: 1.02 (ref 1.005–1.030)
pH: 6 (ref 5.0–8.0)

## 2015-11-28 LAB — DIFFERENTIAL
BASOS ABS: 0 10*3/uL (ref 0.0–0.1)
Basophils Relative: 1 %
EOS PCT: 4 %
Eosinophils Absolute: 0.2 10*3/uL (ref 0.0–0.7)
LYMPHS ABS: 1.8 10*3/uL (ref 0.7–4.0)
Lymphocytes Relative: 37 %
MONOS PCT: 14 %
Monocytes Absolute: 0.7 10*3/uL (ref 0.1–1.0)
Neutro Abs: 2.1 10*3/uL (ref 1.7–7.7)
Neutrophils Relative %: 44 %

## 2015-11-28 LAB — I-STAT CHEM 8, ED
BUN: 27 mg/dL — AB (ref 6–20)
CREATININE: 0.9 mg/dL (ref 0.61–1.24)
Calcium, Ion: 1.12 mmol/L — ABNORMAL LOW (ref 1.15–1.40)
Chloride: 102 mmol/L (ref 101–111)
Glucose, Bld: 103 mg/dL — ABNORMAL HIGH (ref 65–99)
HEMATOCRIT: 41 % (ref 39.0–52.0)
Hemoglobin: 13.9 g/dL (ref 13.0–17.0)
POTASSIUM: 5 mmol/L (ref 3.5–5.1)
Sodium: 142 mmol/L (ref 135–145)
TCO2: 33 mmol/L (ref 0–100)

## 2015-11-28 LAB — RAPID URINE DRUG SCREEN, HOSP PERFORMED
Amphetamines: NOT DETECTED
BARBITURATES: NOT DETECTED
BENZODIAZEPINES: NOT DETECTED
COCAINE: NOT DETECTED
Opiates: NOT DETECTED
TETRAHYDROCANNABINOL: POSITIVE — AB

## 2015-11-28 LAB — CBC
HEMATOCRIT: 41.3 % (ref 39.0–52.0)
HEMOGLOBIN: 13.5 g/dL (ref 13.0–17.0)
MCH: 32.1 pg (ref 26.0–34.0)
MCHC: 32.7 g/dL (ref 30.0–36.0)
MCV: 98.1 fL (ref 78.0–100.0)
Platelets: 131 10*3/uL — ABNORMAL LOW (ref 150–400)
RBC: 4.21 MIL/uL — ABNORMAL LOW (ref 4.22–5.81)
RDW: 12.9 % (ref 11.5–15.5)
WBC: 4.8 10*3/uL (ref 4.0–10.5)

## 2015-11-28 LAB — COMPREHENSIVE METABOLIC PANEL
ALBUMIN: 3.6 g/dL (ref 3.5–5.0)
ALK PHOS: 83 U/L (ref 38–126)
ALT: 81 U/L — AB (ref 17–63)
ANION GAP: 8 (ref 5–15)
AST: 89 U/L — ABNORMAL HIGH (ref 15–41)
BILIRUBIN TOTAL: 1 mg/dL (ref 0.3–1.2)
BUN: 17 mg/dL (ref 6–20)
CALCIUM: 9 mg/dL (ref 8.9–10.3)
CO2: 27 mmol/L (ref 22–32)
CREATININE: 0.84 mg/dL (ref 0.61–1.24)
Chloride: 104 mmol/L (ref 101–111)
GFR calc Af Amer: >60 mL/min (ref >60)
GFR calc non Af Amer: >60 mL/min (ref >60)
GLUCOSE: 101 mg/dL — AB (ref 65–99)
Potassium: 5.1 mmol/L (ref 3.5–5.1)
Sodium: 139 mmol/L (ref 135–145)
TOTAL PROTEIN: 6.5 g/dL (ref 6.5–8.1)

## 2015-11-28 LAB — I-STAT TROPONIN, ED: Troponin i, poc: 0 ng/mL (ref 0.00–0.08)

## 2015-11-28 LAB — TSH: TSH: 1.659 u[IU]/mL (ref 0.350–4.500)

## 2015-11-28 LAB — PROTIME-INR
INR: 1.04
Prothrombin Time: 13.6 seconds (ref 11.4–15.2)

## 2015-11-28 LAB — CBG MONITORING, ED: GLUCOSE-CAPILLARY: 102 mg/dL — AB (ref 65–99)

## 2015-11-28 LAB — AMMONIA: Ammonia: 58 umol/L — ABNORMAL HIGH (ref 9–35)

## 2015-11-28 LAB — APTT: aPTT: 27 seconds (ref 24–36)

## 2015-11-28 LAB — ETHANOL: Alcohol, Ethyl (B): <5 mg/dL (ref <5)

## 2015-11-28 MED ORDER — SODIUM CHLORIDE 0.9 % IV BOLUS (SEPSIS)
1000.0000 mL | Freq: Once | INTRAVENOUS | Status: AC
  Administered 2015-11-28: 1000 mL via INTRAVENOUS

## 2015-11-28 NOTE — ED Triage Notes (Signed)
Pt arrives from home via [facility] as Code Stroke, LSN 1600.  EMS reports family reports slurred speech, generalized weakness with AMS.   EMS reports increasing aphasia en route.  Pt unable to follow commands on arrival.  Resp e/u.

## 2015-11-28 NOTE — Consult Note (Signed)
Neurology Consult Note  Reason for Consultation: CODE STROKE  Requesting provider: Melene Plan  CC: Patient unable to provide due to apparent encephalopathy  HPI: This is a 69-yo man who is brought in by EMS after developing slurred speech at home. History is obtained from EMS as well as from family at the bedside.   His family reports that he was in his usual state of health until this afternoon. They had eaten a large dinner. They were cleaning up in the kitchen when they noticed that he was slumped forward in his chair. When they checked him, they noted that he was trying to pick things up off the floor in front of him even though there was nothing there. He would not speak to them. They called 911 and he was transported to the ED for evaluation. EMS reported generalized weakness and decreased verbal output en route. He was taken for an emergent CT of the head that did not show any acute abnormality. He remained encephalopathic and agitated in the ED. No focal neurologic deficits were identified.   His family reports that he has not seen a physician since April 2017 when he was admitted to [facility] for shortness of breath and cough. He was treated for pnuemonia. During that admission he had atrial fibrillation with RVR. At discharge he was placed on amiodarone, apixaban, atorvastatin, carvedilol, losartan, and spironolactone. His family reports that he has not taken any medication for months. He smokes marijuana--per family they say he only smokes the first week of each month after he gets his check. They believe he smoked today--EMS stated that there was evidence of marijuana use on the scene.   Last known well: 1600 NIHSS score: 2 tPA given?: No, diagnosis unclear  PMH:  Past Medical History:  Diagnosis Date  . COPD (chronic obstructive pulmonary disease) (HCC)   . PVD (peripheral vascular disease) (HCC)     PSH:  Past Surgical History:  Procedure Laterality Date  . CARDIAC  CATHETERIZATION N/A 06/25/2015   Procedure: Right/Left Heart Cath and Coronary Angiography;  Surgeon: Dolores Patty, MD;  Location: [facility] INVASIVE CV LAB;  Service: Cardiovascular;  Laterality: N/A;    Family history: No family history on file.  Social history:  Social History   Social History  . Marital status: Single    Spouse name: N/A  . Number of children: N/A  . Years of education: N/A   Occupational History  . Not on file.   Social History Main Topics  . Smoking status: Current Some Day Smoker  . Smokeless tobacco: Not on file  . Alcohol use No  . Drug use:     Types: Marijuana  . Sexual activity: Not on file   Other Topics Concern  . Not on file   Social History Narrative  . No narrative on file    Current outpatient meds: No outpatient prescriptions have been marked as taking for the 11/28/15 encounter [facility] Encounter).    Current inpatient meds:  No current facility-administered medications for this encounter.    Current Outpatient Prescriptions  Medication Sig Dispense Refill  . amiodarone (PACERONE) 200 MG tablet Take 1 tablet (200 mg total) by mouth 2 (two) times daily. 60 tablet 3  . apixaban (ELIQUIS) 5 MG TABS tablet Take 1 tablet (5 mg total) by mouth every 12 (twelve) hours. 60 tablet 3  . atorvastatin (LIPITOR) 40 MG tablet Take 1 tablet (40 mg total) by mouth daily at 6 PM. 30 tablet 3  .  carvedilol (COREG) 6.25 MG tablet Take 1 tablet (6.25 mg total) by mouth 2 (two) times daily with a meal. 60 tablet 3  . losartan (COZAAR) 25 MG tablet Take 1 tablet (25 mg total) by mouth at bedtime. 30 tablet 3  . spironolactone (ALDACTONE) 25 MG tablet Take 0.5 tablets (12.5 mg total) by mouth daily. 30 tablet 3    Allergies: No Known Allergies  ROS: As per HPI. A full 14-point review of systems could not be obtained due to the patient's encephalopathy.  PE:  BP 153/85 (BP Location: Right Arm)   Pulse 76   Temp 97.6 F (36.4 C) (Axillary)   Resp  18   SpO2 99%   General: Cachectic-appearing man lying on ED gurney. He is restless and agitated, pulling at the sheets and rails, lifting his head, and throwing his leg over the siderail He is temporarily redirectable. He mumbles at times but this is unintelligible. He does not follow any commands. He will briefly fix and track.  HEENT: Eyes sunken with temporal wasting. Neck supple without LAD. Sclerae anicteric. No conjunctival injection.  CV: Regular, no murmur. Carotid pulses full and symmetric, no bruits. Distal pulses 2+ and symmetric--s/p RLE amputation.  Lungs: Clear with diminished breath sounds throughout on anterior exam.  Abdomen: Thin, soft non-distended, non-tender. Bowel sounds present x4.  Extremities: No C/C/E. S/p RLE amputation Neuro:  CN: Pupils are equal and round. They are symmetrically reactive from 3-->2 mm. He blinks to threat. EOMI intact without nystagmus on obsevation. Corneals present and symmetric. Face is symmetric with normal mobility. Bilateral SCM and trapezii are 5/5. The remainder of his cranial nerves cannot be assessed as he does not participate with the exam.  Motor: Reduced bulk. He resists passive ROM with good strength. He fights blood draws and IV attempts with normal strength. No tremor or other abnormal movements. No drift.  Sensation: He grimaces and withdraws to pain x4. Marland Kitchen  Coordination: No overt dysmetria with spontaneous movements.   Labs:  Lab Results  Component Value Date   WBC 8.7 06/26/2015   HGB 13.9 11/28/2015   HCT 41.0 11/28/2015   PLT 134 (L) 06/26/2015   GLUCOSE 103 (H) 11/28/2015   ALT 27 06/26/2015   AST 32 06/26/2015   NA 142 11/28/2015   K 5.0 11/28/2015   CL 102 11/28/2015   CREATININE 0.90 11/28/2015   BUN 27 (H) 11/28/2015   CO2 27 06/28/2015   TSH 0.709 06/25/2015   INR 1.17 06/25/2015   Troponin-I 0.00 Ammonia 58 ethanol <5  Imaging: I have personally and independently reviewed the [facility] without contrast from  today. This shows significant diffuse generalized atrophy with mild chronic small vessel disease. No acute abnormality.   Assessment and Plan:  1. Acute encephalopathy: His picture is most suggestive of encephalopathy. Acute stroke may cause encephalopathy, particularly acute parietal strokes. However, given the broad differential and uncertainty about the diagnosis, tPA was not given. Other considerations include seizure, intoxication or ingestion, and metabolic dysfunction. He has mildly elevated ammonia. Recommend MRI brain if he is able to tolerate. Recommend EEG. Check thyroid studies. Check UA, urine drug screen. 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 Doubt TB meningoencephalitis, particularly given fulminant onset of symptoms today.  Optimize metabolic status as you are. Minimize the use of opiates, benzos or any medication with strong anticholinergic properties as much as possible. For agitation, recommend low-dose haloperidol or an atypical antipsychotic.  This was discussed with the patient's family at the bedside. They were given the opportunity to ask questions and these were addressed to their satisfaction.   My findings were discussed with the ED attending, Dr. Adela LankFloyd.

## 2015-11-28 NOTE — H&P (Signed)
Date: 11/28/2015               Patient Name:  [name] MRN: [id number]  DOB: 07-Mar-1945 Age / Sex: 70 y.o., male   PCP: No Pcp Per Patient         Medical Service: Internal Medicine Teaching Service         Attending Physician: Dr. Cliffton AstersJohn Campbell, MD    First Contact: Dr. Thomasene LotJames Taylor Pager: [phone]  Second Contact: Dr. Valentino NoseNathan Boswell Pager: [phone]       After Hours (After 5p/  First Contact Pager: [phone]  weekends / holidays): Second Contact Pager: [phone]   Chief Complaint: Confusion   History of Present Illness: [name] is a 70 year old male with PMHx of COPD, Hep C, PVD s/p RLE AKA and marijuana abuse that presents with altered mental status.  Daughter was at the bedside and provided the history as patient was somnolent and uncooperative with exam.  Daughter states that around 4 PM after the family finished eating dinner the grand daughter noticed the patient was bending over trying to pick something up that wasn't there.  He appeared confused and was not able to speak clearly.  The ambulance was called and [facility]. Prior to 4 PM the father was speaking normally, able to perform his ADLs and was in his normal state of health. She also states this has never happened before and there were no unusual events in the day.  He did not fall and has not been around sick contacts.  Earlier that day he made coffee for himself and was grilling a pimento cheese sandwich.  She states that he only takes an aspirin daily and not on any other medications. He currently does not have a primary care physician.     Meds:  Current Meds  Medication Sig  . aspirin EC 81 MG tablet Take 81 mg by mouth daily.    Allergies: Allergies as of 11/28/2015  . (No Known Allergies)   Past Medical History:  Diagnosis Date  . COPD (chronic obstructive pulmonary disease) (HCC)   . Hepatitis C   . PVD (peripheral vascular disease) (HCC)     Family  History: Mother - died at 3486, hx of dementia Aunt - brain aneurysm   Social History: Lives with wife and grand daughter Tabacco abuse:  Currently does not smoke.  Previously 1 pack a day for 50 years Alcohol abuse:  Denies Illicit drug use:  Smokes marijuana for one week out of the month  Review of Systems: A complete ROS was negative except as per HPI.   Physical Exam: Blood pressure 101/90, pulse (!) 57, temperature 97.6 F (36.4 C), temperature source Axillary, resp. rate 14, height 6' (1.829 m), weight 114 lb (51.7 kg), SpO2 98 %. Physical Exam Vitals:   11/28/15 2100 11/28/15 2115 11/28/15 2130 11/28/15 2145  BP: 162/86 168/83 169/90 101/90  Pulse: 65 60 63 (!) 57  Resp: 17 16 15 14   Temp:      TempSrc:      SpO2: 97% 98% 97% 98%  Weight:      Height:       General: Vital signs reviewed.  Patient is cachectic , in no acute distress and cooperative with exam.  Head: Normocephalic and atraumatic.  Eyes: conjunctivae normal, no scleral icterus.  Neck: Supple, trachea midline, normal ROM, no JVD.  Cardiovascular: RRR, S1 normal, S2 normal, no  murmurs, gallops, or rubs. Pulmonary/Chest: Clear to auscultation bilaterally, no wheezes, rales, or rhonchi. Abdominal: Soft, non-tender, non-distended, BS +, no masses, organomegaly, or guarding present.  Musculoskeletal: No joint deformities, erythema, or stiffness, ROM full and nontender. Extremities: No lower extremity edema, Pulses intact.  No cyanosis or clubbing.  Right below knee amputation Neurological: A&O x 1 (to place), unable to complete exam due to somnolence    Skin: Warm, dry and intact. No rashes or erythema. Psychiatric: speech is slurred  BMET    Component Value Date/Time   NA 142 11/28/2015 1707   K 5.0 11/28/2015 1707   CL 102 11/28/2015 1707   CO2 27 11/28/2015 1702   GLUCOSE 103 (H) 11/28/2015 1707   BUN 27 (H) 11/28/2015 1707   CREATININE 0.90 11/28/2015 1707   CALCIUM 9.0 11/28/2015 1702   GFRNONAA >60  11/28/2015 1702   GFRAA >60 11/28/2015 1702   CBC    Component Value Date/Time   WBC 4.8 11/28/2015 1702   RBC 4.21 (L) 11/28/2015 1702   HGB 13.9 11/28/2015 1707   HCT 41.0 11/28/2015 1707   PLT 131 (L) 11/28/2015 1702   MCV 98.1 11/28/2015 1702   MCH 32.1 11/28/2015 1702   MCHC 32.7 11/28/2015 1702   RDW 12.9 11/28/2015 1702   LYMPHSABS 1.8 11/28/2015 1702   MONOABS 0.7 11/28/2015 1702   EOSABS 0.2 11/28/2015 1702   BASOSABS 0.0 11/28/2015 1702    EKG: normal sinus rhythm with no t wave inversions or ST wave elevation  CXR:  FINDINGS: Cardiac shadow is within normal limits. Previously seen lingular infiltrate has resolved in the interval. Chronic changes are noted in the upper lobes bilaterally consistent with prior granulomatous disease.  IMPRESSION: Chronic changes without acute abnormality.  Assessment & Plan by Problem: Active Problems: Encephalopathy (HCC) The patient's altered mental status is acute and has never happened before.  CT of head showed no acute findings.  Will need to rule out stroke with MRI.  Patient does have slurred speech but full neuro exam was not possible due to patient falling asleep throughout the interview.  Patient has a history of Hepatitis C without treatment.  Will obtain an ultrasound of liver to assess for cirrhosis. Mildly elevated ammonium.  UDS was negative except for THC. Patient's daughter states her father does use mariajuana occasionally.  Alcohol level was low.  Family had been around patient and did not report unusual activity or ingestion of any substances.  Patient does appear cachectic.  Weight is stable since admission in April 2017.  Chest X-ray is consistent with prior granulomatous disease.  No malignancy noted.  TSH was obtained and within normal limits.  Patient is currently afebrile with no leukocytosis.  Chest x-ray does not show infectious process.  Awaiting UA results -obtain MRI to rule out stroke - [place] of liver to  assess for hepatic encephalopathy - UA pending  Hepatitis C Daughter states patient has never been treated for Hep C. -Right upper quadrant ultrasound  COPD Diffuse emphysematous changes are noted within both lungs.  Currently Stable.  Not complaining of shortness of breath.  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  He is currently on aspirin on not on any blood thinners. - Aspirin - Atorvastatin 40mg  - Lovenox for DVT prophylaxis       Protein-calorie malnutrition Patient appears cachetic.  May need  to have nutrition consult while inpatient.  Currently NPO.  Will need swallowing evaluation first prior to starting diet   - speech consult  Dispo: Admit patient to Observation with expected length of stay less than 2 midnights.  Signed: Camelia Phenes, DO 11/28/2015, 10:32 PM  Pager: [phone]

## 2015-11-28 NOTE — ED Provider Notes (Signed)
[facility]-EM[facility]ENCY DEPT Provider Note   CSN: [id number] Arrival date & time: 11/28/15  1655     History   Chief Complaint Chief Complaint  Patient presents with  . Code Stroke    HPI [name] is a 70 y.o. male.  Level V caveat AMS.  Patient with a chief complaint of slurred speech and confusion started abruptly about an hour ago. Patient lives in a assisted living facility. He does chronically use drugs. Patient became very silent and not responding to the nursing home is atypical for him. When EMS arrived patient had significant slurred speech. Was made a code stroke en route.   The history is provided by the patient.  Cerebrovascular Accident  This is a new problem. The current episode started 1 to 2 hours ago. The problem occurs constantly. The problem has not changed since onset.Pertinent negatives include no chest pain, no abdominal pain, no headaches and no shortness of breath. Nothing aggravates the symptoms. Nothing relieves the symptoms. He has tried nothing for the symptoms. The treatment provided no relief.    Past Medical History:  Diagnosis Date  . COPD (chronic obstructive pulmonary disease) (HCC)   . Hepatitis C   . PVD (peripheral vascular disease) [facility](HCC)     Patient Active Problem List   Diagnosis Date Noted  . 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 Lingular pneumonia   . Panlobular emphysema (HCC)   . Hepatitis C antibody test positive   . TB (tuberculosis), treated   . Acute systolic heart failure (HCC)   . Pressure ulcer 06/24/2015  . Protein-calorie malnutrition, severe 06/24/2015  . Atrial fibrillation with RVR (HCC) 06/23/2015    Past Surgical History:  Procedure Laterality Date  . ABOVE KNEE LEG AMPUTATION Right   . CARDIAC CATHETERIZATION N/A 06/25/2015   Procedure: Right/Left Heart Cath and Coronary Angiography;  Surgeon: Dolores Pattyaniel R Bensimhon, MD;  [facility];  Service: Cardiovascular;  Laterality: N/A;       Home  Medications    Prior to Admission medications   Medication Sig Start Date End Date Taking? Authorizing Provider  aspirin EC 81 MG tablet Take 81 mg by mouth daily.   Yes Historical Provider, MD  amiodarone (PACERONE) 200 MG tablet Take 1 tablet (200 mg total) by mouth 2 (two) times daily. Patient not taking: Reported on 11/28/2015 06/28/15   Darrick HuntsmanWilliam R Kennedy, MD  apixaban (ELIQUIS) 5 MG TABS tablet Take 1 tablet (5 mg total) by mouth every 12 (twelve) hours. Patient not taking: Reported on 11/28/2015 06/28/15   Darrick HuntsmanWilliam R Kennedy, MD  atorvastatin (LIPITOR) 40 MG tablet Take 1 tablet (40 mg total) by mouth daily at 6 PM. Patient not taking: Reported on 11/28/2015 06/28/15   Darrick HuntsmanWilliam R Kennedy, MD  carvedilol (COREG) 6.25 MG tablet Take 1 tablet (6.25 mg total) by mouth 2 (two) times daily with a meal. Patient not taking: Reported on 11/28/2015 06/28/15   Darrick HuntsmanWilliam R Kennedy, MD  losartan (COZAAR) 25 MG tablet Take 1 tablet (25 mg total) by mouth at bedtime. Patient not taking: Reported on 11/28/2015 06/28/15   Darrick HuntsmanWilliam R Kennedy, MD  spironolactone (ALDACTONE) 25 MG tablet Take 0.5 tablets (12.5 mg total) by mouth daily. Patient not taking: Reported on 11/28/2015 06/28/15   Darrick HuntsmanWilliam R Kennedy, MD    Family History No family history on file.  Social History Social History  Substance Use Topics  . Smoking status: Current Some Day Smoker  . Smokeless tobacco: Never Used  .  Alcohol use No     Allergies   Review of patient's allergies indicates no known allergies.   Review of Systems Review of Systems  Unable to perform ROS: Mental status change  Constitutional: Negative for chills and fever.  HENT: Negative for congestion and facial swelling.   Eyes: Negative for discharge and visual disturbance.  Respiratory: Negative for shortness of breath.   Cardiovascular: Negative for chest pain and palpitations.  Gastrointestinal: Negative for abdominal pain, diarrhea and vomiting.  Musculoskeletal: Negative  for arthralgias and myalgias.  Skin: Negative for color change and rash.  Neurological: Positive for speech difficulty. Negative for tremors, syncope and headaches.  Psychiatric/Behavioral: Negative for confusion and dysphoric mood.     Physical Exam Updated Vital Signs BP 101/90   Pulse (!) 57   Temp 97.6 F (36.4 C) (Axillary)   Resp 14   Ht 6' (1.829 m)   Wt 114 lb (51.7 kg)   SpO2 98%   BMI 15.46 kg/m   Physical Exam  Constitutional: He appears well-developed and well-nourished.  HENT:  Head: Normocephalic and atraumatic.  Eyes: EOM are normal. Pupils are equal, round, and reactive to light.  Neck: Normal range of motion. Neck supple. No JVD present.  Cardiovascular: Normal rate and regular rhythm.  Exam reveals no gallop and no friction rub.   No murmur heard. Pulmonary/Chest: No respiratory distress. He has no wheezes.  Abdominal: He exhibits no distension. There is no rebound and no guarding.  Musculoskeletal: Normal range of motion.  Neurological: He is alert.  Skin: No rash noted. No pallor.  Psychiatric: He has a normal mood and affect. His behavior is normal.  Nursing note and vitals reviewed.    ED Treatments / Results  Labs (all labs ordered are listed, but only abnormal results are displayed) Labs Reviewed  URINE RAPID DRUG SCREEN, HOSP PERFORMED - Abnormal; Notable for the following:       Result Value   Tetrahydrocannabinol POSITIVE (*)    All other components within normal limits  AMMONIA - Abnormal; Notable for the following:    Ammonia 58 (*)    All other components within normal limits  CBC - Abnormal; Notable for the following:    RBC 4.21 (*)    Platelets 131 (*)    All other components within normal limits  COMPREHENSIVE METABOLIC PANEL - Abnormal; Notable for the following:    Glucose, Bld 101 (*)    AST 89 (*)    ALT 81 (*)    All other components within normal limits  I-STAT CHEM 8, ED - Abnormal; Notable for the following:    BUN  27 (*)    Glucose, Bld 103 (*)    Calcium, Ion 1.12 (*)    All other components within normal limits  ETHANOL  URINALYSIS, ROUTINE W REFLEX MICROSCOPIC (NOT AT [facility])  PROTIME-INR  APTT  DIFFERENTIAL  TSH  T4  I-STAT TROPOININ, ED  I-STAT CHEM 8, ED  I-STAT TROPOININ, ED  I-STAT TROPOININ, ED    EKG  EKG Interpretation  Date/Time:  Sunday November 28 2015 17:18:11 EDT Ventricular Rate:  74 PR Interval:    QRS Duration: 97 QT Interval:  434 QTC Calculation: 482 R Axis:   88 Text Interpretation:  Sinus rhythm Borderline right axis deviation Nonspecific T abnormalities, lateral leads Borderline prolonged QT interval No significant change since last tracing Confirmed by Aleigh Grunden MD, DANIEL ([id number]) on 11/28/2015 5:23:52 PM       Radiology Dg Chest Portable  1 View  Result Date: 11/28/2015 CLINICAL DATA:  Altered mental status EXAM: PORTABLE CHEST 1 VIEW COMPARISON:  06/23/2015 FINDINGS: Cardiac shadow is within normal limits. Previously seen lingular infiltrate has resolved in the interval. Chronic changes are noted in the upper lobes bilaterally consistent with prior granulomatous disease. IMPRESSION: Chronic changes without acute abnormality. Electronically Signed   By: Alcide Clever M.D.   On: 11/28/2015 18:37   Ct Head Code Stroke W/o Cm  Result Date: 11/28/2015 CLINICAL DATA:  Code stroke.  Slurred speech and left-sided weakness EXAM: CT HEAD WITHOUT CONTRAST TECHNIQUE: Contiguous axial images were obtained from the base of the skull through the vertex without intravenous contrast. COMPARISON:  None. FINDINGS: Brain: Negative for intracranial hemorrhage. No visible acute infarct. No hydrocephalus or mass. Generalized cerebral volume loss, mild for age. Mild periventricular chronic microvascular disease. Vascular: No hyper dense vessel.  Atherosclerotic calcification. Skull: No acute or aggressive finding. Sinuses/Orbits: Bilateral cataract resection.  No acute finding. Other: Probable  intravenous gas in the left parotid region. No associated swelling or fat stranding. Paged on 11/28/2015 at 5:16 pm to Dr. Roxy Manns, who verbally acknowledged these results. ASPECTS [facility] Stroke Program Early CT Score) - Ganglionic level infarction (caudate, lentiform nuclei, internal capsule, insula, M1-M3 cortex): 7 - Supraganglionic infarction (M4-M6 cortex): 3 Total score (0-10 with 10 being normal): 10 IMPRESSION: 1. No acute finding. 2. ASPECTS is 10. Electronically Signed   By: Marnee Spring M.D.   On: 11/28/2015 17:23    Procedures Procedures (including critical care time)  Medications Ordered in ED Medications  sodium chloride 0.9 % bolus 1,000 mL (1,000 mLs Intravenous New Bag/Given 11/28/15 2030)     Initial Impression / Assessment and Plan / ED Course  I have reviewed the triage vital signs and the nursing notes.  Pertinent labs & imaging results that were available during my care of the patient were reviewed by me and considered in my medical decision making (see chart for details).  Clinical Course    70 yo M With a chief complaint of slurred speech and altered mental status. Patient does use illegal drugs but was made a code stroke on arrival. Patient's airway was evaluated and patent and he was sent to CT.  CT with no acute pathology, neuro does not feel stroke, feel likely encephalopathy.  Obs in ED with no improvement, mild elevation of ammonia, will admit for MRI EEG.   The patients results and plan were reviewed and discussed.   Any x-rays performed were independently reviewed by myself.   Differential diagnosis were considered with the presenting HPI.  Medications  sodium chloride 0.9 % bolus 1,000 mL (1,000 mLs Intravenous New Bag/Given 11/28/15 2030)    Vitals:   11/28/15 2100 11/28/15 2115 11/28/15 2130 11/28/15 2145  BP: 162/86 168/83 169/90 101/90  Pulse: 65 60 63 (!) 57  Resp: 17 16 15 14   Temp:      TempSrc:      SpO2: 97% 98% 97% 98%  Weight:        Height:        Final diagnoses:  Transient alteration of awareness    Admission/ observation were discussed with the admitting physician, patient and/or family and they are comfortable with the plan.    Final Clinical Impressions(s) / ED Diagnoses   Final diagnoses:  Transient alteration of awareness    New Prescriptions New Prescriptions   No medications on file     Melene Plan, DO 11/28/15 2317

## 2015-11-29 ENCOUNTER — Observation Stay (HOSPITAL_COMMUNITY): Payer: Medicare Other

## 2015-11-29 ENCOUNTER — Encounter (HOSPITAL_COMMUNITY): Payer: Self-pay | Admitting: Internal Medicine

## 2015-11-29 DIAGNOSIS — J449 Chronic obstructive pulmonary disease, unspecified: Secondary | ICD-10-CM | POA: Diagnosis not present

## 2015-11-29 DIAGNOSIS — Z89611 Acquired absence of right leg above knee: Secondary | ICD-10-CM | POA: Diagnosis not present

## 2015-11-29 DIAGNOSIS — K7290 Hepatic failure, unspecified without coma: Secondary | ICD-10-CM | POA: Diagnosis not present

## 2015-11-29 DIAGNOSIS — I739 Peripheral vascular disease, unspecified: Secondary | ICD-10-CM

## 2015-11-29 LAB — BASIC METABOLIC PANEL
Anion gap: 6 (ref 5–15)
BUN: 13 mg/dL (ref 6–20)
CALCIUM: 9.1 mg/dL (ref 8.9–10.3)
CO2: 29 mmol/L (ref 22–32)
CREATININE: 0.84 mg/dL (ref 0.61–1.24)
Chloride: 107 mmol/L (ref 101–111)
GFR calc Af Amer: >60 mL/min (ref >60)
Glucose, Bld: 100 mg/dL — ABNORMAL HIGH (ref 65–99)
Potassium: 3.9 mmol/L (ref 3.5–5.1)
SODIUM: 142 mmol/L (ref 135–145)

## 2015-11-29 LAB — CBC
HCT: 42.9 % (ref 39.0–52.0)
Hemoglobin: 13.8 g/dL (ref 13.0–17.0)
MCH: 31.7 pg (ref 26.0–34.0)
MCHC: 32.2 g/dL (ref 30.0–36.0)
MCV: 98.4 fL (ref 78.0–100.0)
PLATELETS: 121 10*3/uL — AB (ref 150–400)
RBC: 4.36 MIL/uL (ref 4.22–5.81)
RDW: 12.8 % (ref 11.5–15.5)
WBC: 4.4 10*3/uL (ref 4.0–10.5)

## 2015-11-29 MED ORDER — DEXTROSE-NACL 5-0.45 % IV SOLN
INTRAVENOUS | Status: AC
  Administered 2015-11-29: 06:00:00 via INTRAVENOUS

## 2015-11-29 MED ORDER — ASPIRIN EC 81 MG PO TBEC
81.0000 mg | DELAYED_RELEASE_TABLET | Freq: Every day | ORAL | Status: DC
  Administered 2015-11-30 – 2015-12-03 (×4): 81 mg via ORAL
  Filled 2015-11-29 (×4): qty 1

## 2015-11-29 MED ORDER — LACTULOSE 10 GM/15ML PO SOLN: 10.0000 g | Freq: Three times a day (TID) | ORAL | Status: DC

## 2015-11-29 MED ORDER — ENOXAPARIN SODIUM 40 MG/0.4ML ~~LOC~~ SOLN
40.0000 mg | Freq: Every day | SUBCUTANEOUS | Status: DC
  Administered 2015-11-29 – 2015-12-03 (×5): 40 mg via SUBCUTANEOUS
  Filled 2015-11-29 (×6): qty 0.4

## 2015-11-29 MED ORDER — LACTULOSE 10 GM/15ML PO SOLN
30.0000 g | Freq: Three times a day (TID) | ORAL | Status: DC
  Administered 2015-11-29 – 2015-11-30 (×5): 30 g via ORAL
  Filled 2015-11-29 (×5): qty 45

## 2015-11-29 MED ORDER — INFLUENZA VAC SPLIT QUAD 0.5 ML IM SUSY
0.5000 mL | PREFILLED_SYRINGE | INTRAMUSCULAR | Status: DC
  Filled 2015-11-29: qty 0.5

## 2015-11-29 MED ORDER — ATORVASTATIN CALCIUM 40 MG PO TABS
40.0000 mg | ORAL_TABLET | Freq: Every day | ORAL | Status: DC
  Administered 2015-11-29 – 2015-12-03 (×5): 40 mg via ORAL
  Filled 2015-11-29 (×5): qty 1

## 2015-11-29 MED ORDER — PNEUMOCOCCAL VAC POLYVALENT 25 MCG/0.5ML IJ INJ
0.5000 mL | INJECTION | INTRAMUSCULAR | Status: DC
  Filled 2015-11-29 (×2): qty 0.5

## 2015-11-29 NOTE — ED Notes (Signed)
Pt.was move to c28

## 2015-11-29 NOTE — ED Notes (Signed)
Unable to complete MRI on [name] to the pt refuses to get his prosthetic leg out for MRI.

## 2015-11-29 NOTE — Progress Notes (Signed)
Pt refusing to remove prosthesis. Pt became angry when I tried to remove it for him. It is unsafe to perform MRI exam with prosthesis.

## 2015-11-29 NOTE — Progress Notes (Signed)
   Subjective: Patient remains altered. At one point during discussion he did become formally oriented 3 but his mental status was waxing and waning and at a later time point during the interview he was no longer oriented to place or time. He denies any pain, nausea or vomiting.  Objective:  Vital signs in last 24 hours: Vitals:   11/29/15 0730 11/29/15 0915 11/29/15 1015 11/29/15 1100  BP: (!) 153/103 (!) 162/102 160/94 158/87  Pulse: 72 68 65 66  Resp: 15 16 16 16   Temp:      TempSrc:      SpO2: 97% 99% 99% 99%  Weight:      Height:       Physical Exam  Constitutional: He appears well-developed.  Extremely frail and cachectic appearing male. Appears older than stated age.  HENT:  Head: Normocephalic and atraumatic.  Poor oral dentition  Cardiovascular: Normal rate and regular rhythm.  Exam reveals no gallop and no friction rub.   No murmur heard. Respiratory: Effort normal and breath sounds normal. No respiratory distress. He has no wheezes.  GI: Soft. Bowel sounds are normal. He exhibits no distension. There is no tenderness.  Neurological: He is alert.  Mental status waxing and waning. At times he was formally oriented and at other times he was not oriented to place or time. Patient with mild asterixis is on examination more prominent on the right     Assessment/Plan: The patient is a 70 year old male with a past medical history of COPD, hep C, peripheral vascular disease status post right lower extremity AKA and marijuana abuse who presents with altered mental status and gradual cognitive decline over the previous year.  1. Altered mental status Patient presents with altered mental status not formally oriented to place or time. Per daughter, patient has had gradual cognitive decline over the previous year with an acute worsening over the last few days. Patient has a history of hep C infection and a right upper quadrant ultrasound demonstrated cirrhotic changes in the liver.  His AST was elevated at 89 and ALT 81. I suspect the patient has cognitive decline that appears acutely worsened secondary to possible hepatic encephalopathy. -- Lactulose 30 mg 3 times a day -- CT head-no acute intracranial abnormality, no evidence of acute infarct. -- MRI brain-no evidence of acute infarct, hydrocephalus or focal mass effect -- Neurology following, appreciate recommendations -- Follow-up TSH -- Urinalysis without evidence of infection -- UDS only positive for THC -- BMP within normal limits  2. COPD Patient has history of COPD. Diffuse emphysematous changes are noted within both lungs. No wheezing on examination today. --Continue to monitor  3. 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 -- Aspirin -- Atorvastatin 40 mg  4. DVT/PE prophylaxis -- Lovenox 40 mg of cutaneous injection once daily  Dispo: Anticipated discharge in approximately 1-2 day(s).   Thomasene Lot, MD 11/29/2015, 1:22 PM Pager: [phone]

## 2015-11-29 NOTE — ED Notes (Signed)
Family at bedside. 

## 2015-11-29 NOTE — ED Notes (Signed)
Paged admitting Doctor.  

## 2015-11-29 NOTE — ED Notes (Signed)
No Diet was ordered for lunch, Patient NPO at this time.

## 2015-11-29 NOTE — ED Notes (Signed)
Patient transported to MRI 

## 2015-11-30 ENCOUNTER — Observation Stay (HOSPITAL_COMMUNITY): Payer: Medicare Other

## 2015-11-30 DIAGNOSIS — I739 Peripheral vascular disease, unspecified: Secondary | ICD-10-CM | POA: Diagnosis not present

## 2015-11-30 DIAGNOSIS — G9340 Encephalopathy, unspecified: Secondary | ICD-10-CM | POA: Diagnosis not present

## 2015-11-30 DIAGNOSIS — K7290 Hepatic failure, unspecified without coma: Secondary | ICD-10-CM | POA: Diagnosis not present

## 2015-11-30 DIAGNOSIS — J449 Chronic obstructive pulmonary disease, unspecified: Secondary | ICD-10-CM | POA: Diagnosis not present

## 2015-11-30 DIAGNOSIS — Z89611 Acquired absence of right leg above knee: Secondary | ICD-10-CM | POA: Diagnosis not present

## 2015-11-30 LAB — COMPREHENSIVE METABOLIC PANEL
ALBUMIN: 3.6 g/dL (ref 3.5–5.0)
ALK PHOS: 67 U/L (ref 38–126)
ALT: 76 U/L — AB (ref 17–63)
AST: 71 U/L — AB (ref 15–41)
Anion gap: 7 (ref 5–15)
BUN: 7 mg/dL (ref 6–20)
CALCIUM: 9.1 mg/dL (ref 8.9–10.3)
CO2: 26 mmol/L (ref 22–32)
CREATININE: 0.69 mg/dL (ref 0.61–1.24)
Chloride: 102 mmol/L (ref 101–111)
GFR calc Af Amer: >60 mL/min (ref >60)
GFR calc non Af Amer: >60 mL/min (ref >60)
GLUCOSE: 116 mg/dL — AB (ref 65–99)
Potassium: 3.3 mmol/L — ABNORMAL LOW (ref 3.5–5.1)
SODIUM: 135 mmol/L (ref 135–145)
Total Bilirubin: 0.7 mg/dL (ref 0.3–1.2)
Total Protein: 7.3 g/dL (ref 6.5–8.1)

## 2015-11-30 LAB — T4: T4 TOTAL: 9.8 ug/dL (ref 4.5–12.0)

## 2015-11-30 MED ORDER — POTASSIUM CHLORIDE CRYS ER 20 MEQ PO TBCR
30.0000 meq | EXTENDED_RELEASE_TABLET | Freq: Once | ORAL | Status: AC
  Administered 2015-11-30: 30 meq via ORAL
  Filled 2015-11-30: qty 1

## 2015-11-30 MED ORDER — LOSARTAN POTASSIUM 50 MG PO TABS
25.0000 mg | ORAL_TABLET | Freq: Every day | ORAL | Status: DC
  Administered 2015-11-30 – 2015-12-03 (×4): 25 mg via ORAL
  Filled 2015-11-30 (×4): qty 1

## 2015-11-30 NOTE — Progress Notes (Signed)
   Subjective: Patient not formally oriented this morning. He appears slightly more altered on my examination today than prior. He is still not had a bowel movement and did not get much sleep last night.  Objective:  Vital signs in last 24 hours: Vitals:   11/29/15 1511 11/29/15 2237 11/30/15 0521 11/30/15 0522  BP: (!) 160/96 (!) 168/104 (!) 148/107 (!) 146/88  Pulse: 78 87 68 74  Resp: 18 20 18    Temp: 97.6 F (36.4 C) 98 F (36.7 C) 98.8 F (37.1 C)   TempSrc: Oral Oral Oral   SpO2: 98% 91% 97% 98%  Weight:      Height:       Physical Exam  Constitutional: He appears well-developed and well-nourished.  Extremely frail, thin and cachectic in appearance  HENT:  Head: Normocephalic and atraumatic.  Cardiovascular: Normal rate and regular rhythm.  Exam reveals no gallop and no friction rub.   No murmur heard. Respiratory: Effort normal and breath sounds normal. No respiratory distress. He has no wheezes.  GI: Soft. He exhibits no distension.  Musculoskeletal: He exhibits no edema.  Status post right lower extremity AKA  Neurological:  Not formally oriented. Patient appears more altered on my examination this morning than prior.     Assessment/Plan: The patient is a 70 year old male with a past medical history of COPD, hep C, peripheral vascular disease status post right lower extremity AKA and marijuana abuse who presents with altered mental status and gradual cognitive decline over the previous year.  In summary, patient appears slightly more altered than previous. He has not had a bowel movement secondary to lactulose.  1. Altered mental status Patient presents with altered mental status not formally oriented to place or time. Per daughter, patient has had gradual cognitive decline over the previous year with an acute worsening over the last few days. Patient has a history of hep C infection and a right upper quadrant ultrasound demonstrated cirrhotic changes in the liver.  His AST was elevated at 71 and ALT 76. I suspect the patient has cognitive decline that appears acutely worsened secondary to possible hepatic encephalopathy. -- Lactulose 30 mg 3 times a day -- CT head-no acute intracranial abnormality, no evidence of acute infarct. -- MRI brain-no evidence of acute infarct, hydrocephalus or focal mass effect -- Neurology following, appreciate recommendations -- Follow-up TSH -- Urinalysis without evidence of infection -- UDS only positive for THC -- BMP within normal limits  2. COPD Patient has history of COPD. Diffuse emphysematous changes are noted within both lungs. No wheezing on examination today. --Continue to monitor  3. 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 -- Aspirin -- Atorvastatin 40 mg  4. DVT/PE prophylaxis -- Lovenox 40 mg of cutaneous injection once daily  Dispo: Anticipated discharge in approximately 1-2 day(s).   Thomasene Lot, MD 11/30/2015, 8:53 AM Pager: [phone]

## 2015-11-30 NOTE — Care Management Note (Signed)
Case Management Note  Patient Details  Name: [name] MRN: [id number] Date of Birth: 25-Nov-1945  Subjective/Objective:              Admitted with acute encephalopathy. States lives in trailer with ex wife and granddaughter. Independent with ADL's. Pt with prothesis for R AKA and  uses cane with ambulation.       Ishmael Holter (Daughter)       [phone]       Action/Plan: Return to home when medically stable. CM to f/u with disposition.  Expected Discharge Date:                  Expected Discharge Plan:  Home/Self Care  In-House Referral:     Discharge planning Services  CM Consult  Post Acute Care Choice:    Choice offered to:     DME Arranged:    DME Agency:     HH Arranged:    HH Agency:     Status of Service:  In process, will continue to follow  If discussed at Long Length of Stay Meetings, dates discussed:    Additional Comments:  Epifanio Lesches, RN 11/30/2015, 1:19 PM

## 2015-11-30 NOTE — Progress Notes (Signed)
EEG completed; results pending.    

## 2015-11-30 NOTE — Procedures (Signed)
HPI:  70 y/o with MS change  TECHNICAL SUMMARY:  A multichannel referential and bipolar montage EEG using the standard international 10-20 system was performed on the patient described as awake and confused.  The dominant background activity consists of 8 hertz activity seen most prominantly over the posterior head region.  6 Hz activity can be seen in all head regions.  The backgound activity is reactive to eye opening and closing procedures.  Low voltage fast (beta) activity is distributed symmetrically and maximally over the anterior head regions.  ACTIVATION:  Stepwise photic stimulation and hyperventilation are not performed  EPILEPTIFORM ACTIVITY:  There were no spikes, sharp waves or paroxysmal activity.  SLEEP:  Physiologic drowsiness is noted, but no stage II sleep.  CARDIAC:  The EKG lead was not well recorded  IMPRESSION:  This is an abnormal EEG demonstrating a mild diffuse slowing of electrocerebral activity.  This can be seen in a wide variety of encephalopathic state including those of a toxic, metabolic, or degenerative nature.  There were no focal, hemispheric, or lateralizing features.  No epileptiform activity was recorded.

## 2015-11-30 NOTE — Progress Notes (Signed)
Patient intake for the shift 600 ml per IV of Dextrose 5 with 0.45 NS continuous.

## 2015-12-01 DIAGNOSIS — Z89611 Acquired absence of right leg above knee: Secondary | ICD-10-CM | POA: Diagnosis not present

## 2015-12-01 DIAGNOSIS — L8990 Pressure ulcer of unspecified site, unspecified stage: Secondary | ICD-10-CM | POA: Insufficient documentation

## 2015-12-01 DIAGNOSIS — F17210 Nicotine dependence, cigarettes, uncomplicated: Secondary | ICD-10-CM | POA: Diagnosis present

## 2015-12-01 DIAGNOSIS — I110 Hypertensive heart disease with heart failure: Secondary | ICD-10-CM | POA: Diagnosis present

## 2015-12-01 DIAGNOSIS — Z23 Encounter for immunization: Secondary | ICD-10-CM | POA: Diagnosis not present

## 2015-12-01 DIAGNOSIS — I739 Peripheral vascular disease, unspecified: Secondary | ICD-10-CM

## 2015-12-01 DIAGNOSIS — I509 Heart failure, unspecified: Secondary | ICD-10-CM | POA: Diagnosis present

## 2015-12-01 DIAGNOSIS — Z681 Body mass index (BMI) 19 or less, adult: Secondary | ICD-10-CM | POA: Diagnosis not present

## 2015-12-01 DIAGNOSIS — K7290 Hepatic failure, unspecified without coma: Secondary | ICD-10-CM | POA: Diagnosis present

## 2015-12-01 DIAGNOSIS — K7460 Unspecified cirrhosis of liver: Secondary | ICD-10-CM | POA: Diagnosis not present

## 2015-12-01 DIAGNOSIS — R4182 Altered mental status, unspecified: Secondary | ICD-10-CM | POA: Diagnosis present

## 2015-12-01 DIAGNOSIS — G9340 Encephalopathy, unspecified: Secondary | ICD-10-CM | POA: Diagnosis not present

## 2015-12-01 DIAGNOSIS — J449 Chronic obstructive pulmonary disease, unspecified: Secondary | ICD-10-CM

## 2015-12-01 DIAGNOSIS — K219 Gastro-esophageal reflux disease without esophagitis: Secondary | ICD-10-CM | POA: Diagnosis present

## 2015-12-01 DIAGNOSIS — Z7901 Long term (current) use of anticoagulants: Secondary | ICD-10-CM | POA: Diagnosis not present

## 2015-12-01 DIAGNOSIS — B182 Chronic viral hepatitis C: Secondary | ICD-10-CM | POA: Diagnosis present

## 2015-12-01 DIAGNOSIS — E43 Unspecified severe protein-calorie malnutrition: Secondary | ICD-10-CM | POA: Diagnosis present

## 2015-12-01 DIAGNOSIS — R64 Cachexia: Secondary | ICD-10-CM | POA: Diagnosis present

## 2015-12-01 DIAGNOSIS — I480 Paroxysmal atrial fibrillation: Secondary | ICD-10-CM | POA: Diagnosis present

## 2015-12-01 DIAGNOSIS — Z7982 Long term (current) use of aspirin: Secondary | ICD-10-CM | POA: Diagnosis not present

## 2015-12-01 LAB — COMPREHENSIVE METABOLIC PANEL
ALT: 80 U/L — AB (ref 17–63)
ANION GAP: 11 (ref 5–15)
AST: 82 U/L — ABNORMAL HIGH (ref 15–41)
Albumin: 3.4 g/dL — ABNORMAL LOW (ref 3.5–5.0)
Alkaline Phosphatase: 64 U/L (ref 38–126)
BUN: 9 mg/dL (ref 6–20)
CHLORIDE: 99 mmol/L — AB (ref 101–111)
CO2: 27 mmol/L (ref 22–32)
CREATININE: 0.77 mg/dL (ref 0.61–1.24)
Calcium: 9.1 mg/dL (ref 8.9–10.3)
GFR calc non Af Amer: >60 mL/min (ref >60)
Glucose, Bld: 94 mg/dL (ref 65–99)
Potassium: 3.3 mmol/L — ABNORMAL LOW (ref 3.5–5.1)
SODIUM: 137 mmol/L (ref 135–145)
Total Bilirubin: 1.4 mg/dL — ABNORMAL HIGH (ref 0.3–1.2)
Total Protein: 7.1 g/dL (ref 6.5–8.1)

## 2015-12-01 LAB — VITAMIN B12: VITAMIN B 12: 594 pg/mL (ref 180–914)

## 2015-12-01 LAB — AMMONIA: Ammonia: 20 umol/L (ref 9–35)

## 2015-12-01 MED ORDER — LACTULOSE 10 GM/15ML PO SOLN
30.0000 g | ORAL | Status: DC | PRN
  Administered 2015-12-01: 30 g via ORAL
  Filled 2015-12-01: qty 45

## 2015-12-01 MED ORDER — LACTULOSE 10 GM/15ML PO SOLN
30.0000 g | Freq: Four times a day (QID) | ORAL | Status: DC
  Administered 2015-12-01 – 2015-12-03 (×6): 30 g via ORAL
  Filled 2015-12-01 (×9): qty 45

## 2015-12-01 MED ORDER — BOOST / RESOURCE BREEZE PO LIQD
1.0000 | Freq: Three times a day (TID) | ORAL | Status: DC
  Administered 2015-12-01 – 2015-12-03 (×4): 1 via ORAL

## 2015-12-01 MED ORDER — ADULT MULTIVITAMIN W/MINERALS CH
1.0000 | ORAL_TABLET | Freq: Every day | ORAL | Status: DC
  Administered 2015-12-01 – 2015-12-03 (×4): 1 via ORAL
  Filled 2015-12-01 (×3): qty 1

## 2015-12-01 MED ORDER — POTASSIUM CHLORIDE CRYS ER 20 MEQ PO TBCR
30.0000 meq | EXTENDED_RELEASE_TABLET | Freq: Once | ORAL | Status: AC
  Administered 2015-12-01: 30 meq via ORAL
  Filled 2015-12-01: qty 1

## 2015-12-01 NOTE — Progress Notes (Signed)
Subjective: She remains encephalopathic and confused. Per daughter he has times of lucidity and his mental status waxes and wanes.  Exam: Vitals:   11/30/15 2125 12/01/15 0623  BP: 113/66 129/74  Pulse: 95 70  Resp: 18 18  Temp: 98.9 F (37.2 C) 97.8 F (36.6 C)       Gen: In bed, NAD, Cachectic-appearing, Eyes sunken with temporal wasting MS: Patient is alert but remains confused at time place and date. CN: Pupils are equal and round. They are symmetrically reactive from 3-->2 mm. He blinks to threat. EOMI intact without nystagmus on obsevation. Corneals present and symmetric. Face is symmetric with normal mobility. Bilateral SCM and trapezii are 5/5. The remainder of his cranial nerves cannot be assessed as he does not participate with the exam.  Motor: Reduced bulk. He resists passive ROM with good strength.  Sensory: Intact throughout   Pertinent Labs/Diagnostics: TSH 1.65 B1 pending Potassium 3.3 Chloride 99 Albumin 3.4 AST 82 ALTs 80 Ammonia pending  EEG demonstrates a mild diffuse slowing which can be seen in the variety of encephalopathic states no epileptiform activity was noted    Impression: Most likely acute encephalopathy. Patient does have a history of significant drinking. It is unclear at how much she was drinking prior to being hospitalized and when his last drink was. There is concern that this could be worn acute encephalopathy secondary to malnutrition and possibility of sudden onset cessation of drinking. Daughter was not in the room to be asked these questions. For this reason we will obtain a thiamine level. If this is low would recommend starting thiamine 500 mg 3 times a day for 3 days and then dropped to 100 mg daily. At this time neurology will sign off as all ancillary tests have been negative.  Felicie Morn PA-C [facility] [phone]    12/01/2015, 9:48 AM

## 2015-12-01 NOTE — Progress Notes (Signed)
Initial Nutrition Assessment  DOCUMENTATION CODES:   Underweight, Severe malnutrition in context of chronic illness  INTERVENTION:   -Boost Breeze po TID, each supplement provides 250 kcal and 9 grams of protein -Downgrade diet to dysphagia 2 for ease of intake -MVI daily  NUTRITION DIAGNOSIS:   Malnutrition related to chronic illness as evidenced by severe depletion of muscle mass, severe depletion of body fat.  GOAL:   Patient will meet greater than or equal to 90% of their needs  MONITOR:   PO intake, Supplement acceptance, Labs, Weight trends, Skin, I & O's  REASON FOR ASSESSMENT:   Other (Comment)    ASSESSMENT:   [name] is a 70 year old male with PMHx of COPD, Hep C, PVD s/p RLE AKA and marijuana abuse that presents with altered mental status.  Daughter was at the bedside and provided the history as patient was somnolent and uncooperative with exam.  Daughter states that around 4 PM after the family finished eating dinner the grand daughter noticed the patient was bending over trying to pick something up that wasn't there.  He appeared confused and was not able to speak clearly.  The ambulance was called and [facility]. Prior to 4 PM the father was speaking normally, able to perform his ADLs and was in his normal state of health. She also states this has never happened before and there were no unusual events in the day.  He did not fall and has not been around sick contacts.  Earlier that day he made coffee for himself and was grilling a pimento cheese sandwich.  She states that he only takes an aspirin daily and not on any other medications. He currently does not have a primary care physician.  RD drawn to chart due to underweight status.   Pt admitted with encephalopathy.   Pt confused at time of visit and unable to provide hx. Pt daughter at bedside provided most of hx. She reveals that pt has experienced a poor appetite over  the past 3-4 days. Prior to this pt typically ate well (4 meals per day). Frequently consumed foods included moist meats in crock pot (pt with minimal teeth), sodas, and juices. Pt has eaten very little off meal trays (PO: 5-50%, per doc flowsheets), however, pt has been consuming mainly water, juice, milk, and tea. Pt daughter reveals pt has tried Ensure in the past, but will not drink it.   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 Pt weighs self daily and UBW is around 110#.   Nutrition-Focused physical exam completed. Findings are severe fat depletion, severe muscle depletion, and no edema.   Pt daughter expressed concern over pt's nutritional status. Discussed ways pt could incorporate additional calories and protein into diet throughout the day by eating small, frequent meals and snacks (ex puddings, ice cream, yoguty, cheese, etc) and trialing other nutritional supplements (provided Boost Breeze for pt to try). Also encouraged mechanically altered foods and meats for ease of oral intake; pt daughter reveals she prepares most meats in a crock pot. Pt refused Boost Breeze supplement, but provided encouragement to consume. Pt daughter reports she will attempt to get him to try it. Also discussed recommended OTC MVI that pt could use at discharge.   Labs reviewed: K: 3.3.   Diet Order:  Diet regular Room service appropriate? Yes; Fluid consistency: Thin  Skin:  Reviewed, no issues  Last  BM:  12/01/15  Height:   Ht Readings from Last 1 Encounters:  11/28/15 6' (1.829 m)    Weight:   Wt Readings from Last 1 Encounters:  11/28/15 114 lb (51.7 kg)    Ideal Body Weight:  74.4 kg (adjusted for rt AKA)  BMI:  Body mass index is 15.46 kg/m.  Adjusted BMI (for rt AKA): 16.8   Estimated Nutritional Needs:   Kcal:  1600-1800  Protein:  80-95 grams  Fluid:  1.6-1.8 L  EDUCATION NEEDS:   Education needs addressed  [name] A. Mayford Knife, RD, LDN,  CDE Pager: [phone] After hours Pager: [phone]

## 2015-12-01 NOTE — Progress Notes (Signed)
Date of Admission:  11/28/2015     Active Problems:   Hepatic encephalopathy (HCC)   . aspirin EC  81 mg Oral Daily  . atorvastatin  40 mg Oral q1800  . enoxaparin (LOVENOX) injection  40 mg Subcutaneous Daily  . Influenza vac split quadrivalent PF  0.5 mL Intramuscular Tomorrow-1000  . lactulose  30 g Oral QID  . losartan  25 mg Oral Daily  . pneumococcal 23 valent vaccine  0.5 mL Intramuscular Tomorrow-1000   Review of Systems: Review of Systems  Unable to perform ROS: Mental acuity    Past Medical History:  Diagnosis Date  . Anxiety   . Arthritis    "hands" (11/29/2015)  . Cirrhosis of liver (HCC)   . COPD (chronic obstructive pulmonary disease) (HCC)   . GERD (gastroesophageal reflux disease)   . Headache    "weekly, if that" (11/29/2015)  . 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 Hepatitis C   . High cholesterol   . History of blood transfusion 2000  . Hypertension   . Kidney stones    "alot"  . Pneumonia 05/2015  . PVD (peripheral vascular disease) (HCC)   . Tuberculosis 2007    Social History  Substance Use Topics  . Smoking status: Current Some Day Smoker    Years: 50.00    Types: Cigarettes  . Smokeless tobacco: Never Used  . Alcohol use Yes     Comment: 11/29/2015 "used to drink; quit ~ 2000"    History reviewed. No pertinent family history. No Known Allergies  OBJECTIVE: Vitals:   11/30/15 0522 11/30/15 1502 11/30/15 2125 12/01/15 0623  BP: (!) 146/88 (!) 155/92 113/66 129/74  Pulse: 74 94 95 70  Resp:  18 18 18   Temp:  98.2 F (36.8 C) 98.9 F (37.2 C) 97.8 F (36.6 C)  TempSrc:  Oral    SpO2: 98% 97% 97% 98%  Weight:      Height:       Body mass index is 15.46 kg/m.  Physical Exam  Constitutional:  He is alert and sitting up in bed. His daughter is at the bedside.  Pulmonary/Chest: Effort normal.  Neurological: He is alert.  He remains disoriented. His daughter states that he has been talking to his grandchildren who have  not been present. He has fine tremor in both hands but no asterixis.  Skin: No rash noted.    Lab Results Lab Results  Component Value Date   WBC 4.4 11/29/2015   HGB 13.8 11/29/2015   HCT 42.9 11/29/2015   MCV 98.4 11/29/2015   PLT 121 (L) 11/29/2015    Lab Results  Component Value Date   CREATININE 0.77 12/01/2015   BUN 9 12/01/2015   NA 137 12/01/2015   K 3.3 (L) 12/01/2015   CL 99 (L) 12/01/2015   CO2 27 12/01/2015    Lab Results  Component Value Date   ALT 80 (H) 12/01/2015   AST 82 (H) 12/01/2015   ALKPHOS 64 12/01/2015   BILITOT 1.4 (H) 12/01/2015     Microbiology: No results found for this or any previous visit (from the past 240 hour(s)).   ASSESSMENT: He remains encephalopathic. Acute decompensation of hepatic encephalopathy remains our #1 concern. He has not responded to lactulose yet. We will titrate the dose up so that he is having 3-4 soft bowel movements daily. His daughter has told us that he no longer drinks alcohol. I do not think this is alcohol withdrawal.  PLAN: 1.  Increase lactulose  Cliffton AstersJohn Luara Faye, MD [facility] [phone] pager   [phone] cell

## 2015-12-01 NOTE — Progress Notes (Signed)
Subjective: No acute events overnight. Patient continues to be altered and experiencing hallucinations. He had 1 bowel movement.  Objective:  Vital signs in last 24 hours: Vitals:   11/30/15 0522 11/30/15 1502 11/30/15 2125 12/01/15 0623  BP: (!) 146/88 (!) 155/92 113/66 129/74  Pulse: 74 94 95 70  Resp:  18 18 18   Temp:  98.2 F (36.8 C) 98.9 F (37.2 C) 97.8 F (36.6 C)  TempSrc:  Oral    SpO2: 98% 97% 97% 98%  Weight:      Height:       Physical Exam  Constitutional: He is oriented to person, place, and time. He appears well-developed.  Appears older than stated age, extremely frail and cachectic appearing  HENT:  Head: Normocephalic and atraumatic.  Cardiovascular: Normal rate and regular rhythm.  Exam reveals no gallop and no friction rub.   No murmur heard. Respiratory: Effort normal and breath sounds normal. No respiratory distress. He has no wheezes.  GI: Soft. Bowel sounds are normal. He exhibits no distension. There is no tenderness.  Musculoskeletal: He exhibits no edema.  Neurological: He is alert and oriented to person, place, and time.     Assessment/Plan: The patient is a 70 year old male with a past medical history of COPD, hep C, peripheral vascular disease status post right lower extremity AKA and marijuana abuse who presents with altered mental status and gradual cognitive decline over the previous year.  In summary, patient remains altered and continues to have hallucinations. The etiology of this patient's altered mental status remains unclear. Imaging evaluation has not revealed evidence of infarct, mass lesion or other pathology. I suspect there could be a component of hepatic encephalopathy secondary to HCV-induced hepatic cirrhosis. The patient has had a recent decline in his cognition and memory over the past year and it is unclear if this is a further continuation of that decline or a more acute pathology. We will continue to treat the patient with  lactulose 30 mg and titrate until the patient has 3-4 bowel movements per day.  1. Altered mental status -- CT head-no acute intracranial abnormality, no evidence of acute infarct. -- MRI brain-no evidence of acute infarct, hydrocephalus or focal mass effect -- Neurology following, appreciate recommendations -- TSH normal -- Urinalysis without evidence of infection -- UDS only positive for THC -- Continue lactulose 30 mg a minimum of 4 times daily or until 3-4 bowel movements are achieved  2. Paroxysmal atrial fibrillation with rapid ventricular response The patient had a recent hospitalization secondary to atrial fibrillation with rapid ventricular response. At that time he was discharged onApaxaban and amiodarone with plans to follow-up. At the time of presentation the patient was not on these medications and it is unclear why he has not been taking them. During his hospitalization he is noted be in normal sinus rhythm without evidence of atrial fibrillation. If the patient becomes more oriented we will discuss with him the risks and benefits of starting treatment for atrial fibrillation. However, if the patient remains altered we will have this discussion with his daughter to see if she would prefer restarting these medications.   3. COPD Patient has history of COPD. Diffuse emphysematous changes are noted within both lungs. No wheezing on examination today. --Continue to monitor  4. 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 -- Aspirin -- Atorvastatin 40 mg  5. DVT/PE prophylaxis -- Lovenox 40 mg of cutaneous injection once  daily  Dispo: Anticipated discharge in approximately 1-2 day(s).   Thomasene LotJames Evadene Wardrip, MD 12/01/2015, 11:51 AM Pager: [phone]

## 2015-12-02 DIAGNOSIS — K7460 Unspecified cirrhosis of liver: Secondary | ICD-10-CM | POA: Diagnosis present

## 2015-12-02 LAB — VITAMIN B1: Vitamin B1 (Thiamine): 107.5 nmol/L (ref 66.5–200.0)

## 2015-12-02 NOTE — Progress Notes (Signed)
Patient was much improved this afternoon. He was AAO x3. He was interactive and fluid with conversation and understanding. He did have good sleep last night/this morning which may have helped with his delirium.

## 2015-12-02 NOTE — Progress Notes (Signed)
Date of Admission:  11/28/2015   T  Principal Problem:   Encephalopathy Active Problems:   Hepatitis C antibody test positive   Hepatic cirrhosis (HCC)   Atrial fibrillation with RVR (HCC)   Protein-calorie malnutrition, severe   Pressure injury of skin   COPD (chronic obstructive pulmonary disease) (HCC)   PAD (peripheral artery disease) (HCC)   . aspirin EC  81 mg Oral Daily  . atorvastatin  40 mg Oral q1800  . enoxaparin (LOVENOX) injection  40 mg Subcutaneous Daily  . feeding supplement  1 Container Oral TID BM  . Influenza vac split quadrivalent PF  0.5 mL Intramuscular Tomorrow-1000  . lactulose  30 g Oral QID  . losartan  25 mg Oral Daily  . multivitamin with minerals  1 tablet Oral Daily  . pneumococcal 23 valent vaccine  0.5 mL Intramuscular Tomorrow-1000   Review of Systems: Review of Systems  Unable to perform ROS: Medical condition    Past Medical History:  Diagnosis Date  . Anxiety   . Arthritis    "hands" (11/29/2015)  . Cirrhosis of liver (HCC)   . COPD (chronic obstructive pulmonary disease) (HCC)   . GERD (gastroesophageal reflux disease)   . Headache    "weekly, if that" (11/29/2015)  . 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 Hepatitis C   . High cholesterol   . History of blood transfusion 2000  . Hypertension   . Kidney stones    "alot"  . Pneumonia 05/2015  . PVD (peripheral vascular disease) (HCC)   . Tuberculosis 2007    Social History  Substance Use Topics  . Smoking status: Current Some Day Smoker    Years: 50.00    Types: Cigarettes  . Smokeless tobacco: Never Used  . Alcohol use Yes     Comment: 11/29/2015 "used to drink; quit ~ 2000"    History reviewed. No pertinent family history. No Known Allergies  OBJECTIVE: Vitals:   12/01/15 2123 12/01/15 2213 12/01/15 2227 12/02/15 0447  BP:  132/73 120/80 122/78  Pulse:   77 73  Resp:  16 18 16   Temp: 98.1 F (36.7 C) (!) 100.6 F (38.1 C) 98.9 F (37.2 C) 98.5 F (36.9  C)  TempSrc:  Oral Oral Oral  SpO2:  97% 99% 100%  Weight:      Height:       Body mass index is 15.46 kg/m.  Physical Exam  Constitutional:  He is sound asleep and difficult to arouse. A full exam could not be completed. His daughter is not present at this time.    Lab Results Lab Results  Component Value Date   WBC 4.4 11/29/2015   HGB 13.8 11/29/2015   HCT 42.9 11/29/2015   MCV 98.4 11/29/2015   PLT 121 (L) 11/29/2015    Lab Results  Component Value Date   CREATININE 0.77 12/01/2015   BUN 9 12/01/2015   NA 137 12/01/2015   K 3.3 (L) 12/01/2015   CL 99 (L) 12/01/2015   CO2 27 12/01/2015    Lab Results  Component Value Date   ALT 80 (H) 12/01/2015   AST 82 (H) 12/01/2015   ALKPHOS 64 12/01/2015   BILITOT 1.4 (H) 12/01/2015     Microbiology: No results found for this or any previous visit (from the past 240 hour(s)).   ASSESSMENT: He remains encephalopathic. It is not an entirely clear why he had an acute decompensation 5 days ago. He appears to have cirrhosis, most likely  on the basis of chronic hepatitis C, but it is not absolutely certain that this is hepatic encephalopathy. He may have early dementia now complicated by some sleep deprivation and sundowning. He refused lactulose last night. We will try to reexamine him later today when his daughter is present. He has had some low-grade fever but no other overt signs of infection.  PLAN: 1. Continue lactulose as possible  Cliffton Asters, MD [facility] [phone] pager   [phone] cell 12/02/2015, 11:35 AM

## 2015-12-02 NOTE — Progress Notes (Signed)
Subjective: No acute events overnight. Patient remains disoriented this morning. He denied pain. He was not formally oriented except for to self this morning.  Objective:  Vital signs in last 24 hours: Vitals:   12/01/15 2123 12/01/15 2213 12/01/15 2227 12/02/15 0447  BP:  132/73 120/80 122/78  Pulse:   77 73  Resp:  16 18 16   Temp: 98.1 F (36.7 C) (!) 100.6 F (38.1 C) 98.9 F (37.2 C) 98.5 F (36.9 C)  TempSrc:  Oral Oral Oral  SpO2:  97% 99% 100%  Weight:      Height:       Physical Exam  Constitutional: He appears well-developed.  Extremely frail and cachectic appearing  HENT:  Head: Normocephalic and atraumatic.  Cardiovascular: Normal rate and regular rhythm.  Exam reveals no gallop and no friction rub.   No murmur heard. Respiratory: Effort normal and breath sounds normal. No respiratory distress. He has no wheezes.  GI: Soft.  Neurological:  Patient only oriented to self. Was unable to answer where he was or why he was in the hospital. His mental status is extremely fluctuating in nature and appears consistent with delirium.     Assessment/Plan: The patient is a 70 year old male with a past medical history of COPD, hep C, peripheral vascular disease status post right lower extremity AKA and marijuana abuse who presents with altered mental status and gradual cognitive decline over the previous year.  Patient remains encephalopathic. Currently, acute decompensated hepatic encephalopathy remains our primary diagnosis for the patient's altered mental status. His daughter stated that he did seem improved yesterday. We will continue to treat with lactulose and monitor for clinical improvement. Patient had one temperature of 100.6 overnight. He has had no other recordings that have been febrile. He does not appear infected at this time. We will continue to monitor his fever curve and if the patient becomes febrile again we'll consider workup for potential infectious  etiology.  1. Altered mental status -- CT head-no acute intracranial abnormality, no evidence of acute infarct. -- MRI brain-no evidence of acute infarct, hydrocephalus or focal mass effect -- Neurology following, appreciate recommendations -- TSH normal -- Urinalysis without evidence of infection -- UDS only positive for THC -- Continue lactulose 30 mg a minimum of 4 times daily or until 3-4 bowel movements are achieved  2. Paroxysmal atrial fibrillation with rapid ventricular response The patient had a recent hospitalization secondary to atrial fibrillation with rapid ventricular response. At that time he was discharged onApaxaban and amiodarone with plans to follow-up. At the time of presentation the patient was not on these medications and it is unclear why he has not been taking them. During his hospitalization he is noted be in normal sinus rhythm without evidence of atrial fibrillation. If the patient becomes more oriented we will discuss with him the risks and benefits of starting treatment for atrial fibrillation. However, if the patient remains altered we will have this discussion with his daughter to see if she would prefer restarting these medications.   3. COPD Patient has history of COPD. Diffuse emphysematous changes are noted within both lungs. No wheezing on examination today. --Continue to monitor  4. 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 -- Aspirin -- Atorvastatin 40 mg  5. DVT/PE prophylaxis -- Lovenox 40 mg of cutaneous injection once daily  Dispo: Anticipated discharge in approximately 1-2 day(s).   Thomasene Lot, MD 12/02/2015, 10:13 AM Pager:  [phone]  

## 2015-12-03 MED ORDER — PNEUMOCOCCAL VAC POLYVALENT 25 MCG/0.5ML IJ INJ
0.5000 mL | INJECTION | INTRAMUSCULAR | Status: AC
  Administered 2015-12-03: 0.5 mL via INTRAMUSCULAR
  Filled 2015-12-03: qty 0.5

## 2015-12-03 MED ORDER — BOOST / RESOURCE BREEZE PO LIQD: 1.0000 | Freq: Three times a day (TID) | ORAL | 0 refills | Status: DC

## 2015-12-03 MED ORDER — LACTULOSE 10 GM/15ML PO SOLN: 30.0000 g | Freq: Three times a day (TID) | ORAL | 0 refills | Status: DC

## 2015-12-03 NOTE — Progress Notes (Signed)
Subjective: No acute events overnight. Patient was alert and oriented 3. His family states that he has returned to his baseline. He is eating well and has no additional complaints this morning. He'll ambulate this afternoon and will be ready for discharge.  Objective:  Vital signs in last 24 hours: Vitals:   12/02/15 0447 12/02/15 1438 12/03/15 0539 12/03/15 0555  BP: 122/78 109/75 115/61   Pulse: 73 72 69   Resp: 16 18 18    Temp: 98.5 F (36.9 C) 97.8 F (36.6 C) 99.5 F (37.5 C) 98.9 F (37.2 C)  TempSrc: Oral  Oral Oral  SpO2: 100% 98% 98%   Weight:      Height:       Physical Exam  Constitutional: He is oriented to person, place, and time. He appears well-developed and well-nourished.  Appears older than stated age, frail, cachectic  HENT:  Head: Normocephalic and atraumatic.  Cardiovascular: Normal rate and regular rhythm.  Exam reveals no gallop and no friction rub.   No murmur heard. Respiratory: Effort normal and breath sounds normal. No respiratory distress. He has no wheezes.  GI: Soft. Bowel sounds are normal. He exhibits no distension. There is no tenderness.  Musculoskeletal: He exhibits no edema.  Right below-the-knee amputation  Neurological: He is alert and oriented to person, place, and time.     Assessment/Plan: The patient is a [age] year old male with a past medical history of COPD, hep C, peripheral vascular disease status post right lower extremity AKA and marijuana abuse who presents with altered mental status and gradual cognitive decline over the previous year.  The patient's encephalopathy has improved. He was formally alert and oriented 3. The patient's family was in the room and states that he is at his baseline. I think the most likely etiology for the patient's altered mental status was secondary to acute decompensation of hepatic encephalopathy. This was further compounded by hospital delirium and sundowning. He improved with lactulose therapy  and reorientation. He'll be discharged and continued on lactulose 3 times daily to achieve 2-3 bowel movements per day.  1. Altered mental status, resolved -- CT head-no acute intracranial abnormality, no evidence of acute infarct. -- MRI brain-no evidence of acute infarct, hydrocephalus or focal mass effect -- Neurology following, appreciate recommendations -- TSH normal -- Urinalysis without evidence of infection -- UDS only positive for THC -- Continue lactulose 30 mg a minimum of 4 times daily or until 3-4 bowel movements are achieved  2. Paroxysmal atrial fibrillation with rapid ventricular response The patient had a recent hospitalization secondary to atrial fibrillation with rapid ventricular response. At that time he wasdischarged onApaxaban andamiodarone with plans to follow-up. At the time of presentation the patient was not on these medications and it is unclear why he has not been taking them. During his hospitalization he is noted to be in normal sinus rhythm without evidence of atrial fibrillation.   The patient expressed that these medications were too costly for him to afford and that he was not taking them at this time. The patient and his daughter stated they will have follow[facility] with [facility] and discuss medication options at that time. We discussed with the patient the risk of atrial fibrillation and he endorsed understanding. They will consider the possibility of restarting these medications in the outpatient setting.   3. COPD Patient has history of COPD. Diffuse emphysematous changes are noted within both lungs. No wheezing on examination today. --Continue to monitor  4. Peripheral vascular  disease Patient has severe peripheral vascular disease and is status post AKA on the right. -- Aspirin -- Atorvastatin 40 mg  5. DVT/PE prophylaxis -- Lovenox 40 mg of cutaneous injection once daily  Dispo: Anticipated discharge this afternoon.   Thomasene Lot,  MD 12/03/2015, 12:23 PM Pager: [phone]

## 2015-12-03 NOTE — Progress Notes (Signed)
Date of Admission:  11/28/2015     Principal Problem:   Encephalopathy Active Problems:   Hepatitis C antibody test positive   Hepatic cirrhosis (HCC)   Atrial fibrillation with RVR (HCC)   Protein-calorie malnutrition, severe   Pressure injury of skin   COPD (chronic obstructive pulmonary disease) (HCC)   PAD (peripheral artery disease) (HCC)   . aspirin EC  81 mg Oral Daily  . atorvastatin  40 mg Oral q1800  . enoxaparin (LOVENOX) injection  40 mg Subcutaneous Daily  . feeding supplement  1 Container Oral TID BM  . Influenza vac split quadrivalent PF  0.5 mL Intramuscular Tomorrow-1000  . lactulose  30 g Oral QID  . losartan  25 mg Oral Daily  . multivitamin with minerals  1 tablet Oral Daily  . pneumococcal 23 valent vaccine  0.5 mL Intramuscular Tomorrow-1000    SUBJECTIVE: He slept for 13 hours yesterday and is feeling much better today. He states that he is no longer "seeing things".  Review of Systems: Review of Systems  Constitutional: Positive for malaise/fatigue. Negative for chills, diaphoresis, fever and weight loss.  Respiratory: Negative for cough, sputum production and shortness of breath.   Cardiovascular: Negative for chest pain.  Gastrointestinal: Negative for abdominal pain, diarrhea, nausea and vomiting.  Skin: Negative for rash.  Neurological: Negative for dizziness, speech change and headaches.    Past Medical History:  Diagnosis Date  . Anxiety   . Arthritis    "hands" (11/29/2015)  . Cirrhosis of liver (HCC)   . COPD (chronic obstructive pulmonary disease) (HCC)   . GERD (gastroesophageal reflux disease)   . Headache    "weekly, if that" (11/29/2015)  . 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 Hepatitis C   . High cholesterol   . History of blood transfusion 2000  . Hypertension   . Kidney stones    "alot"  . Pneumonia 05/2015  . PVD (peripheral vascular disease) (HCC)   . Tuberculosis 2007    Social History  Substance Use  Topics  . Smoking status: Current Some Day Smoker    Years: 50.00    Types: Cigarettes  . Smokeless tobacco: Never Used  . Alcohol use Yes     Comment: 11/29/2015 "used to drink; quit ~ 2000"    History reviewed. No pertinent family history. No Known Allergies  OBJECTIVE: Vitals:   12/02/15 0447 12/02/15 1438 12/03/15 0539 12/03/15 0555  BP: 122/78 109/75 115/61   Pulse: 73 72 69   Resp: 16 18 18    Temp: 98.5 F (36.9 C) 97.8 F (36.6 C) 99.5 F (37.5 C) 98.9 F (37.2 C)  TempSrc: Oral  Oral Oral  SpO2: 100% 98% 98%   Weight:      Height:       Body mass index is 15.46 kg/m.  Physical Exam  Constitutional: He is oriented to person, place, and time.  He is alert, sitting up in bed talking with his family.  Neurological: He is alert and oriented to person, place, and time.  Skin: No rash noted.  Psychiatric: Mood and affect normal.    Lab Results Lab Results  Component Value Date   WBC 4.4 11/29/2015   HGB 13.8 11/29/2015   HCT 42.9 11/29/2015   MCV 98.4 11/29/2015   PLT 121 (L) 11/29/2015    Lab Results  Component Value Date   CREATININE 0.77 12/01/2015   BUN 9 12/01/2015   NA 137 12/01/2015   K 3.3 (L) 12/01/2015  CL 99 (L) 12/01/2015   CO2 27 12/01/2015    Lab Results  Component Value Date   ALT 80 (H) 12/01/2015   AST 82 (H) 12/01/2015   ALKPHOS 64 12/01/2015   BILITOT 1.4 (H) 12/01/2015     Microbiology: No results found for this or any previous visit (from the past 240 hour(s)).   ASSESSMENT: His encephalopathy has resolved. We are still not entirely certain of the cause but I suspect that it is multifactorial with components of early dementia, hepatic encephalopathy and transient sleep deprivation. He has improved after getting better sleep and starting lactulose. He has family agree that he is ready for discharge.  PLAN: 1. Discharged home on lactulose titrated to 3 bowel movements daily 2. 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

## 2015-12-03 NOTE — Discharge Instructions (Signed)
[name],   I think that you confusion may be in part due to what we call hepatic encephalopathy where your liver is unable to process various toxins including ammonia. This can build up in your system and affect your brain causing confusion. To prevent this from happening, I am giving you a new medication called Lactulose. This will help you have bowel movements that will get rid of the ammonia. I would like you have 3 good bowel movements every day. If you are having fewer than 3 then you can increase the dose of the Lactulose and decrease the dose if you are having more than 3 bowel movements a day.   I was unable to contact the [facility] to schedule you a follow up appointment. Their number is [phone]. Extension [phone]. They will need your full name, last 4 of your social security number and a good contact number.   Hepatic Encephalopathy Hepatic encephalopathy is a loss of brain function from advanced liver disease. The effects of the condition depend on the type of liver damage and how severe it is. In some cases, hepatic encephalopathy can be reversed. CAUSES The exact cause of hepatic encephalopathy is not known. RISK FACTORS You have a higher risk of getting this condition if your liver is damaged. When the liver is damaged harmful substances called toxins can build up in the body. Certain toxins, such as ammonia, can harm your brain. Conditions that can cause liver damage include:  An infection.  Dehydration.  Intestinal bleeding.  Drinking too much alcohol.  Taking certain medicines, including tranquilizers, water pills (diuretics), antidepressants, or sleeping pills. SIGNS AND SYMPTOMS Signs and symptoms may develop suddenly. Or, they may develop slowly and get worse gradually. Symptoms can range from mild to severe. Mild Hepatic Encephalopathy  Mild confusion.  Personality and mood changes.  Anxiety and agitation.  Drowsiness.  Loss of mental  abilities.  Musty or sweet-smelling breath. Worsening or Severe Hepatic Encephalopathy  Slowed movement.  Slurred speech.  Extreme personality changes.  Disorientation.  Abnormal shaking or flapping of the hands.  Coma. DIAGNOSIS To make a diagnosis, your health care provider will do a physical exam. To rule out other causes of your signs and symptoms, he or she may order tests. You may have:  Blood tests. These may be done to check your ammonia level, measure how long it takes your blood to clot, and check for infection.  Liver function tests. These may be done to check how well your liver is working.  MRI and CT scans. These may be done to check for a brain disorder.  Electroencephalogram (EEG). This may be done to measure the electrical activity in your brain. TREATMENT The first step in treatment is identifying and treating possible triggers. The next step is involves taking medicine to lower the level of toxins in the body and to prevent ammonia from building up. You may need to take:  Antibiotics to reduce the ammonia-producing bacteria in your gut.  Lactulose to help flush ammonia from the gut. HOME CARE INSTRUCTIONS Eating and Drinking  Follow a low-protein diet that includes plenty of fruits, vegetables, and whole grains, as directed by your health care provider. Ammonia is produced when you digest high-protein foods.  Work with a Data processing manager or with your health care provider to make sure you are getting the right balance of protein and minerals.  Drink enough fluids to keep your urine clear or pale yellow. Drinking plenty of water helps prevent constipation.  Do not drink alcohol or use illegal drugs. Medicines  Only take medicine as directed by your health care provider.  If you were prescribed an antibiotic medicine, finish it all even if you start to feel better.  Do not start any new medicines, including over-the-counter medicines, without first checking  with your health care provider. SEEK MEDICAL CARE IF:  You have new symptoms.  Your symptoms change.  Your symptoms get worse.  You have a fever.  You are constipated.  You have persistent nausea, vomiting, or diarrhea. SEEK IMMEDIATE MEDICAL CARE IF:  You become very confused or drowsy.  You vomit blood or material that looks like coffee grounds.  Your stool is bloody or black or looks like tar.   This information is not intended to replace advice given to you by your health care provider. Make sure you discuss any questions you have with your health care provider.   Document Released: 04/25/2006 Document Revised: 03/06/2014 Document Reviewed: 10/01/2013 Elsevier Interactive Patient Education 2016 Elsevier Inc.  Lactulose oral solution What is this medicine? LACTULOSE (LAK tyoo lose) is a laxative derived from lactose. It helps to treat chronic constipation and to treat or prevent hepatic encephalopathy or coma. These are brain disorders that result from liver disease. This medicine may be used for other purposes; ask your health care provider or pharmacist if you have questions. What should I tell my health care provider before I take this medicine? They need to know if you have any of these conditions: -need a galactose-free diet -scheduled for surgery -an unusual or allergic reaction to lactulose, other sugars, medicines, foods, dyes or preservatives -pregnant or trying to get pregnant -breast-feeding How should I use this medicine? Take this medicine mouth. Follow the directions on the prescription label. Shake well before using. Use a specially marked spoon or container to measure your medicine. Ask your pharmacist if you do not have one. Household spoons are not accurate. Take your doses at regular intervals. Do not take your medicine more often than directed. You may be directed to take this medicine rectally. If so, you must follow specific directions from your  doctor or healthcare professional. Please contact him or her. Talk to your pediatrician regarding the use of this medicine in children. Special care may be needed. Overdosage: If you think you have taken too much of this medicine contact a poison control center or emergency room at once. NOTE: This medicine is only for you. Do not share this medicine with others. What if I miss a dose? If you miss a dose, take it as soon as you can. If it is almost time for your next dose, take only that dose. Do not take double or extra doses. What may interact with this medicine? -antacids -neomycin -other laxatives This list may not describe all possible interactions. Give your health care provider a list of all the medicines, herbs, non-prescription drugs, or dietary supplements you use. Also tell them if you smoke, drink alcohol, or use illegal drugs. Some items may interact with your medicine. What should I watch for while using this medicine? This medicine may not produce any result for 24 to 48 hours. Do not take this medicine for longer than directed by your doctor or health care professional. Drink plenty of water with each dose of this medicine. What side effects may I notice from receiving this medicine? Side effects that you should report to your doctor or health care professional as soon as possible: -diarrhea Side  effects that usually do not require medical attention (report to your doctor or health care professional if they continue or are bothersome): -belching, flatulence -nausea or vomiting -stomach pain or discomfort This list may not describe all possible side effects. Call your doctor for medical advice about side effects. You may report side effects to FDA at 1-800-FDA-1088. Where should I keep my medicine? Keep out of the reach of children. This medicine may darken in color under normal storage conditions. This is because of the sugar solution and does not affect the way the medicine  works. If the solution becomes extremely dark in color, contact your health care professional before use. Store at room temperature between 15 and 30 degrees C (59 and 86 degrees F). Do not freeze. Keep container tightly closed. Throw away any unused medicine after the expiration date. NOTE: This sheet is a summary. It may not cover all possible information. If you have questions about this medicine, talk to your doctor, pharmacist, or health care provider.    2016, Elsevier/Gold Standard. (2007-08-20 16:04:57)

## 2015-12-03 NOTE — Discharge Summary (Signed)
Name: [name] MRN: [id number] DOB: [birth year] 70 y.o. PCP: No primary care provider on file.  Date of Admission: 11/28/2015  4:55 PM Date of Discharge: 12/03/2015 Attending Physician: Cliffton AstersJohn Campbell, MD  Discharge Diagnosis: 1. Altered mental status most likely secondary to hepatic encephalopathy 2. History of atrial fibrillation with rapid ventricular response   Discharge Medications:   Medication List    STOP taking these medications   amiodarone 200 MG tablet Commonly known as:  PACERONE   apixaban 5 MG Tabs tablet Commonly known as:  ELIQUIS   carvedilol 6.25 MG tablet Commonly known as:  COREG   losartan 25 MG tablet Commonly known as:  COZAAR   spironolactone 25 MG tablet Commonly known as:  ALDACTONE     TAKE these medications   aspirin EC 81 MG tablet Take 81 mg by mouth daily.   atorvastatin 40 MG tablet Commonly known as:  LIPITOR Take 1 tablet (40 mg total) by mouth daily at 6 PM.   feeding supplement Liqd Take 1 Container by mouth 3 (three) times daily between meals.   lactulose 10 GM/15ML solution Commonly known as:  CHRONULAC Take 45 mLs (30 g total) by mouth 3 (three) times daily. Titrate to 3 bowel movement daily.       Disposition and follow-up:   [name] was [facility] in Good condition.  At the hospital follow up visit please address:  1.  Please discuss with the patient about starting meds for atrial fibrillation. For more details see below under #2. Please ensure the patient continues to take lactulose.  2.  Labs / imaging needed at time of follow-up: None  3.  Pending labs/ test needing follow-up: None  Follow-up Appointments:   Hospital Course by problem list:   1. Altered mental status most likely secondary to hepatic encephalopathy The patient presented to the [facility] emergency department on 11/28/2015 with altered mental status. The patient's daughter states that around  4 PM on the day of admission the family had finished eating dinner and the granddaughter noticed that the patient was bending over trying to pick up something that was not there. He appeared confused and was not able to speak clearly. At this time the family called 911 and he was [facility]. In the emergency department the patient was afebrile and hemodynamically stable. Labs demonstrated an elevated ammonia 58, AST of 89 and ALT of 81. UDS was only positive for THC. TSH and T4 were normal. CT and MRI were obtained which were both negative for acute infarct or other intracranial pathology. The patient was then admitted to [facility] internal medicine service for further workup and evaluation of his altered mental status.  Once admitted the patient had a right upper quadrant ultrasound which demonstrated hepatic architecture consistent with cirrhosis. The patient does have a history of hepatitis C infection and alcohol use in the past which are most likely the etiology of the patient's cirrhosis. Once admitted the primary etiology of the differential diagnosis for the patient's altered mental status was an acute decompensation of hepatic encephalopathy. He was treated with lactulose 30 mg 3 times a day and this was titrated until he achieved approximately 3-4 bowel movements per day. Over the course of his stay the patient initially became more altered. This increase in the patient's altered mental status was most likely secondary to hospital delirium and changes in his sleep wake cycle. We continued  the patient on lactulose and reoriented him during the daytime to reinitiate his normal circadian rhythm. After the patient was able to get sleep and have several bowel movements he returned to his baseline mental status. On 12/02/2015 the patient was formally oriented 3 and had good by mouth intake. He continued to improve over the night and by 12/03/2015 remained formally oriented 3. On  the day of discharge the patient's family was in the hospital room and stated that he was back to his baseline. He will be discharged on lactulose 30 mg 3 times daily. We instructed the patient to take this to ensure he has 3-4 bowel movements per day.  2. History of atrial fibrillation and rapid ventricular response The patient had a recent hospitalization secondary to atrial fibrillation with rapid ventricular response. At the time he was discharged on Apaxaban and amiodarone with plans to follow-up. Speaking with the patient he did not follow-up and had not been on his medications. At the time of presentation to [facility] for altered mental status the patient was not on these medications. During the course of his hospitalization the patient was only noted to be in sinus rhythm without evidence of atrial fibrillation. We discussed with the patient why he was not taking his medications and he expressed that these medications were too costly for him to afford and that he was not taking them at this time. The patient and his daughter stated that they will have [facility] with [facility] and discuss medication options at that time. We discussed with the patient and his family the risks associated with atrial fibrillation and he endorsed understanding of these risks. At this time we will not resume these medications until they have reestablish care with the [place]. Additionally, given the patient's recent altered mental status and history of an unsteady gait and frailty I also think  restarting the patient on anticoagulation for paroxysmal atrial fibrillation may not be best at this time. He will need outpatient follow-up to further discuss management of his history of atrial fibrillation. He and his daughters agreed that he will [facility] with [facility] and discuss these issues at that time.  Discharge Vitals:   BP 115/61 (BP Location: Left Arm)   Pulse 69   Temp 98.9 F (37.2 C) (Oral)   Resp 18    Ht 6' (1.829 m)   Wt 114 lb (51.7 kg)   SpO2 98%   BMI 15.46 kg/m   Pertinent Labs, Studies, and Procedures:  1. CT head-no acute intra-abdominal abnormality, no evidence of acute infarct 2. MRI brain-no evidence of acute infarct, hydrocephalus or focal mass effect   Discharge Instructions: Discharge Instructions    Diet - low sodium heart healthy    Complete by:  As directed    Increase activity slowly    Complete by:  As directed       Signed: Thomasene Lot, MD 12/03/2015, 2:12 PM   Pager: [phone]

## 2015-12-03 NOTE — Progress Notes (Signed)
Nsg Discharge Note  Admit Date:  11/28/2015 Discharge date: 12/03/2015   [name] to be D/C'd Home per MD order.  AVS completed.  Copy for chart, and copy for patient signed, and dated. Patient/caregiver able to verbalize understanding.  Discharge Medication:   Medication List    STOP taking these medications   amiodarone 200 MG tablet Commonly known as:  PACERONE   apixaban 5 MG Tabs tablet Commonly known as:  ELIQUIS   carvedilol 6.25 MG tablet Commonly known as:  COREG   losartan 25 MG tablet Commonly known as:  COZAAR   spironolactone 25 MG tablet Commonly known as:  ALDACTONE     TAKE these medications   aspirin EC 81 MG tablet Take 81 mg by mouth daily.   atorvastatin 40 MG tablet Commonly known as:  LIPITOR Take 1 tablet (40 mg total) by mouth daily at 6 PM.   feeding supplement Liqd Take 1 Container by mouth 3 (three) times daily between meals.   lactulose 10 GM/15ML solution Commonly known as:  CHRONULAC Take 45 mLs (30 g total) by mouth 3 (three) times daily. Titrate to 3 bowel movement daily.       Discharge Assessment: Vitals:   12/03/15 0555 12/03/15 1441  BP:  110/66  Pulse:  74  Resp:  17  Temp: 98.9 F (37.2 C) 98.8 F (37.1 C)   Skin clean, dry and intact without evidence of skin break down, no evidence of skin tears noted. IV catheter discontinued intact. Site without signs and symptoms of complications - no redness or edema noted at insertion site, patient denies c/o pain - only slight tenderness at site.  Dressing with slight pressure applied.  D/c Instructions-Education: Discharge instructions given to patient/family with verbalized understanding. D/c education completed with patient/family including follow up instructions, medication list, d/c activities limitations if indicated, with other d/c instructions as indicated by MD - patient able to verbalize understanding, all questions fully answered. Patient instructed to return to  ED, call 911, or call MD for any changes in condition.  Patient escorted via WC, and D/C home via private auto.  Chanay Nugent Consuella Lose, RN 12/03/2015 4:40 PM

## 2015-12-03 NOTE — Progress Notes (Signed)
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 He required assistance with the aid of a walker. Has equipment at home. He was steady with walking. He and the family feel comfortable with discharge.

## 2015-12-07 ENCOUNTER — Other Ambulatory Visit: Payer: Self-pay | Admitting: Internal Medicine

## 2016-10-05 ENCOUNTER — Emergency Department: Payer: Medicare Other

## 2016-10-05 ENCOUNTER — Encounter: Payer: Self-pay | Admitting: *Deleted

## 2016-10-05 ENCOUNTER — Inpatient Hospital Stay
Admission: EM | Admit: 2016-10-05 | Discharge: 2016-10-09 | DRG: 291 | Disposition: A | Discharge status: Home / Self Care | Payer: Medicare Other | Attending: Internal Medicine | Admitting: Internal Medicine

## 2016-10-05 DIAGNOSIS — I4891 Unspecified atrial fibrillation: Secondary | ICD-10-CM | POA: Diagnosis present

## 2016-10-05 DIAGNOSIS — Z7982 Long term (current) use of aspirin: Secondary | ICD-10-CM

## 2016-10-05 DIAGNOSIS — E7800 Pure hypercholesterolemia, unspecified: Secondary | ICD-10-CM | POA: Diagnosis present

## 2016-10-05 DIAGNOSIS — Z79899 Other long term (current) drug therapy: Secondary | ICD-10-CM | POA: Diagnosis not present

## 2016-10-05 DIAGNOSIS — I5023 Acute on chronic systolic (congestive) heart failure: Secondary | ICD-10-CM | POA: Diagnosis present

## 2016-10-05 DIAGNOSIS — E43 Unspecified severe protein-calorie malnutrition: Secondary | ICD-10-CM | POA: Diagnosis present

## 2016-10-05 DIAGNOSIS — I739 Peripheral vascular disease, unspecified: Secondary | ICD-10-CM | POA: Diagnosis present

## 2016-10-05 DIAGNOSIS — F419 Anxiety disorder, unspecified: Secondary | ICD-10-CM | POA: Diagnosis present

## 2016-10-05 DIAGNOSIS — I110 Hypertensive heart disease with heart failure: Secondary | ICD-10-CM | POA: Diagnosis not present

## 2016-10-05 DIAGNOSIS — J209 Acute bronchitis, unspecified: Secondary | ICD-10-CM | POA: Diagnosis present

## 2016-10-05 DIAGNOSIS — Z89611 Acquired absence of right leg above knee: Secondary | ICD-10-CM

## 2016-10-05 DIAGNOSIS — F17210 Nicotine dependence, cigarettes, uncomplicated: Secondary | ICD-10-CM | POA: Diagnosis present

## 2016-10-05 DIAGNOSIS — Z681 Body mass index (BMI) 19 or less, adult: Secondary | ICD-10-CM

## 2016-10-05 DIAGNOSIS — R0602 Shortness of breath: Secondary | ICD-10-CM | POA: Diagnosis not present

## 2016-10-05 DIAGNOSIS — Z9119 Patient's noncompliance with other medical treatment and regimen: Secondary | ICD-10-CM | POA: Diagnosis not present

## 2016-10-05 DIAGNOSIS — J440 Chronic obstructive pulmonary disease with acute lower respiratory infection: Secondary | ICD-10-CM | POA: Diagnosis present

## 2016-10-05 DIAGNOSIS — R059 Cough, unspecified: Secondary | ICD-10-CM

## 2016-10-05 DIAGNOSIS — J441 Chronic obstructive pulmonary disease with (acute) exacerbation: Secondary | ICD-10-CM | POA: Diagnosis present

## 2016-10-05 DIAGNOSIS — E876 Hypokalemia: Secondary | ICD-10-CM | POA: Diagnosis present

## 2016-10-05 DIAGNOSIS — I428 Other cardiomyopathies: Secondary | ICD-10-CM | POA: Diagnosis present

## 2016-10-05 DIAGNOSIS — B1920 Unspecified viral hepatitis C without hepatic coma: Secondary | ICD-10-CM | POA: Diagnosis present

## 2016-10-05 DIAGNOSIS — M19049 Primary osteoarthritis, unspecified hand: Secondary | ICD-10-CM | POA: Diagnosis present

## 2016-10-05 DIAGNOSIS — J449 Chronic obstructive pulmonary disease, unspecified: Secondary | ICD-10-CM | POA: Diagnosis present

## 2016-10-05 DIAGNOSIS — K7460 Unspecified cirrhosis of liver: Secondary | ICD-10-CM | POA: Diagnosis present

## 2016-10-05 DIAGNOSIS — I480 Paroxysmal atrial fibrillation: Secondary | ICD-10-CM | POA: Diagnosis present

## 2016-10-05 DIAGNOSIS — L89309 Pressure ulcer of unspecified buttock, unspecified stage: Secondary | ICD-10-CM | POA: Diagnosis present

## 2016-10-05 DIAGNOSIS — K219 Gastro-esophageal reflux disease without esophagitis: Secondary | ICD-10-CM | POA: Diagnosis present

## 2016-10-05 DIAGNOSIS — R05 Cough: Secondary | ICD-10-CM

## 2016-10-05 HISTORY — DX: Paroxysmal atrial fibrillation: I48.0

## 2016-10-05 HISTORY — DX: Tobacco use: Z72.0

## 2016-10-05 HISTORY — DX: Cannabis abuse, uncomplicated: F12.10

## 2016-10-05 HISTORY — DX: Atherosclerotic heart disease of native coronary artery without angina pectoris: I25.10

## 2016-10-05 HISTORY — DX: Other cardiomyopathies: I42.8

## 2016-10-05 HISTORY — DX: Chronic systolic (congestive) heart failure: I50.22

## 2016-10-05 LAB — BASIC METABOLIC PANEL
Anion gap: 7 (ref 5–15)
BUN: 30 mg/dL — AB (ref 6–20)
CALCIUM: 8.9 mg/dL (ref 8.9–10.3)
CO2: 25 mmol/L (ref 22–32)
CREATININE: 1.01 mg/dL (ref 0.61–1.24)
Chloride: 104 mmol/L (ref 101–111)
GFR calc Af Amer: >60 mL/min (ref >60)
GLUCOSE: 115 mg/dL — AB (ref 65–99)
Potassium: 4.1 mmol/L (ref 3.5–5.1)
Sodium: 136 mmol/L (ref 135–145)

## 2016-10-05 LAB — CBC
HCT: 39.8 % — ABNORMAL LOW (ref 40.0–52.0)
Hemoglobin: 13.1 g/dL (ref 13.0–18.0)
MCH: 30.7 pg (ref 26.0–34.0)
MCHC: 33 g/dL (ref 32.0–36.0)
MCV: 93 fL (ref 80.0–100.0)
Platelets: 109 10*3/uL — ABNORMAL LOW (ref 150–440)
RBC: 4.28 MIL/uL — ABNORMAL LOW (ref 4.40–5.90)
RDW: 15.3 % — AB (ref 11.5–14.5)
WBC: 5.8 10*3/uL (ref 3.8–10.6)

## 2016-10-05 LAB — BRAIN NATRIURETIC PEPTIDE: B NATRIURETIC PEPTIDE 5: 798 pg/mL — AB (ref 0.0–100.0)

## 2016-10-05 LAB — TROPONIN I

## 2016-10-05 MED ORDER — VERAPAMIL HCL 2.5 MG/ML IV SOLN
5.0000 mg | Freq: Once | INTRAVENOUS | Status: AC
  Administered 2016-10-05: 5 mg via INTRAVENOUS
  Filled 2016-10-05: qty 2

## 2016-10-05 MED ORDER — FUROSEMIDE 10 MG/ML IJ SOLN: 20.0000 mg | Freq: Once | INTRAMUSCULAR | Status: DC

## 2016-10-05 MED ORDER — DILTIAZEM HCL 100 MG IV SOLR
5.0000 mg/h | Freq: Once | INTRAVENOUS | Status: AC
  Administered 2016-10-05: 5 mg/h via INTRAVENOUS
  Filled 2016-10-05: qty 100

## 2016-10-05 MED ORDER — ALBUTEROL SULFATE (2.5 MG/3ML) 0.083% IN NEBU: 5.0000 mg | INHALATION_SOLUTION | Freq: Once | RESPIRATORY_TRACT | Status: DC

## 2016-10-05 MED ORDER — FUROSEMIDE 40 MG PO TABS
20.0000 mg | ORAL_TABLET | Freq: Once | ORAL | Status: AC
  Administered 2016-10-05: 20 mg via ORAL
  Filled 2016-10-05: qty 1

## 2016-10-05 NOTE — ED Notes (Signed)
Pt in rapid afib. Pt to room 4.

## 2016-10-05 NOTE — ED Notes (Signed)
Patient transported to X-ray 

## 2016-10-05 NOTE — ED Notes (Signed)
Pharmacy notified to send Cardizem dose. 

## 2016-10-05 NOTE — H&P (Deleted)
[facility] - [place] at [facility]   PATIENT NAME: [name]    MR#:  [id number]  DATE OF BIRTH:  12/11/1945  DATE OF ADMISSION:  10/05/2016  PRIMARY CARE PHYSICIAN: [facility]   REQUESTING/REFERRING PHYSICIAN: Mayford Knife, MD  CHIEF COMPLAINT:   Chief Complaint  Patient presents with  . Shortness of Breath    HISTORY OF PRESENT ILLNESS:  [name]  is a 71 y.o. male who presents with Progressive shortness of breath and palpitations with chest discomfort. Patient states that his shortness of breath has been building, and he thought it was his COPD acting up. Had some orthopnea. He is only been diagnosed with A. fib in the recent past, with a prior hospital stay for A. fib with RVR at a different facility. He comes in today and is found to be again in A. fib with RVR with acute on chronic heart failure. [facility] is on diversion and so hospitalists were called for admission.  PAST MEDICAL HISTORY:   Past Medical History:  Diagnosis Date  . Anxiety   . Arthritis    "hands" (11/29/2015)  . Cirrhosis of liver (HCC)   . COPD (chronic obstructive pulmonary disease) (HCC)   . GERD (gastroesophageal reflux disease)   . Headache    "weekly, if that" (11/29/2015)  . 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 Hepatitis C   . High cholesterol   . History of blood transfusion 2000  . Hypertension   . Kidney stones    "alot"  . Pneumonia 05/2015  . PVD (peripheral vascular disease) (HCC)   . Tuberculosis 2007    PAST SURGICAL HISTORY:   Past Surgical History:  Procedure Laterality Date  . ABOVE KNEE LEG AMPUTATION Right 2000  . CARDIAC CATHETERIZATION N/A 06/25/2015   Procedure: Right/Left Heart Cath and Coronary Angiography;  Surgeon: Dolores Patty, MD;  Location: [facility] INVASIVE CV LAB;  Service: Cardiovascular;  Laterality: N/A;  . CATARACT EXTRACTION W/ INTRAOCULAR LENS  IMPLANT, BILATERAL Bilateral ~ 2003  . EXTRACORPOREAL  SHOCK WAVE LITHOTRIPSY    . LEG AMPUTATION BELOW KNEE Right 2000    SOCIAL HISTORY:   Social History  Substance Use Topics  . Smoking status: Current Some Day Smoker    Years: 50.00    Types: Cigarettes  . Smokeless tobacco: Never Used  . Alcohol use Yes     Comment: 11/29/2015 "used to drink; quit ~ 2000"    FAMILY HISTORY:   Family History  Problem Relation Age of Onset  . Dementia Mother     DRUG ALLERGIES:  No Known Allergies  MEDICATIONS AT HOME:   Prior to Admission medications   Medication Sig Start Date End Date Taking? Authorizing Provider  aspirin 325 MG tablet Take 325 mg by mouth daily.   Yes [provider]  atorvastatin (LIPITOR) 40 MG tablet Take 1 tablet (40 mg total) by mouth daily at 6 PM. Patient not taking: Reported on 11/28/2015 06/28/15   Darrick Huntsman, MD  feeding supplement (BOOST / RESOURCE BREEZE) LIQD Take 1 Container by mouth 3 (three) times daily between meals. Patient not taking: Reported on 10/05/2016 12/03/15   Valentino Nose, MD  lactulose (CHRONULAC) 10 GM/15ML solution TAKE BY MOUTH THREE TIMES DAILY *TITRATE TO THREE BOWEL MOVEMENTS DAILY* Patient not taking: Reported on 10/05/2016 12/08/15   Valentino Nose, MD    REVIEW OF SYSTEMS:  Review of Systems  Constitutional: Negative for chills, fever, malaise/fatigue and weight loss.  HENT: Negative for ear pain, hearing loss and tinnitus.   Eyes: Negative for blurred vision, double vision, pain and redness.  Respiratory: Positive for shortness of breath. Negative for cough and hemoptysis.   Cardiovascular: Positive for chest pain and palpitations. Negative for orthopnea and leg swelling.  Gastrointestinal: Negative for abdominal pain, constipation, diarrhea, nausea and vomiting.  Genitourinary: Negative for dysuria, frequency and hematuria.  Musculoskeletal: Negative for back pain, joint pain and neck pain.  Skin:       No acne, rash, or lesions  Neurological: Negative for  dizziness, tremors, focal weakness and weakness.  Endo/Heme/Allergies: Negative for polydipsia. Does not bruise/bleed easily.  Psychiatric/Behavioral: Negative for depression. The patient is not nervous/anxious and does not have insomnia.      VITAL SIGNS:   Vitals:   10/05/16 2058 10/05/16 2100 10/05/16 2200 10/05/16 2245  BP:  101/77 99/80 121/86  Pulse: 72 (!) 56 (!) 106   Resp: (!) 21 (!) 23 10 (!) 22  Temp:      TempSrc:      SpO2: 96% 97% 97%   Weight:      Height:       Wt Readings from Last 3 Encounters:  10/05/16 47.2 kg (104 lb)  11/28/15 51.7 kg (114 lb)  06/24/15 49.9 kg (110 lb)    PHYSICAL EXAMINATION:  Physical Exam  Vitals reviewed. Constitutional: He is oriented to person, place, and time. He appears well-developed and well-nourished. No distress.  HENT:  Head: Normocephalic and atraumatic.  Mouth/Throat: Oropharynx is clear and moist.  Eyes: Pupils are equal, round, and reactive to light. Conjunctivae and EOM are normal. No scleral icterus.  Neck: Normal range of motion. Neck supple. No JVD present. No thyromegaly present.  Cardiovascular: Intact distal pulses.  Exam reveals no gallop and no friction rub.   No murmur heard. Irregular rhythm, tachycardia  Respiratory: Effort normal. No respiratory distress. He has no wheezes. He has rales.  GI: Soft. Bowel sounds are normal. He exhibits no distension. There is no tenderness.  Musculoskeletal: Normal range of motion. He exhibits deformity (Lower extremity amputation). He exhibits no edema.  No arthritis, no gout  Lymphadenopathy:    He has no cervical adenopathy.  Neurological: He is alert and oriented to person, place, and time. No cranial nerve deficit.  No dysarthria, no aphasia  Skin: Skin is warm and dry. No rash noted. No erythema.  Psychiatric: He has a normal mood and affect. His behavior is normal. Judgment and thought content normal.    LABORATORY PANEL:   CBC  Recent Labs Lab  10/05/16 2016  WBC 5.8  HGB 13.1  HCT 39.8*  PLT 109*   ------------------------------------------------------------------------------------------------------------------  Chemistries   Recent Labs Lab 10/05/16 2016  NA 136  K 4.1  CL 104  CO2 25  GLUCOSE 115*  BUN 30*  CREATININE 1.01  CALCIUM 8.9   ------------------------------------------------------------------------------------------------------------------  Cardiac Enzymes  Recent Labs Lab 10/05/16 2016  TROPONINI <0.03   ------------------------------------------------------------------------------------------------------------------  RADIOLOGY:  Dg Chest 2 View  Result Date: 10/05/2016 CLINICAL DATA:  Shortness of breath and COPD.  Chest pain. EXAM: CHEST  2 VIEW COMPARISON:  Chest radiograph 11/28/2015 FINDINGS: There is cardiomegaly with calcific aortic atherosclerosis. There are multiple dense opacities again seen within the left upper lobe and right mid lung. No focal airspace consolidation. There is bibasilar atelectasis. Small bilateral pleural effusions. IMPRESSION: Small bilateral pleural effusions and associated atelectasis. Otherwise, unchanged appearance of bilateral chronic densities without active cardiopulmonary disease. Electronically  Signed   By: Deatra Robinson M.D.   On: 10/05/2016 20:50    EKG:   Orders placed or performed during the hospital encounter of 10/05/16  . ED EKG within 10 minutes  . ED EKG within 10 minutes  . EKG 12-Lead  . EKG 12-Lead    IMPRESSION AND PLAN:  Principal Problem:   Acute on chronic systolic CHF (congestive heart failure) (HCC) - IV Lasix given in the ED, patient is artery made significant urine, repeat dose will be given. Echocardiogram in the morning and cardiology consult Active Problems:   Atrial fibrillation with RVR (HCC) - patient was placed on diltiazem drip, will continue this with cardiology consult as above   COPD (chronic obstructive pulmonary  disease) (HCC) - continue home meds   PAD (peripheral artery disease) (HCC) - continue home meds   Hepatic cirrhosis (HCC) - avoid nephrotoxins and monitor  All the records are reviewed and case discussed with ED provider. Management plans discussed with the patient and/or family.  DVT PROPHYLAXIS: SubQ lovenox  GI PROPHYLAXIS: None  ADMISSION STATUS: Inpatient  CODE STATUS: Full Code Status History    Date Active Date Inactive Code Status Order ID Comments User Context   11/29/2015  4:15 AM 12/04/2015 12:35 AM Full Code [id number]  Fuller Plan, MD ED   06/23/2015  9:44 AM 06/28/2015  9:33 PM Full Code [id number]  Courtney Paris, MD Inpatient      TOTAL TIME TAKING CARE OF THIS PATIENT: 45 minutes.   Bradely Rudin FIELDING 10/05/2016, 11:51 PM  [facility]  Office  [phone]  CC: Primary care physician; [facility]  Note:  This document was prepared using Dragon voice recognition software and may include unintentional dictation errors.

## 2016-10-05 NOTE — ED Notes (Signed)
Meal tray given to patient.

## 2016-10-05 NOTE — ED Notes (Addendum)
Pharmacy notified to send Verapamil dose.

## 2016-10-05 NOTE — ED Provider Notes (Signed)
[facility] on full diversion, we will attempt to admit the patient here.   Emily Filbert, MD 10/05/16 [phone]

## 2016-10-05 NOTE — ED Triage Notes (Signed)
Pt to triage via wheelchair. Pt has copd.  Pt reports sob and chest pain for 1 week.  No n/v/d.  Pt has

## 2016-10-05 NOTE — ED Provider Notes (Signed)
[facility] Emergency Department Provider Note       Time seen: ----------------------------------------- 8:33 PM on 10/05/2016 -----------------------------------------     I have reviewed the triage vital signs and the nursing notes.   HISTORY   Chief Complaint Shortness of Breath    HPI [name] is a 71 y.o. male who presents to the ED for shortness of breath and chest pain for last week. Nothing makes his symptoms better or worse. He has had some cough, currently he states he does not have his medication to help his breathing. He does not work chronic home oxygen. Denies fevers, chills or other complaints. He does report a history of atrial fibrillation   Past Medical History:  Diagnosis Date  . Anxiety   . Arthritis    "hands" (11/29/2015)  . Cirrhosis of liver (HCC)   . COPD (chronic obstructive pulmonary disease) (HCC)   . GERD (gastroesophageal reflux disease)   . Headache    "weekly, if that" (11/29/2015)  . 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 Hepatitis C   . High cholesterol   . History of blood transfusion 2000  . Hypertension   . Kidney stones    "alot"  . Pneumonia 05/2015  . PVD (peripheral vascular disease) (HCC)   . Tuberculosis 2007    Patient Active Problem List   Diagnosis Date Noted  . Hepatic cirrhosis (HCC) 12/02/2015  . Pressure injury of skin 12/01/2015  . COPD (chronic obstructive pulmonary disease) (HCC) 12/01/2015  . PAD (peripheral artery disease) (HCC) 12/01/2015  . Encephalopathy 11/28/2015  . Lingular pneumonia   . Panlobular emphysema (HCC)   . Hepatitis C antibody test positive   . TB (tuberculosis), treated   . Acute systolic heart failure (HCC)   . Pressure ulcer 06/24/2015  . Protein-calorie malnutrition, severe 06/24/2015  . Atrial fibrillation with RVR (HCC) 06/23/2015    Past Surgical History:  Procedure Laterality Date  . ABOVE KNEE LEG AMPUTATION Right 2000  . CARDIAC  CATHETERIZATION N/A 06/25/2015   Procedure: Right/Left Heart Cath and Coronary Angiography;  Surgeon: Dolores Patty, MD;  Location: [facility] INVASIVE CV LAB;  Service: Cardiovascular;  Laterality: N/A;  . CATARACT EXTRACTION W/ INTRAOCULAR LENS  IMPLANT, BILATERAL Bilateral ~ 2003  . EXTRACORPOREAL SHOCK WAVE LITHOTRIPSY    . LEG AMPUTATION BELOW KNEE Right 2000    Allergies Patient has no known allergies.  Social History Social History  Substance Use Topics  . Smoking status: Current Some Day Smoker    Years: 50.00    Types: Cigarettes  . Smokeless tobacco: Never Used  . Alcohol use Yes     Comment: 11/29/2015 "used to drink; quit ~ 2000"    Review of Systems Constitutional: Negative for fever. Cardiovascular: Negative for chest pain. Respiratory:Positive for shortness of breath Gastrointestinal: Negative for abdominal pain, vomiting and diarrhea. Genitourinary: Negative for dysuria. Musculoskeletal: Negative for back pain. Skin: Negative for rash. Neurological: Negative for headaches, focal weakness or numbness.  All systems negative/normal/unremarkable except as stated in the HPI  ____________________________________________   PHYSICAL EXAM:  VITAL SIGNS: ED Triage Vitals  Enc Vitals Group     BP 10/05/16 2011 107/82     Pulse Rate 10/05/16 2011 74     Resp 10/05/16 2011 (!) 22     Temp 10/05/16 2011 98.2 F (36.8 C)     Temp Source 10/05/16 2011 Oral     SpO2 10/05/16 2011 95 %     Weight 10/05/16 2006 104  lb (47.2 kg)     Height 10/05/16 2006 6' (1.829 m)     Head Circumference --      Peak Flow --      Pain Score 10/05/16 2006 1     Pain Loc --      Pain Edu? --      Excl. in GC? --     Constitutional: Alert and oriented. Mild distress Eyes: Conjunctivae are normal. Normal extraocular movements. ENT   Head: Normocephalic and atraumatic.   Nose: No congestion/rhinnorhea.   Mouth/Throat: Mucous membranes are moist.   Neck: No  stridor. Cardiovascular: Rapid rate, irregular rhythm No murmurs, rubs, or gallops. Respiratory: Diminished breath sounds but overall mostly clear bilaterally. Some wheezing is noted Gastrointestinal: Soft and nontender. Normal bowel sounds Musculoskeletal: Nontender with normal range of motion in extremities. No lower extremity tenderness nor edema. Neurologic:  Normal speech and language. No gross focal neurologic deficits are appreciated.  Skin:  Skin is warm, dry and intact. No rash noted. Psychiatric: Mood and affect are normal. Speech and behavior are normal.  ____________________________________________  EKG: Interpreted by me. Atrial fibrillation with rapid ventricular response, rate is 145 bpm, normal QRS, normal QT, normal axis.  ____________________________________________  ED COURSE:  Pertinent labs & imaging results that were available during my care of the patient were reviewed by me and considered in my medical decision making (see chart for details). Patient presents for dyspnea noted to have rapid A. fib on arrival, we will assess with labs and imaging as indicated.   Procedures ____________________________________________   LABS (pertinent positives/negatives)  Labs Reviewed  BASIC METABOLIC PANEL - Abnormal; Notable for the following:       Result Value   Glucose, Bld 115 (*)    BUN 30 (*)    All other components within normal limits  CBC - Abnormal; Notable for the following:    RBC 4.28 (*)    HCT 39.8 (*)    RDW 15.3 (*)    Platelets 109 (*)    All other components within normal limits  TROPONIN I  BRAIN NATRIURETIC PEPTIDE   CRITICAL CARE Performed by: Emily Filbert   Total critical care time: 30 minutes  Critical care time was exclusive of separately billable procedures and treating other patients.  Critical care was necessary to treat or prevent imminent or life-threatening deterioration.  Critical care was time spent personally by me  on the following activities: development of treatment plan with patient and/or surrogate as well as nursing, discussions with consultants, evaluation of patient's response to treatment, examination of patient, obtaining history from patient or surrogate, ordering and performing treatments and interventions, ordering and review of laboratory studies, ordering and review of radiographic studies, pulse oximetry and re-evaluation of patient's condition.  RADIOLOGY Images were viewed by me  Chest x-ray IMPRESSION: Small bilateral pleural effusions and associated atelectasis. Otherwise, unchanged appearance of bilateral chronic densities without active cardiopulmonary disease. ____________________________________________  FINAL ASSESSMENT AND PLAN  Dyspnea, rapid atrial fibrillation  Plan: Patient's labs and imaging were dictated above. Patient had presented for Shortness of breath is likely multifactorial. Patient has bilateral pleural effusions but was also in rapid atrial fibrillation. We gave him a single dose of verapamil and start him on a Cardizem drip due to persistent tachycardia. He is a [place] patient, we will attempt transfer to the [facility].   Emily Filbert, MD   Note: This note was generated in part or whole with voice recognition software.  Voice recognition is usually quite accurate but there are transcription errors that can and very often do occur. I apologize for any typographical errors that were not detected and corrected.     Emily Filbert, MD 10/05/16 2142

## 2016-10-06 ENCOUNTER — Encounter: Payer: Self-pay | Admitting: Emergency Medicine

## 2016-10-06 ENCOUNTER — Inpatient Hospital Stay (HOSPITAL_COMMUNITY)
Admit: 2016-10-06 | Discharge: 2016-10-06 | Disposition: A | Discharge status: Home / Self Care | Payer: Medicare Other | Attending: Internal Medicine | Admitting: Internal Medicine

## 2016-10-06 DIAGNOSIS — I4891 Unspecified atrial fibrillation: Secondary | ICD-10-CM

## 2016-10-06 DIAGNOSIS — I5023 Acute on chronic systolic (congestive) heart failure: Secondary | ICD-10-CM

## 2016-10-06 DIAGNOSIS — I361 Nonrheumatic tricuspid (valve) insufficiency: Secondary | ICD-10-CM

## 2016-10-06 LAB — BASIC METABOLIC PANEL
ANION GAP: 7 (ref 5–15)
BUN: 23 mg/dL — ABNORMAL HIGH (ref 6–20)
CALCIUM: 8.5 mg/dL — AB (ref 8.9–10.3)
CHLORIDE: 104 mmol/L (ref 101–111)
CO2: 29 mmol/L (ref 22–32)
CREATININE: 0.92 mg/dL (ref 0.61–1.24)
GFR calc non Af Amer: >60 mL/min (ref >60)
Glucose, Bld: 99 mg/dL (ref 65–99)
Potassium: 3.4 mmol/L — ABNORMAL LOW (ref 3.5–5.1)
SODIUM: 140 mmol/L (ref 135–145)

## 2016-10-06 LAB — TROPONIN I

## 2016-10-06 LAB — CBC
HCT: 38.8 % — ABNORMAL LOW (ref 40.0–52.0)
HEMOGLOBIN: 12.7 g/dL — AB (ref 13.0–18.0)
MCH: 30.7 pg (ref 26.0–34.0)
MCHC: 32.7 g/dL (ref 32.0–36.0)
MCV: 93.7 fL (ref 80.0–100.0)
PLATELETS: 110 10*3/uL — AB (ref 150–440)
RBC: 4.13 MIL/uL — AB (ref 4.40–5.90)
RDW: 15.4 % — ABNORMAL HIGH (ref 11.5–14.5)
WBC: 6.5 10*3/uL (ref 3.8–10.6)

## 2016-10-06 LAB — ECHOCARDIOGRAM COMPLETE
Height: 72 in
WEIGHTICAEL: 1524.8 [oz_av]

## 2016-10-06 MED ORDER — APIXABAN 5 MG PO TABS
5.0000 mg | ORAL_TABLET | Freq: Two times a day (BID) | ORAL | Status: DC
  Administered 2016-10-06 – 2016-10-09 (×7): 5 mg via ORAL
  Filled 2016-10-06 (×7): qty 1

## 2016-10-06 MED ORDER — ASPIRIN 325 MG PO TABS
325.0000 mg | ORAL_TABLET | Freq: Every day | ORAL | Status: DC
  Filled 2016-10-06: qty 1

## 2016-10-06 MED ORDER — POTASSIUM CHLORIDE CRYS ER 20 MEQ PO TBCR
20.0000 meq | EXTENDED_RELEASE_TABLET | Freq: Once | ORAL | Status: AC
  Administered 2016-10-06: 20 meq via ORAL
  Filled 2016-10-06: qty 1

## 2016-10-06 MED ORDER — ONDANSETRON HCL 4 MG/2ML IJ SOLN: 4.0000 mg | Freq: Four times a day (QID) | INTRAMUSCULAR | Status: DC | PRN

## 2016-10-06 MED ORDER — LOSARTAN POTASSIUM 25 MG PO TABS
12.5000 mg | ORAL_TABLET | Freq: Every day | ORAL | Status: DC
  Administered 2016-10-07 – 2016-10-09 (×3): 12.5 mg via ORAL
  Filled 2016-10-06 (×3): qty 1

## 2016-10-06 MED ORDER — LORAZEPAM 2 MG/ML IJ SOLN
1.0000 mg | Freq: Once | INTRAMUSCULAR | Status: AC
  Administered 2016-10-06: 1 mg via INTRAVENOUS
  Filled 2016-10-06: qty 1

## 2016-10-06 MED ORDER — ONDANSETRON HCL 4 MG PO TABS
4.0000 mg | ORAL_TABLET | Freq: Four times a day (QID) | ORAL | Status: DC | PRN
Start: 2016-10-06 — End: 2016-10-09

## 2016-10-06 MED ORDER — ENOXAPARIN SODIUM 40 MG/0.4ML ~~LOC~~ SOLN: 40.0000 mg | SUBCUTANEOUS | Status: DC

## 2016-10-06 MED ORDER — DILTIAZEM HCL 100 MG IV SOLR
5.0000 mg/h | INTRAVENOUS | Status: DC
  Administered 2016-10-06: 7.5 mg/h via INTRAVENOUS
  Filled 2016-10-06: qty 100

## 2016-10-06 MED ORDER — BISOPROLOL FUMARATE 5 MG PO TABS
10.0000 mg | ORAL_TABLET | Freq: Every day | ORAL | Status: DC
  Administered 2016-10-06 – 2016-10-08 (×3): 10 mg via ORAL
  Filled 2016-10-06 (×4): qty 2

## 2016-10-06 MED ORDER — FUROSEMIDE 20 MG PO TABS: 20.0000 mg | ORAL_TABLET | Freq: Once | ORAL | Status: DC

## 2016-10-06 MED ORDER — ATORVASTATIN CALCIUM 20 MG PO TABS
40.0000 mg | ORAL_TABLET | Freq: Every day | ORAL | Status: DC
  Administered 2016-10-06 – 2016-10-08 (×3): 40 mg via ORAL
  Filled 2016-10-06 (×3): qty 2

## 2016-10-06 NOTE — H&P (Signed)
[facility] - [place] at [facility]   PATIENT NAME: [name]    MR#:  [id number]  DATE OF BIRTH:  04-27-1945  DATE OF ADMISSION:  10/05/2016  PRIMARY CARE PHYSICIAN: [facility]   REQUESTING/REFERRING PHYSICIAN: Mayford Knife, MD  CHIEF COMPLAINT:      Chief Complaint  Patient presents with  . Shortness of Breath    HISTORY OF PRESENT ILLNESS:  [name]  is a 71 y.o. male who presents with Progressive shortness of breath and palpitations with chest discomfort. Patient states that his shortness of breath has been building, and he thought it was his COPD acting up. Had some orthopnea. He is only been diagnosed with A. fib in the recent past, with a prior hospital stay for A. fib with RVR at a different facility. He comes in today and is found to be again in A. fib with RVR with acute on chronic heart failure. [facility] is on diversion and so hospitalists were called for admission.  PAST MEDICAL HISTORY:       Past Medical History:  Diagnosis Date  . Anxiety   . Arthritis    "hands" (11/29/2015)  . Cirrhosis of liver (HCC)   . COPD (chronic obstructive pulmonary disease) (HCC)   . GERD (gastroesophageal reflux disease)   . Headache    "weekly, if that" (11/29/2015)  . 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 Hepatitis C   . High cholesterol   . History of blood transfusion 2000  . Hypertension   . Kidney stones    "alot"  . Pneumonia 05/2015  . PVD (peripheral vascular disease) (HCC)   . Tuberculosis 2007    PAST SURGICAL HISTORY:        Past Surgical History:  Procedure Laterality Date  . ABOVE KNEE LEG AMPUTATION Right 2000  . CARDIAC CATHETERIZATION N/A 06/25/2015   Procedure: Right/Left Heart Cath and Coronary Angiography;  Surgeon: Dolores Patty, MD;  Location: [facility] INVASIVE CV LAB;  Service: Cardiovascular;  Laterality: N/A;  . CATARACT EXTRACTION W/ INTRAOCULAR LENS  IMPLANT,  BILATERAL Bilateral ~ 2003  . EXTRACORPOREAL SHOCK WAVE LITHOTRIPSY    . LEG AMPUTATION BELOW KNEE Right 2000    SOCIAL HISTORY:          Social History  Substance Use Topics  . Smoking status: Current Some Day Smoker    Years: 50.00    Types: Cigarettes  . Smokeless tobacco: Never Used  . Alcohol use Yes      Comment: 11/29/2015 "used to drink; quit ~ 2000"    FAMILY HISTORY:        Family History  Problem Relation Age of Onset  . Dementia Mother     DRUG ALLERGIES:  No Known Allergies  MEDICATIONS AT HOME:          Prior to Admission medications   Medication Sig Start Date End Date Taking? Authorizing Provider  aspirin 325 MG tablet Take 325 mg by mouth daily.   Yes [provider]  atorvastatin (LIPITOR) 40 MG tablet Take 1 tablet (40 mg total) by mouth daily at 6 PM. Patient not taking: Reported on 11/28/2015 06/28/15   Darrick Huntsman, MD  feeding supplement (BOOST / RESOURCE BREEZE) LIQD Take 1 Container by mouth 3 (three) times daily between meals. Patient not taking: Reported on 10/05/2016 12/03/15   Valentino Nose, MD  lactulose (CHRONULAC) 10 GM/15ML solution TAKE BY MOUTH THREE TIMES DAILY *TITRATE TO THREE BOWEL MOVEMENTS DAILY*  Patient not taking: Reported on 10/05/2016 12/08/15   Valentino Nose, MD    REVIEW OF SYSTEMS:  Review of Systems  Constitutional: Negative for chills, fever, malaise/fatigue and weight loss.  HENT: Negative for ear pain, hearing loss and tinnitus.   Eyes: Negative for blurred vision, double vision, pain and redness.  Respiratory: Positive for shortness of breath. Negative for cough and hemoptysis.   Cardiovascular: Positive for chest pain and palpitations. Negative for orthopnea and leg swelling.  Gastrointestinal: Negative for abdominal pain, constipation, diarrhea, nausea and vomiting.  Genitourinary: Negative for dysuria, frequency and hematuria.  Musculoskeletal: Negative for back  pain, joint pain and neck pain.  Skin:       No acne, rash, or lesions  Neurological: Negative for dizziness, tremors, focal weakness and weakness.  Endo/Heme/Allergies: Negative for polydipsia. Does not bruise/bleed easily.  Psychiatric/Behavioral: Negative for depression. The patient is not nervous/anxious and does not have insomnia.      VITAL SIGNS:         Vitals:   10/05/16 2058 10/05/16 2100 10/05/16 2200 10/05/16 2245  BP:  101/77 99/80 121/86  Pulse: 72 (!) 56 (!) 106   Resp: (!) 21 (!) 23 10 (!) 22  Temp:      TempSrc:      SpO2: 96% 97% 97%   Weight:      Height:          Wt Readings from Last 3 Encounters:  10/05/16 47.2 kg (104 lb)  11/28/15 51.7 kg (114 lb)  06/24/15 49.9 kg (110 lb)    PHYSICAL EXAMINATION:  Physical Exam  Vitals reviewed. Constitutional: He is oriented to person, place, and time. He appears well-developed and well-nourished. No distress.  HENT:  Head: Normocephalic and atraumatic.  Mouth/Throat: Oropharynx is clear and moist.  Eyes: Pupils are equal, round, and reactive to light. Conjunctivae and EOM are normal. No scleral icterus.  Neck: Normal range of motion. Neck supple. No JVD present. No thyromegaly present.  Cardiovascular: Intact distal pulses.  Exam reveals no gallop and no friction rub.   No murmur heard. Irregular rhythm, tachycardia  Respiratory: Effort normal. No respiratory distress. He has no wheezes. He has rales.  GI: Soft. Bowel sounds are normal. He exhibits no distension. There is no tenderness.  Musculoskeletal: Normal range of motion. He exhibits deformity (Lower extremity amputation). He exhibits no edema.  No arthritis, no gout  Lymphadenopathy:    He has no cervical adenopathy.  Neurological: He is alert and oriented to person, place, and time. No cranial nerve deficit.  No dysarthria, no aphasia  Skin: Skin is warm and dry. No rash noted. No erythema.  Psychiatric: He has a normal  mood and affect. His behavior is normal. Judgment and thought content normal.    LABORATORY PANEL:   CBC  Last Labs    Recent Labs Lab 10/05/16 2016  WBC 5.8  HGB 13.1  HCT 39.8*  PLT 109*     ------------------------------------------------------------------------------------------------------------------  Chemistries   Last Labs    Recent Labs Lab 10/05/16 2016  NA 136  K 4.1  CL 104  CO2 25  GLUCOSE 115*  BUN 30*  CREATININE 1.01  CALCIUM 8.9     ------------------------------------------------------------------------------------------------------------------  Cardiac Enzymes  Last Labs    Recent Labs Lab 10/05/16 2016  TROPONINI <0.03     ------------------------------------------------------------------------------------------------------------------  RADIOLOGY:   Imaging Results (Last 48 hours)  Dg Chest 2 View  Result Date: 10/05/2016 CLINICAL DATA:  Shortness of breath and  COPD.  Chest pain. EXAM: CHEST  2 VIEW COMPARISON:  Chest radiograph 11/28/2015 FINDINGS: There is cardiomegaly with calcific aortic atherosclerosis. There are multiple dense opacities again seen within the left upper lobe and right mid lung. No focal airspace consolidation. There is bibasilar atelectasis. Small bilateral pleural effusions. IMPRESSION: Small bilateral pleural effusions and associated atelectasis. Otherwise, unchanged appearance of bilateral chronic densities without active cardiopulmonary disease. Electronically Signed   By: Deatra Robinson M.D.   On: 10/05/2016 20:50     EKG:      Orders placed or performed during the hospital encounter of 10/05/16  . ED EKG within 10 minutes  . ED EKG within 10 minutes  . EKG 12-Lead  . EKG 12-Lead    IMPRESSION AND PLAN:  Principal Problem:   Acute on chronic systolic CHF (congestive heart failure) (HCC) - IV Lasix given in the ED, patient is artery made significant urine, repeat dose will be given.  Echocardiogram in the morning and cardiology consult Active Problems:   Atrial fibrillation with RVR (HCC) - patient was placed on diltiazem drip, will continue this with cardiology consult as above   COPD (chronic obstructive pulmonary disease) (HCC) - continue home meds   PAD (peripheral artery disease) (HCC) - continue home meds   Hepatic cirrhosis (HCC) - avoid nephrotoxins and monitor  All the records are reviewed and case discussed with ED provider. Management plans discussed with the patient and/or family.  DVT PROPHYLAXIS: SubQ lovenox  GI PROPHYLAXIS: None  ADMISSION STATUS: Inpatient  CODE STATUS: Full          Code Status History    Date Active Date Inactive Code Status Order ID Comments User Context   11/29/2015  4:15 AM 12/04/2015 12:35 AM Full Code [id number]  Fuller Plan, MD ED   06/23/2015  9:44 AM 06/28/2015  9:33 PM Full Code [id number]  Courtney Paris, MD Inpatient      TOTAL TIME TAKING CARE OF THIS PATIENT: 45 minutes.   Mccall Will FIELDING 10/05/2016, 11:51 PM  [facility]  Office  [phone]  CC: Primary care physician; [facility]  Note: This document was prepared using Dragon voice recognition software and may include unintentional dictation errors.

## 2016-10-06 NOTE — Consult Note (Deleted)
[facility] - [place] at [facility]   PATIENT NAME: [name]    MR#:  [id number]  DATE OF BIRTH:  02/29/1945  DATE OF ADMISSION:  10/05/2016  PRIMARY CARE PHYSICIAN: [facility]   REQUESTING/REFERRING PHYSICIAN: Mayford Knife, MD  CHIEF COMPLAINT:      Chief Complaint  Patient presents with  . Shortness of Breath    HISTORY OF PRESENT ILLNESS:  [name]  is a 71 y.o. male who presents with Progressive shortness of breath and palpitations with chest discomfort. Patient states that his shortness of breath has been building, and he thought it was his COPD acting up. Had some orthopnea. He is only been diagnosed with A. fib in the recent past, with a prior hospital stay for A. fib with RVR at a different facility. He comes in today and is found to be again in A. fib with RVR with acute on chronic heart failure. [facility] is on diversion and so hospitalists were called for admission, though [facility] then [facility] ED that they would accept patient and pt requested transfer  PAST MEDICAL HISTORY:       Past Medical History:  Diagnosis Date  . Anxiety   . Arthritis    "hands" (11/29/2015)  . Cirrhosis of liver (HCC)   . COPD (chronic obstructive pulmonary disease) (HCC)   . GERD (gastroesophageal reflux disease)   . Headache    "weekly, if that" (11/29/2015)  . 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 Hepatitis C   . High cholesterol   . History of blood transfusion 2000  . Hypertension   . Kidney stones    "alot"  . Pneumonia 05/2015  . PVD (peripheral vascular disease) (HCC)   . Tuberculosis 2007    PAST SURGICAL HISTORY:        Past Surgical History:  Procedure Laterality Date  . ABOVE KNEE LEG AMPUTATION Right 2000  . CARDIAC CATHETERIZATION N/A 06/25/2015   Procedure: Right/Left Heart Cath and Coronary Angiography;  Surgeon: Dolores Patty, MD;  Location: [facility] INVASIVE CV LAB;   Service: Cardiovascular;  Laterality: N/A;  . CATARACT EXTRACTION W/ INTRAOCULAR LENS  IMPLANT, BILATERAL Bilateral ~ 2003  . EXTRACORPOREAL SHOCK WAVE LITHOTRIPSY    . LEG AMPUTATION BELOW KNEE Right 2000    SOCIAL HISTORY:          Social History  Substance Use Topics  . Smoking status: Current Some Day Smoker    Years: 50.00    Types: Cigarettes  . Smokeless tobacco: Never Used  . Alcohol use Yes      Comment: 11/29/2015 "used to drink; quit ~ 2000"    FAMILY HISTORY:        Family History  Problem Relation Age of Onset  . Dementia Mother     DRUG ALLERGIES:  No Known Allergies  MEDICATIONS AT HOME:          Prior to Admission medications   Medication Sig Start Date End Date Taking? Authorizing Provider  aspirin 325 MG tablet Take 325 mg by mouth daily.   Yes [provider]  atorvastatin (LIPITOR) 40 MG tablet Take 1 tablet (40 mg total) by mouth daily at 6 PM. Patient not taking: Reported on 11/28/2015 06/28/15   Darrick Huntsman, MD  feeding supplement (BOOST / RESOURCE BREEZE) LIQD Take 1 Container by mouth 3 (three) times daily between meals. Patient not taking: Reported on 10/05/2016 12/03/15   Valentino Nose, MD  lactulose [facility])  10 GM/15ML solution TAKE BY MOUTH THREE TIMES DAILY *TITRATE TO THREE BOWEL MOVEMENTS DAILY* Patient not taking: Reported on 10/05/2016 12/08/15   Valentino Nose, MD    REVIEW OF SYSTEMS:  Review of Systems  Constitutional: Negative for chills, fever, malaise/fatigue and weight loss.  HENT: Negative for ear pain, hearing loss and tinnitus.   Eyes: Negative for blurred vision, double vision, pain and redness.  Respiratory: Positive for shortness of breath. Negative for cough and hemoptysis.   Cardiovascular: Positive for chest pain and palpitations. Negative for orthopnea and leg swelling.  Gastrointestinal: Negative for abdominal pain, constipation, diarrhea, nausea and vomiting.   Genitourinary: Negative for dysuria, frequency and hematuria.  Musculoskeletal: Negative for back pain, joint pain and neck pain.  Skin:       No acne, rash, or lesions  Neurological: Negative for dizziness, tremors, focal weakness and weakness.  Endo/Heme/Allergies: Negative for polydipsia. Does not bruise/bleed easily.  Psychiatric/Behavioral: Negative for depression. The patient is not nervous/anxious and does not have insomnia.      VITAL SIGNS:         Vitals:   10/05/16 2058 10/05/16 2100 10/05/16 2200 10/05/16 2245  BP:  101/77 99/80 121/86  Pulse: 72 (!) 56 (!) 106   Resp: (!) 21 (!) 23 10 (!) 22  Temp:      TempSrc:      SpO2: 96% 97% 97%   Weight:      Height:          Wt Readings from Last 3 Encounters:  10/05/16 47.2 kg (104 lb)  11/28/15 51.7 kg (114 lb)  06/24/15 49.9 kg (110 lb)    PHYSICAL EXAMINATION:  Physical Exam  Vitals reviewed. Constitutional: He is oriented to person, place, and time. He appears well-developed and well-nourished. No distress.  HENT:  Head: Normocephalic and atraumatic.  Mouth/Throat: Oropharynx is clear and moist.  Eyes: Pupils are equal, round, and reactive to light. Conjunctivae and EOM are normal. No scleral icterus.  Neck: Normal range of motion. Neck supple. No JVD present. No thyromegaly present.  Cardiovascular: Intact distal pulses.  Exam reveals no gallop and no friction rub.   No murmur heard. Irregular rhythm, tachycardia  Respiratory: Effort normal. No respiratory distress. He has no wheezes. He has rales.  GI: Soft. Bowel sounds are normal. He exhibits no distension. There is no tenderness.  Musculoskeletal: Normal range of motion. He exhibits deformity (Lower extremity amputation). He exhibits no edema.  No arthritis, no gout  Lymphadenopathy:    He has no cervical adenopathy.  Neurological: He is alert and oriented to person, place, and time. No cranial nerve deficit.  No dysarthria, no  aphasia  Skin: Skin is warm and dry. No rash noted. No erythema.  Psychiatric: He has a normal mood and affect. His behavior is normal. Judgment and thought content normal.    LABORATORY PANEL:   CBC  Last Labs    Recent Labs Lab 10/05/16 2016  WBC 5.8  HGB 13.1  HCT 39.8*  PLT 109*     ------------------------------------------------------------------------------------------------------------------  Chemistries   Last Labs    Recent Labs Lab 10/05/16 2016  NA 136  K 4.1  CL 104  CO2 25  GLUCOSE 115*  BUN 30*  CREATININE 1.01  CALCIUM 8.9     ------------------------------------------------------------------------------------------------------------------  Cardiac Enzymes  Last Labs    Recent Labs Lab 10/05/16 2016  TROPONINI <0.03     ------------------------------------------------------------------------------------------------------------------  RADIOLOGY:   Imaging Results (Last 48 hours)  Dg Chest 2 View  Result Date: 10/05/2016 CLINICAL DATA:  Shortness of breath and COPD.  Chest pain. EXAM: CHEST  2 VIEW COMPARISON:  Chest radiograph 11/28/2015 FINDINGS: There is cardiomegaly with calcific aortic atherosclerosis. There are multiple dense opacities again seen within the left upper lobe and right mid lung. No focal airspace consolidation. There is bibasilar atelectasis. Small bilateral pleural effusions. IMPRESSION: Small bilateral pleural effusions and associated atelectasis. Otherwise, unchanged appearance of bilateral chronic densities without active cardiopulmonary disease. Electronically Signed   By: Deatra Robinson M.D.   On: 10/05/2016 20:50     EKG:      Orders placed or performed during the hospital encounter of 10/05/16  . ED EKG within 10 minutes  . ED EKG within 10 minutes  . EKG 12-Lead  . EKG 12-Lead    IMPRESSION AND PLAN:  Principal Problem:   Acute on chronic systolic CHF (congestive heart failure) (HCC) -  IV Lasix given in the ED, patient is artery made significant urine, recommend repeat lasix dose and workup at [facility] hospital Active Problems:   Atrial fibrillation with RVR (HCC) - patient was placed on diltiazem drip, [facility]   COPD (chronic obstructive pulmonary disease) (HCC) - continue home meds   PAD (peripheral artery disease) (HCC) - continue home meds   Hepatic cirrhosis (HCC) - avoid nephrotoxins and monitor  All the records are reviewed and case discussed with ED provider. Management plans discussed with the patient and/or family.  DVT PROPHYLAXIS: SubQ lovenox  GI PROPHYLAXIS: None  CODE STATUS: Full          Code Status History    Date Active Date Inactive Code Status Order ID Comments User Context   11/29/2015  4:15 AM 12/04/2015 12:35 AM Full Code [id number]  Fuller Plan, MD ED   06/23/2015  9:44 AM 06/28/2015  9:33 PM Full Code [id number]  Courtney Paris, MD Inpatient      TOTAL TIME TAKING CARE OF THIS PATIENT: 45 minutes.   Analisa Sledd FIELDING 10/05/2016, 11:51 PM  [facility]  Office  [phone]  CC: Primary care physician; [facility]  Note: This document was prepared using Dragon voice recognition software and may include unintentional dictation errors.

## 2016-10-06 NOTE — Progress Notes (Signed)
*  PRELIMINARY RESULTS* Echocardiogram 2D Echocardiogram has been performed.  Cristela Blue 10/06/2016, 9:51 AM

## 2016-10-06 NOTE — Consult Note (Signed)
WOC Nurse wound consult note Reason for Consult: Patient with 2 cm x 2 cm x 0.2 cm denuded skin to upper buttocks in gluteal fold.  Present on admission.  Wound type:moisture associated skin damage.  Pressure Injury POA: Yes pressure and moisture,   Measurement: 2 cm x 2 cm x 0.2 cm  Wound bed: pink and moist Drainage (amount, consistency, odor) scant serous Periwound:intact Dressing procedure/placement/frequency: cleanse wound with soap and water.  Apply Boudreaux butt paste twice daily.  No disposable briefs in bed.   Will not follow at this time.  Please re-consult if needed.  Maple Hudson RN BSN CWON Pager [phone]

## 2016-10-06 NOTE — Progress Notes (Signed)
Initial Nutrition Assessment  DOCUMENTATION CODES:   Severe malnutrition in context of chronic illness  INTERVENTION:  1. Ensure Enlive po BID, each supplement provides 350 kcal and 20 grams of protein 2. Encouraged protein intake, calorie intake given severe malnutrition and continued weight loss.  NUTRITION DIAGNOSIS:   Malnutrition (Severe) related to chronic illness (CHF, COPD) as evidenced by severe depletion of muscle mass, severe depletion of body fat, percent weight loss.  GOAL:   Patient will meet greater than or equal to 90% of their needs  MONITOR:   PO intake, I & O's, Labs, Supplement acceptance, Weight trends  REASON FOR ASSESSMENT:   Malnutrition Screening Tool    ASSESSMENT:   [name] is a 71 year old male with a PMH of COPD, Cirrhosis, GERD, Anxiety, HTN, PVD, Cirrhosis, s/p R AKA in 2000 presents with shortness of breath, palpitations and chest discomfort found to be in A. Fib with RVR and acute on chronic CHF  Spoke with [name] at bedside.  He reports anxiety along with poor appetite. Has been losing weight but is unsure how much. Does know he is at 95# now. Per chart, exhibits a 19#/17% severe wt loss over 10 months. States what he eats at home varies from day to day. Did not provide significant diet history. Discussed protein intake and he states his PCP told him to limit protein due to his cirrhosis. He also has issues with chewing, only has 8 teeth, but eats soft food, seems to manage ok. Ate breakfast potatoes, scrambled eggs, grits, orange juice, banana, coffee for breakfast. Appetite seems to be improving. Encouraged him to increase overall PO intake and protein intake, monitor for any signs of confusion or hepatic encephalopathy. The last time he was admitted 11/2015 he had hepatic encephalopathy. Weight loss and poor nutrition status is likely a result of increased needs from COPD.  Labs reviewed:  K 3.4, BNP 798 Medications reviewed and include:    Cardizem gtt Meal Completion: 100% Nutrition-Focused physical exam completed. Findings are severe fat depletion, severe muscle depletion, and no edema.   Diet Order:  Diet Heart Room service appropriate? Yes; Fluid consistency: Thin  Skin:  Wound (see comment) (Stg II to sacrum)  Last BM:  10/04/2016  Height:   Ht Readings from Last 1 Encounters:  10/05/16 6' (1.829 m)    Weight:   Wt Readings from Last 1 Encounters:  10/06/16 95 lb 4.8 oz (43.2 kg)    Ideal Body Weight:  74.4 kg  BMI:  Body mass index is 12.93 kg/m.  Estimated Nutritional Needs:   Kcal:  1300-1500 calories (30-35 cal/kg)  Protein:  65-76 grams (1.5-1.75g/kg)  Fluid:  >/= 1.5L  EDUCATION NEEDS:   No education needs identified at this time  Dionne Ano. Jamelle Goldston, MS, RD LDN Inpatient Clinical Dietitian Pager [phone]

## 2016-10-06 NOTE — Care Management (Addendum)
CM consult present for medication needs.  Patient says he  is known to the [employer] .  He moved to [place] 3 years ago and has not followed up since.  Says he has not had any of his "regular medications" consisting of his meds for diarrhea, hepatitis C, copd, heart,anxiety and depression meds for the past 3 years.  He has "been meaning to" get set up at the [facility] but just has not gotten around to it.  Says transportation to those appointments are a problem.  Discussed that [facility] and [facility] bus has routes to [facility].  Found that patient is not established at [facility]  Has not been seen in 4 years.  If no activity after 2 years, patient is dropped from primary care.  Patient has not established himself with [facility] either.  Discussed with him and his ex wife that he would have to initiate referral process at [facility].  Referral "not accepted" by phone by caseworkers.  Patient does not have a primary care physician.  He does not appear to be motivated to assume any responsibility for coordination of his medical care.  [place] in [place] so is not eligible to be seen at [facility]. Contacted [facility] to schedule appointment but there are no appointments available "5 days out."  Informed they are unable to schedule any appointments for hospital patient further out than 5 days- even if patient does not need to be seen within 5 days.  Patient will require assistance with medications.  If discharges over the week will require MATCH referral. Discussed financial limit of referral and 3 dollar copay. Patient's ex wife's sister will take patient to [facility] (as this location is really more convenient than [facility]) so patient can get re established.  By the end of interaction, patient verbalizing he will be pursuing getting reestablished with the [place] in [place]

## 2016-10-06 NOTE — ED Notes (Signed)
Pt transported to room 250 

## 2016-10-06 NOTE — Progress Notes (Signed)
[employer] [facility] - [place] [facility]   PATIENT NAME: [name]    MR#:  [id number]  DATE OF BIRTH:  Jan 19, 1946  SUBJECTIVE:  CHIEF COMPLAINT:   Chief Complaint  Patient presents with  . Shortness of Breath  Feels short of breath, says he could not afford the medications that he has not taken any of his medication for a while, but will go to [place] now REVIEW OF SYSTEMS:  Review of Systems  Constitutional: Positive for malaise/fatigue. Negative for chills, fever and weight loss.  HENT: Negative for nosebleeds and sore throat.   Eyes: Negative for blurred vision.  Respiratory: Positive for cough and shortness of breath. Negative for wheezing.   Cardiovascular: Negative for chest pain, orthopnea, leg swelling and PND.  Gastrointestinal: Negative for abdominal pain, constipation, diarrhea, heartburn, nausea and vomiting.  Genitourinary: Negative for dysuria and urgency.  Musculoskeletal: Negative for back pain.  Skin: Negative for rash.  Neurological: Positive for weakness. Negative for dizziness, speech change, focal weakness and headaches.  Endo/Heme/Allergies: Does not bruise/bleed easily.  Psychiatric/Behavioral: Negative for depression.    DRUG ALLERGIES:  No Known Allergies VITALS:  Blood pressure 116/83, pulse 99, temperature 98.1 F (36.7 C), temperature source Oral, resp. rate 18, height 6' (1.829 m), weight 43.2 kg (95 lb 4.8 oz), SpO2 94 %. PHYSICAL EXAMINATION:  Physical Exam  Constitutional: He is oriented to person, place, and time. He appears malnourished. He appears unhealthy. He appears cachectic.  HENT:  Head: Normocephalic and atraumatic.  Eyes: Pupils are equal, round, and reactive to light. Conjunctivae and EOM are normal.  Neck: Normal range of motion. Neck supple. No tracheal deviation present. No thyromegaly present.  Cardiovascular: Normal rate, regular rhythm and normal heart sounds.   Pulmonary/Chest: Effort normal and breath sounds  normal. No respiratory distress. He has no wheezes. He exhibits no tenderness.  Abdominal: Soft. Bowel sounds are normal. He exhibits no distension. There is no tenderness.  Musculoskeletal: Normal range of motion.  Right above-knee amputation  Neurological: He is alert and oriented to person, place, and time. No cranial nerve deficit.  Skin: Skin is warm and dry. No rash noted.  Psychiatric: Mood and affect normal.   LABORATORY PANEL:  Male CBC  Recent Labs Lab 10/06/16 0549  WBC 6.5  HGB 12.7*  HCT 38.8*  PLT 110*   ------------------------------------------------------------------------------------------------------------------ Chemistries   Recent Labs Lab 10/06/16 0549  NA 140  K 3.4*  CL 104  CO2 29  GLUCOSE 99  BUN 23*  CREATININE 0.92  CALCIUM 8.5*   RADIOLOGY:  Dg Chest 2 View  Result Date: 10/05/2016 CLINICAL DATA:  Shortness of breath and COPD.  Chest pain. EXAM: CHEST  2 VIEW COMPARISON:  Chest radiograph 11/28/2015 FINDINGS: There is cardiomegaly with calcific aortic atherosclerosis. There are multiple dense opacities again seen within the left upper lobe and right mid lung. No focal airspace consolidation. There is bibasilar atelectasis. Small bilateral pleural effusions. IMPRESSION: Small bilateral pleural effusions and associated atelectasis. Otherwise, unchanged appearance of bilateral chronic densities without active cardiopulmonary disease. Electronically Signed   By: Deatra Robinson M.D.   On: 10/05/2016 20:50   ASSESSMENT AND PLAN:  71 y.o.maleAdmitted with Progressive shortness of breath and palpitations, chest discomfort.   * Acute on chronic systolic CHF (congestive heart failure) (HCC)  - EF of 15-20% per last echo in April 2017 - He is in medically noncompliant, he is euvolemic, BP soft, holding lasix - Started on bisoprolol per cardiology and  low-dose losartan  * Atrial fibrillation with RVR - Started on Eliquis  - Bisoprolol for rate  control instead of cardizem per cardio  *COPD (chronic obstructive pulmonary disease) (HCC) - continue home meds  *PAD (peripheral artery disease)  *Hepatic cirrhosis (HCC) - avoid nephrotoxins and monitor  & Hypokalemia: Replete and recheck   All the records are reviewed and case discussed with Care Management/Social Worker. Management plans discussed with the patient, family and they are in agreement.  CODE STATUS: Full Code  TOTAL TIME TAKING CARE OF THIS PATIENT: 35 minutes.   More than 50% of the time was spent in counseling/coordination of care: YES  POSSIBLE D/C IN 2-3 DAYS, DEPENDING ON CLINICAL CONDITION. And cardio eval   Delfino Lovett M.D on 10/06/2016 at 2:48 PM  Between 7am to 6pm - Pager - [phone]  After 6pm go to [email or web address] - Social research officer, government  [facility]  Office  [phone]  CC: Primary care physician; [facility]  Note: This dictation was prepared with Dragon dictation along with smaller phrase technology. Any transcriptional errors that result from this process are unintentional.

## 2016-10-06 NOTE — Consult Note (Signed)
Cardiology Consult    Patient ID: [name] MRN: [id number], DOB/AGE: December 24, 1945   Admit date: 10/05/2016 Date of Consult: 10/06/2016  Primary Physician: [facility] Primary Cardiologist: was supposed to f/u with D. Bensimhon, MD in CHF clinic but never did. Requesting Provider: V. Sherryll Burger, MD  Patient Profile    [name] is a 71 y.o. male with a history of HTN, HL, PAD, NICM, HFrEF, Hep C with encephalopathy (11/2015), TB COPD, tob/MJ abuse, PAF, and noncompliance, who is being seen today for the evaluation of recurrent Afib and dyspnea at the request of Dr. Sherryll Burger.  Past Medical History   Past Medical History:  Diagnosis Date  . Anxiety   . Arthritis    "hands" (11/29/2015)  . Chronic systolic CHF (congestive heart failure) (HCC)    a. 05/2015 Echo: EF 15-20%, diff HK.  Marland Kitchen Cirrhosis of liver (HCC)   . COPD (chronic obstructive pulmonary disease) (HCC)   . GERD (gastroesophageal reflux disease)   . Headache    "weekly, if that" (11/29/2015)  . 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 Hepatitis C   . High cholesterol   . History of blood transfusion 2000  . Hypertension   . Kidney stones    "alot"  . Marijuana abuse   . NICM (nonischemic cardiomyopathy) (HCC)    a. 05/2015 Echo: EF 15-20%, diff HK, mod MR, sev dil LA, mild TR, PASP ;  b. 05/2015 R/LHC: LM nl, LAD 30d, LCX nl, RCA 40ost, 41m, EF 20%.  . Non-obstructive CAD (coronary artery disease)    a. 05/2015 R/LHC: LM nl, LAD 30d, LCX nl, RCA 40ost, 53m, EF 20%.  . Paroxysmal atrial fibrillation (HCC)    a. 05/2015 - converted on amio. Also placed on Eliquis (CHA2DS2VASc = 3), but never took b/c he couldn't afford and didn't f/u @ [place].  Marland Kitchen Pneumonia 05/2015  . 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  . Tobacco abuse   . Tuberculosis 2007    Past Surgical History:  Procedure Laterality Date  . ABOVE KNEE LEG AMPUTATION Right 2000  . CARDIAC  CATHETERIZATION N/A 06/25/2015   Procedure: Right/Left Heart Cath and Coronary Angiography;  Surgeon: Dolores Patty, MD;  Location: [facility] INVASIVE CV LAB;  Service: Cardiovascular;  Laterality: N/A;  . CATARACT EXTRACTION W/ INTRAOCULAR LENS  IMPLANT, BILATERAL Bilateral ~ 2003  . EXTRACORPOREAL SHOCK WAVE LITHOTRIPSY       Allergies  No Known Allergies  History of Present Illness    71 y/o ? with the above complex PMH including HTN, HL, PAD s/p R AKA (2000), hepatitis C with admission for encephalopathy in 11/2015, tob abuse, marijuana abuse. COPD, TB, noncompliance, NICM, and PAF.  The majority of his care in the past was provided @ the [place], though he hasn't been back there in some time.  He was admitted to [facility] in 05/2015 with CHF and rapid AFib.  EF was 15-20% and cath showed nonobs dzs.  He converted to sinus on amio and was also placed on eliquis with a plan for him to f/u in CHF clinic.  Unfortunately, he never f/u and never filled his Rx due to cost.  He was hospitalized in 11/2015 for hepatic encephalopathy.  He has not f/u with a healthcare provider since.  He says @ baseline, he has chronic DOE and will occasionally note palpitations or a brief episode of c/p.  Over the past few  wks however, he has had more progressive DOE and more frequent palpitations described as heart pounding.  On 8/9, he was profoundly short of breath and so he presented to the ED for evaluation.  Here, he was found to be in rapid Afib.  Trop was nl.  CXR did not show any acute dzs.  He was placed on IV dilt and admitted for further eval.  Breathing is better this am.  He remains in afib in the 70's.  Inpatient Medications    . aspirin  325 mg Oral Daily  . atorvastatin  40 mg Oral q1800  . enoxaparin (LOVENOX) injection  40 mg Subcutaneous Q24H  . furosemide  20 mg Oral Once    Family History    Family History  Problem Relation Age of Onset  . Dementia Mother     Social History    Social History    Social History  . Marital status: Divorced    Spouse name: N/A  . Number of children: N/A  . Years of education: N/A   Occupational History  . Not on file.   Social History Main Topics  . Smoking status: Current Every Day Smoker    Years: 50.00    Types: Cigarettes  . Smokeless tobacco: Never Used     Comment: used to smoke 2ppd but for past 10 yrs has been smoking 1 or less/day - usually just a few puffs off of someone else's cigarette  . Alcohol use No     Comment: used to drink heavily - quit in 2000  . Drug use: Yes    Types: Marijuana     Comment: occasional marijuana cigarette  . Sexual activity: Not on file   Other Topics Concern  . Not on file   Social History Narrative   Lives in [place].  Does not routinely exercise.  Has not f/u with medical providers @ [place].  Does not take previously Rx medications.     Review of Systems    General:  No chills, fever, night sweats or weight changes.  Cardiovascular:  +++ occas chest pain, +++ progressive dyspnea on exertion, no edema, orthopnea, +++ palpitations, no paroxysmal nocturnal dyspnea. Dermatological: No rash, lesions/masses Respiratory: +++ chronic cough and dyspnea Urologic: No hematuria, dysuria Abdominal:   No nausea, vomiting, diarrhea, bright red blood per rectum, melena, or hematemesis Neurologic:  No visual changes, wkns, changes in mental status. All other systems reviewed and are otherwise negative except as noted above.  Physical Exam    Blood pressure 116/83, pulse 99, temperature 98.1 F (36.7 C), temperature source Oral, resp. rate 18, height 6' (1.829 m), weight 95 lb 4.8 oz (43.2 kg), SpO2 94 %.  General: Pleasant, NAD. Frail/ cachectic  Psych: Normal affect. Neuro: Alert and oriented X 3. Moves all extremities spontaneously. HEENT: Normal  Neck: Supple without bruits or JVD. Lungs:  Resp regular and unlabored, diminished breath sounds bilat with faint exp wheezing. Heart: IR, IR, distant, no  s3, s4, or murmurs. Abdomen: Soft, non-tender, non-distended, BS + x 4.  Extremities: No clubbing, cyanosis or edema. R AKA.  Labs     Recent Labs  10/05/16 2016 10/05/16 2347  TROPONINI <0.03 <0.03   Lab Results  Component Value Date   WBC 6.5 10/06/2016   HGB 12.7 (L) 10/06/2016   HCT 38.8 (L) 10/06/2016   MCV 93.7 10/06/2016   PLT 110 (L) 10/06/2016    Recent Labs Lab 10/06/16 0549  NA 140  K 3.4*  CL 104  CO2 29  BUN 23*  CREATININE 0.92  CALCIUM 8.5*  GLUCOSE 99    Radiology Studies    Dg Chest 2 View  Result Date: 10/05/2016 CLINICAL DATA:  Shortness of breath and COPD.  Chest pain. EXAM: CHEST  2 VIEW COMPARISON:  Chest radiograph 11/28/2015 FINDINGS: There is cardiomegaly with calcific aortic atherosclerosis. There are multiple dense opacities again seen within the left upper lobe and right mid lung. No focal airspace consolidation. There is bibasilar atelectasis. Small bilateral pleural effusions. IMPRESSION: Small bilateral pleural effusions and associated atelectasis. Otherwise, unchanged appearance of bilateral chronic densities without active cardiopulmonary disease. Electronically Signed   By: Deatra Robinson M.D.   On: 10/05/2016 20:50    ECG & Cardiac Imaging    Afib, 145, LVH  Assessment & Plan    1.  Afib RVR:  Pt with prior h/o PAF previously followed @ the [place].  He was admitted in 05/2015 with rapid AFib and CHF with an EF of 15-20%.  At that time, he converted to sinus rhythm on amio.  CHA2DS2VASc = 3 and he was placed on eliquis but he never filled Rx due to cost.  He has been on no meds since last year and over the past few wks, he has had increasing DOE and also hard/fast palpitations.  This worsened on 8/9, thus prompting presentation to [facility] ED, where he was found to be in rapid Afib in the 140's.  He has responded well to IV Dilt and rates are currently in the 70's - 80's.  We discussed the importance of oral anticoagulation for stroke prevention  and how ASA does not provide the same level of benefit.  He'd be willing to resume Eliquis and says that he can go to the [place] to get it filled.  I will add eliquis 5 bid.  As the duration of his afib is unclear, he'd need TEE prior to any rhythm mgmt measures - amio vs DCCV.  With h/o hepatic encephalopathy and Hep C, resumption of amio likely not best option.  We'd also want to ensure that he's going to take [facility] following a rhythm mgmt strategy.  Therefor, given the ease with which he has been rate controlled, he may be best served with switching to PO  blocker (avoid dilt in setting of NICM) and allowing for three weeks of therapeutic anticoagulation prior to pursuing DCCV.  Check TSH, Mg.  Supp K.  2.  HFrEF/NICM:  EF 15-20% by echo in 05/2015.  He has not been on any meds @ home and is euvolemic today.  He was prev Rx coreg, losartan, and spiro.  I will d/c dilt and add oral  blocker (bisoprolol given COPD hx).  Add low dose losartan.  If he tolerates, we can look to add spiro tomorrow.  We discussed the importance of daily weights, sodium restriction, medication compliance, and symptom reporting and he verbalizes understanding.  Will benefit from CHF clinic f/u locally - whether or not he decides to get usual cardiac care @ [place].  3.  Tob Abuse/Marijuana Abuse:  Smokes about 1 cigarette/day. Occasional blunt.  Cessation advised.  4.  Hypokalemia:  Supp.  Signed, Nicolasa Ducking, NP 10/06/2016, 11:12 AM

## 2016-10-07 ENCOUNTER — Inpatient Hospital Stay: Payer: Medicare Other

## 2016-10-07 LAB — MAGNESIUM: MAGNESIUM: 1.6 mg/dL — AB (ref 1.7–2.4)

## 2016-10-07 LAB — BASIC METABOLIC PANEL
Anion gap: 6 (ref 5–15)
BUN: 25 mg/dL — AB (ref 6–20)
CHLORIDE: 106 mmol/L (ref 101–111)
CO2: 28 mmol/L (ref 22–32)
Calcium: 8.5 mg/dL — ABNORMAL LOW (ref 8.9–10.3)
Creatinine, Ser: 1.16 mg/dL (ref 0.61–1.24)
GFR calc Af Amer: >60 mL/min (ref >60)
GFR calc non Af Amer: >60 mL/min (ref >60)
GLUCOSE: 153 mg/dL — AB (ref 65–99)
POTASSIUM: 4 mmol/L (ref 3.5–5.1)
Sodium: 140 mmol/L (ref 135–145)

## 2016-10-07 LAB — TSH: TSH: 2.835 u[IU]/mL (ref 0.350–4.500)

## 2016-10-07 MED ORDER — BUDESONIDE 0.25 MG/2ML IN SUSP
0.2500 mg | Freq: Two times a day (BID) | RESPIRATORY_TRACT | Status: DC
  Administered 2016-10-07 – 2016-10-09 (×4): 0.25 mg via RESPIRATORY_TRACT
  Filled 2016-10-07 (×4): qty 2

## 2016-10-07 MED ORDER — AMIODARONE HCL 200 MG PO TABS
400.0000 mg | ORAL_TABLET | Freq: Two times a day (BID) | ORAL | Status: DC
  Administered 2016-10-07 – 2016-10-09 (×4): 400 mg via ORAL
  Filled 2016-10-07 (×5): qty 2

## 2016-10-07 MED ORDER — GUAIFENESIN ER 600 MG PO TB12
600.0000 mg | ORAL_TABLET | Freq: Two times a day (BID) | ORAL | Status: DC
  Administered 2016-10-07 – 2016-10-09 (×5): 600 mg via ORAL
  Filled 2016-10-07 (×5): qty 1

## 2016-10-07 MED ORDER — IPRATROPIUM-ALBUTEROL 0.5-2.5 (3) MG/3ML IN SOLN: 3.0000 mL | Freq: Four times a day (QID) | RESPIRATORY_TRACT | Status: DC | PRN

## 2016-10-07 MED ORDER — METHYLPREDNISOLONE SODIUM SUCC 125 MG IJ SOLR
60.0000 mg | Freq: Four times a day (QID) | INTRAMUSCULAR | Status: DC
  Administered 2016-10-07 – 2016-10-09 (×8): 60 mg via INTRAVENOUS
  Filled 2016-10-07 (×9): qty 2

## 2016-10-07 MED ORDER — POTASSIUM CHLORIDE CRYS ER 20 MEQ PO TBCR
20.0000 meq | EXTENDED_RELEASE_TABLET | Freq: Once | ORAL | Status: DC
  Filled 2016-10-07: qty 1

## 2016-10-07 MED ORDER — IPRATROPIUM-ALBUTEROL 0.5-2.5 (3) MG/3ML IN SOLN
3.0000 mL | Freq: Four times a day (QID) | RESPIRATORY_TRACT | Status: DC
  Administered 2016-10-07 (×2): 3 mL via RESPIRATORY_TRACT
  Filled 2016-10-07 (×2): qty 3

## 2016-10-07 MED ORDER — DIGOXIN 125 MCG PO TABS
0.1250 mg | ORAL_TABLET | Freq: Every day | ORAL | Status: DC
  Administered 2016-10-08 – 2016-10-09 (×2): 0.125 mg via ORAL
  Filled 2016-10-07 (×3): qty 1

## 2016-10-07 MED ORDER — AZITHROMYCIN 250 MG PO TABS
500.0000 mg | ORAL_TABLET | Freq: Every day | ORAL | Status: DC
  Administered 2016-10-07 – 2016-10-08 (×2): 500 mg via ORAL
  Filled 2016-10-07 (×2): qty 2

## 2016-10-07 MED ORDER — MAGNESIUM SULFATE 2 GM/50ML IV SOLN
2.0000 g | Freq: Once | INTRAVENOUS | Status: DC
  Filled 2016-10-07: qty 50

## 2016-10-07 MED ORDER — IPRATROPIUM-ALBUTEROL 0.5-2.5 (3) MG/3ML IN SOLN
3.0000 mL | Freq: Three times a day (TID) | RESPIRATORY_TRACT | Status: DC
  Administered 2016-10-08 (×2): 3 mL via RESPIRATORY_TRACT
  Filled 2016-10-07 (×2): qty 3

## 2016-10-07 MED ORDER — LORAZEPAM 0.5 MG PO TABS
0.5000 mg | ORAL_TABLET | Freq: Four times a day (QID) | ORAL | Status: DC | PRN
  Administered 2016-10-07 – 2016-10-09 (×2): 0.5 mg via ORAL
  Filled 2016-10-07 (×2): qty 1

## 2016-10-07 MED ORDER — FUROSEMIDE 20 MG PO TABS
20.0000 mg | ORAL_TABLET | Freq: Every day | ORAL | Status: DC
  Administered 2016-10-07 – 2016-10-08 (×2): 20 mg via ORAL
  Filled 2016-10-07 (×2): qty 1

## 2016-10-07 NOTE — Progress Notes (Signed)
Patient ID: [name], male   DOB: 25-Feb-1946, 71 y.o.   MRN: [id number]       Subjective:    Patient says he feels better today.  No dyspnea at rest.  HR still in 90s-110s atrial fibrillation.   Objective:   Weight Range: 97 lb 11.2 oz (44.3 kg) Body mass index is 13.25 kg/m.   Vital Signs:   Temp:  [97.7 F (36.5 C)-98.5 F (36.9 C)] 98.2 F (36.8 C) (08/11 1306) Pulse Rate:  [73-103] 100 (08/11 1306) Resp:  [16-20] 18 (08/11 1306) BP: (92-108)/(66-93) 103/71 (08/11 1306) SpO2:  [91 %-98 %] 93 % (08/11 1306) Weight:  [97 lb 11.2 oz (44.3 kg)] 97 lb 11.2 oz (44.3 kg) (08/11 0452) Last BM Date: 10/04/16  Weight change: Filed Weights   10/05/16 2006 10/06/16 0152 10/07/16 0452  Weight: 104 lb (47.2 kg) 95 lb 4.8 oz (43.2 kg) 97 lb 11.2 oz (44.3 kg)    Intake/Output:   Intake/Output Summary (Last 24 hours) at 10/07/16 1316 Last data filed at 10/07/16 1100  Gross per 24 hour  Intake              480 ml  Output              800 ml  Net             -320 ml      Physical Exam    General:  Well appearing. No resp difficulty HEENT: Normal Neck: Supple. JVP not elevated.  Cor: PMI nondisplaced. Mildly tachy, irregular rate & rhythm. No rubs, gallops or murmurs. Lungs: Distant BS bilaterally Abdomen: Soft, nontender, nondistended. No hepatosplenomegaly. No bruits or masses. Good bowel sounds. Extremities: No cyanosis, clubbing, rash, edema Neuro: Alert & orientedx3, cranial nerves grossly intact. moves all 4 extremities w/o difficulty. Affect pleasant   Telemetry   Personally reviewed, atrial fibrillation 90s-110s  Labs    CBC  Recent Labs  10/05/16 2016 10/06/16 0549  WBC 5.8 6.5  HGB 13.1 12.7*  HCT 39.8* 38.8*  MCV 93.0 93.7  PLT 109* 110*   Basic Metabolic Panel  Recent Labs  10/05/16 2016 10/06/16 0549 10/07/16 0453  NA 136 140  --   K 4.1 3.4*  --   CL 104 104  --   CO2 25 29  --   GLUCOSE 115* 99  --   BUN 30* 23*  --     CREATININE 1.01 0.92  --   CALCIUM 8.9 8.5*  --   MG  --   --  1.6*   Liver Function Tests No results for input(s): AST, ALT, ALKPHOS, BILITOT, PROT, ALBUMIN in the last 72 hours. No results for input(s): LIPASE, AMYLASE in the last 72 hours. Cardiac Enzymes  Recent Labs  10/05/16 2016 10/05/16 2347  TROPONINI <0.03 <0.03    BNP: BNP (last 3 results)  Recent Labs  10/05/16 2016  BNP 798.0*    ProBNP (last 3 results) No results for input(s): PROBNP in the last 8760 hours.   D-Dimer No results for input(s): DDIMER in the last 72 hours. Hemoglobin A1C No results for input(s): HGBA1C in the last 72 hours. Fasting Lipid Panel No results for input(s): CHOL, HDL, LDLCALC, TRIG, CHOLHDL, LDLDIRECT in the last 72 hours. Thyroid Function Tests  Recent Labs  10/07/16 0453  TSH 2.835    Other results:   Imaging    Dg Chest Port 1 View  Result Date: 10/07/2016 CLINICAL DATA:  Sudden onset  chest pain EXAM: PORTABLE CHEST 1 VIEW COMPARISON:  10/05/2016 FINDINGS: Cardiac shadow is enlarged. Aortic calcifications are again seen. Diffuse chronic calcifications are noted throughout both lungs. Minimal blunting of the costophrenic angles is again seen and stable. No new focal infiltrate is seen. IMPRESSION: Tiny pleural effusions bilaterally. Chronic parenchymal scarring and calcifications. Electronically Signed   By: Alcide Clever M.D.   On: 10/07/2016 07:23      Medications:     Scheduled Medications: . apixaban  5 mg Oral BID  . atorvastatin  40 mg Oral q1800  . azithromycin  500 mg Oral Daily  . bisoprolol  10 mg Oral Daily  . budesonide (PULMICORT) nebulizer solution  0.25 mg Nebulization BID  . furosemide  20 mg Oral Once  . guaiFENesin  600 mg Oral BID  . ipratropium-albuterol  3 mL Nebulization Q6H  . losartan  12.5 mg Oral Daily  . methylPREDNISolone (SOLU-MEDROL) injection  60 mg Intravenous Q6H     Infusions:   PRN Medications:  LORazepam,  ondansetron **OR** ondansetron (ZOFRAN) IV    Patient Profile   ROGIE MILUM is a 71 y.o. male with a history of HTN, HL, PAD, NICM, HFrEF, Hep C with encephalopathy (11/2015), TB COPD, tob/MJ abuse, PAF, and noncompliance, who was seen for the evaluation of recurrent Afib and dyspnea   Assessment/Plan   1. Chronic systolic CHF: Nonischemic cardiomyopathy.  EF 30-35% on echo this admission.  On exam, he does not look particularly volume overloaded.  - Would continue bisoprolol 10 mg daily, losartan 12.5 daily.  - Can add digoxin 0.125 daily to help with rate control.  - Will use low dose Lasix 20 mg daily.  - Needs BMET today.  2. Atrial fibrillation with RVR: HR still running up into the 100s range.  - Continue Eliquis.  - He is on bisoprolol, will add digoxin.  - Start amiodarone 400 mg bid => think we need to try to keep him in NSR.  Can titrate amiodarone down to low dose (100 mg daily) eventually (aim for lowest possible dose with COPD).  - Would plan TEE-guided DCCV probably this admission if he stays in atrial fibrillation. Discussed with patient and wife.  3. COPD: He is being treated by primary service for AECOPD, on IV Solumedrol.   Length of Stay: 2  Marca Ancona, MD  10/07/2016, 1:16 PM  Advanced Heart Failure Team Pager [phone] (M-F; 7a - 4p)  Please contact [facility] for night-coverage after hours (4p -7a ) and weekends on [email or web address]

## 2016-10-07 NOTE — Progress Notes (Signed)
Patient complaining of shortness of breath and feeling tightness when inhaling. Oxygen saturation remaining above 90 on Room air. Lungs sound congested on assessment. MD notified. Repeat chest x-ray ordered. Will monitor.

## 2016-10-07 NOTE — Progress Notes (Signed)
[facility] - [place] at [facility]   PATIENT NAME: [name]    MR#:  [id number]  DATE OF BIRTH:  11-Jun-1945  SUBJECTIVE:  CHIEF COMPLAINT:   Chief Complaint  Patient presents with  . Shortness of Breath  patient continues to be short of breath and complains of wheezing. And cough.   REVIEW OF SYSTEMS:  Review of Systems  Constitutional: Positive for malaise/fatigue. Negative for chills, fever and weight loss.  HENT: Negative for nosebleeds and sore throat.   Eyes: Negative for blurred vision.  Respiratory: Positive for cough and shortness of breath. Negative for wheezing.   Cardiovascular: Negative for chest pain, orthopnea, leg swelling and PND.  Gastrointestinal: Negative for abdominal pain, constipation, diarrhea, heartburn, nausea and vomiting.  Genitourinary: Negative for dysuria and urgency.  Musculoskeletal: Negative for back pain.  Skin: Negative for rash.  Neurological: Positive for weakness. Negative for dizziness, speech change, focal weakness and headaches.  Endo/Heme/Allergies: Does not bruise/bleed easily.  Psychiatric/Behavioral: Negative for depression.    DRUG ALLERGIES:  No Known Allergies VITALS:  Blood pressure 103/71, pulse 100, temperature 98.2 F (36.8 C), resp. rate 18, height 6' (1.829 m), weight 97 lb 11.2 oz (44.3 kg), SpO2 93 %. PHYSICAL EXAMINATION:  Physical Exam  Constitutional: He is oriented to person, place, and time. He appears malnourished. He appears unhealthy. He appears cachectic.  HENT:  Head: Normocephalic and atraumatic.  Eyes: Pupils are equal, round, and reactive to light. Conjunctivae and EOM are normal.  Neck: Normal range of motion. Neck supple. No tracheal deviation present. No thyromegaly present.  Cardiovascular: Normal rate, regular rhythm and normal heart sounds.   Pulmonary/Chest: Effort normal and breath sounds normal. No respiratory distress. He has no wheezes. He exhibits no tenderness.    Bilateral wheezing throughout both lungs  Abdominal: Soft. Bowel sounds are normal. He exhibits no distension. There is no tenderness.  Musculoskeletal: Normal range of motion.  Right above-knee amputation  Neurological: He is alert and oriented to person, place, and time. No cranial nerve deficit.  Skin: Skin is warm and dry. No rash noted.  Psychiatric: Mood and affect normal.   LABORATORY PANEL:  Male CBC  Recent Labs Lab 10/06/16 0549  WBC 6.5  HGB 12.7*  HCT 38.8*  PLT 110*   ------------------------------------------------------------------------------------------------------------------ Chemistries   Recent Labs Lab 10/06/16 0549 10/07/16 0453  NA 140  --   K 3.4*  --   CL 104  --   CO2 29  --   GLUCOSE 99  --   BUN 23*  --   CREATININE 0.92  --   CALCIUM 8.5*  --   MG  --  1.6*   RADIOLOGY:  Dg Chest Port 1 View  Result Date: 10/07/2016 CLINICAL DATA:  Sudden onset chest pain EXAM: PORTABLE CHEST 1 VIEW COMPARISON:  10/05/2016 FINDINGS: Cardiac shadow is enlarged. Aortic calcifications are again seen. Diffuse chronic calcifications are noted throughout both lungs. Minimal blunting of the costophrenic angles is again seen and stable. No new focal infiltrate is seen. IMPRESSION: Tiny pleural effusions bilaterally. Chronic parenchymal scarring and calcifications. Electronically Signed   By: Alcide Clever M.D.   On: 10/07/2016 07:23   ASSESSMENT AND PLAN:  71 y.o.maleAdmitted with Progressive shortness of breath and palpitations, chest discomfort.   *shortness of breath appears to be more related to acute on chronic COPD exasperation than CHF exasperation I will start patient on nebulizer therapy Solu-Medrol Azithromycin for acute bronchitis Pulmicort nebs   *  Acute on chronic systolic CHF (congestive heart failure) (HCC)  - EF of 15-20% per last echo in April 2017 - currently appears compensated continue to monitor - Started on bisoprolol per cardiology  and low-dose losartan  * Atrial fibrillation with RVR - continue Eliquis  - Bisoprolol for rate control instead of cardizem per cardio  *COPD (chronic obstructive pulmonary disease) (HCC) - continue home meds  *PAD (peripheral artery disease)  *Hepatic cirrhosis (HCC) - avoid nephrotoxins and monitor    * Hypokalemia: Replete and recheck   *hypomagnesemia replace magnesium   *nicotine abuse continues to smoke a few cigarettes here and there according to him strongly recommend he stop smoking completely, offered him a nicotine patch she does not want a nicotine patch- 4 min spent on smoking cessation counseling   All the records are reviewed and case discussed with Care Management/Social Worker. Management plans discussed with the patient, family and they are in agreement.  CODE STATUS: Full Code  TOTAL TIME TAKING CARE OF THIS PATIENT: 35 minutes.   More than 50% of the time was spent in counseling/coordination of care: YES  POSSIBLE D/C IN 2-3 DAYS, DEPENDING ON CLINICAL CONDITION. And cardio eval   Auburn Bilberry M.D on 10/07/2016 at 1:18 PM  Between 7am to 6pm - Pager - [phone]  After 6pm go to [email or web address] - Social research officer, government  [facility] [place] [facility]  Office  [phone]  CC: Primary care physician; Center, [facility]  Note: This dictation was prepared with Dragon dictation along with smaller phrase technology. Any transcriptional errors that result from this process are unintentional.

## 2016-10-08 LAB — CBC
HCT: 39.2 % — ABNORMAL LOW (ref 40.0–52.0)
Hemoglobin: 12.9 g/dL — ABNORMAL LOW (ref 13.0–18.0)
MCH: 31.4 pg (ref 26.0–34.0)
MCHC: 32.9 g/dL (ref 32.0–36.0)
MCV: 95.5 fL (ref 80.0–100.0)
PLATELETS: 110 10*3/uL — AB (ref 150–440)
RBC: 4.11 MIL/uL — ABNORMAL LOW (ref 4.40–5.90)
RDW: 15.3 % — AB (ref 11.5–14.5)
WBC: 6.6 10*3/uL (ref 3.8–10.6)

## 2016-10-08 LAB — BASIC METABOLIC PANEL
Anion gap: 9 (ref 5–15)
BUN: 21 mg/dL — AB (ref 6–20)
CALCIUM: 8.3 mg/dL — AB (ref 8.9–10.3)
CO2: 29 mmol/L (ref 22–32)
CREATININE: 0.82 mg/dL (ref 0.61–1.24)
Chloride: 102 mmol/L (ref 101–111)
GFR calc Af Amer: >60 mL/min (ref >60)
Glucose, Bld: 150 mg/dL — ABNORMAL HIGH (ref 65–99)
POTASSIUM: 3.4 mmol/L — AB (ref 3.5–5.1)
SODIUM: 140 mmol/L (ref 135–145)

## 2016-10-08 LAB — MAGNESIUM: MAGNESIUM: 1.7 mg/dL (ref 1.7–2.4)

## 2016-10-08 MED ORDER — SODIUM CHLORIDE 0.9% FLUSH: 3.0000 mL | INTRAVENOUS | Status: DC | PRN

## 2016-10-08 MED ORDER — SODIUM CHLORIDE 0.9 % IV SOLN
250.0000 mL | INTRAVENOUS | Status: DC
  Administered 2016-10-08: 250 mL via INTRAVENOUS

## 2016-10-08 MED ORDER — IPRATROPIUM-ALBUTEROL 0.5-2.5 (3) MG/3ML IN SOLN
3.0000 mL | Freq: Two times a day (BID) | RESPIRATORY_TRACT | Status: DC
  Administered 2016-10-08 – 2016-10-09 (×2): 3 mL via RESPIRATORY_TRACT
  Filled 2016-10-08 (×2): qty 3

## 2016-10-08 MED ORDER — MAGNESIUM SULFATE 2 GM/50ML IV SOLN
2.0000 g | Freq: Once | INTRAVENOUS | Status: AC
  Administered 2016-10-08: 2 g via INTRAVENOUS
  Filled 2016-10-08: qty 50

## 2016-10-08 MED ORDER — DOCUSATE SODIUM 100 MG PO CAPS
100.0000 mg | ORAL_CAPSULE | Freq: Two times a day (BID) | ORAL | Status: DC
  Administered 2016-10-08 – 2016-10-09 (×2): 100 mg via ORAL
  Filled 2016-10-08 (×2): qty 1

## 2016-10-08 MED ORDER — SODIUM CHLORIDE 0.9% FLUSH
3.0000 mL | Freq: Two times a day (BID) | INTRAVENOUS | Status: DC
  Administered 2016-10-08 – 2016-10-09 (×2): 3 mL via INTRAVENOUS

## 2016-10-08 MED ORDER — POTASSIUM CHLORIDE CRYS ER 20 MEQ PO TBCR
20.0000 meq | EXTENDED_RELEASE_TABLET | Freq: Once | ORAL | Status: AC
  Administered 2016-10-08: 20 meq via ORAL
  Filled 2016-10-08: qty 1

## 2016-10-08 NOTE — Progress Notes (Signed)
[facility] - [place] at [facility]   PATIENT NAME: [name]    MR#:  [id number]  DATE OF BIRTH:  Jun 12, 1945  SUBJECTIVE:  CHIEF COMPLAINT:   Chief Complaint  Patient presents with  . Shortness of Breath  Patient's breathing is much improved continues to be in atrial fibrillation   REVIEW OF SYSTEMS:  Review of Systems  Constitutional: Negative for chills, fever, malaise/fatigue and weight loss.  HENT: Negative for nosebleeds and sore throat.   Eyes: Negative for blurred vision.  Respiratory: Positive for shortness of breath. Negative for cough and wheezing.   Cardiovascular: Negative for chest pain, orthopnea, leg swelling and PND.  Gastrointestinal: Negative for abdominal pain, constipation, diarrhea, heartburn, nausea and vomiting.  Genitourinary: Negative for dysuria and urgency.  Musculoskeletal: Negative for back pain.  Skin: Negative for rash.  Neurological: Negative for dizziness, speech change, focal weakness, weakness and headaches.  Endo/Heme/Allergies: Does not bruise/bleed easily.  Psychiatric/Behavioral: Negative for depression.    DRUG ALLERGIES:  No Known Allergies VITALS:  Blood pressure 95/70, pulse 80, temperature 97.8 F (36.6 C), temperature source Oral, resp. rate 18, height 6' (1.829 m), weight 96 lb 8 oz (43.8 kg), SpO2 96 %. PHYSICAL EXAMINATION:  Physical Exam  Constitutional: He is oriented to person, place, and time. He appears malnourished. He appears unhealthy. He appears cachectic.  HENT:  Head: Normocephalic and atraumatic.  Eyes: Pupils are equal, round, and reactive to light. Conjunctivae and EOM are normal.  Neck: Normal range of motion. Neck supple. No tracheal deviation present. No thyromegaly present.  Cardiovascular: Normal rate, regular rhythm and normal heart sounds.   Pulmonary/Chest: Effort normal and breath sounds normal. No respiratory distress. He has no wheezes. He exhibits no tenderness.  Decreased  breath sounds bilaterally  Abdominal: Soft. Bowel sounds are normal. He exhibits no distension. There is no tenderness.  Musculoskeletal: Normal range of motion.  Right above-knee amputation  Neurological: He is alert and oriented to person, place, and time. No cranial nerve deficit.  Skin: Skin is warm and dry. No rash noted.  Psychiatric: Mood and affect normal.   LABORATORY PANEL:  Male CBC  Recent Labs Lab 10/08/16 0558  WBC 6.6  HGB 12.9*  HCT 39.2*  PLT 110*   ------------------------------------------------------------------------------------------------------------------ Chemistries   Recent Labs Lab 10/08/16 0558  NA 140  K 3.4*  CL 102  CO2 29  GLUCOSE 150*  BUN 21*  CREATININE 0.82  CALCIUM 8.3*  MG 1.7   RADIOLOGY:  No results found. ASSESSMENT AND PLAN:  71 y.o.maleAdmitted with Progressive shortness of breath and palpitations, chest discomfort.   *shortness of breath appears to be more related to acute on chronic COPD exasperation than CHF exasperation Continue nebulizer therapy Solu-Medrol Azithromycin for acute bronchitis Pulmicort nebs   * Acute on chronic systolic CHF (congestive heart failure) (HCC)  - EF of 15-20% per last echo in April 2017 - currently appears compensated continue to monitor - Started on bisoprolol per cardiology and low-dose losartan also on low dose Lasix  * Atrial fibrillation with RVR - continue Eliquis  - Bisoprolol for rate control instead of cardizem per cardio - Plan for cardioversion tomorrow  *PAD (peripheral artery disease)  *Hepatic cirrhosis (HCC) - avoid nephrotoxins and monitor    * Hypokalemia: Replete and recheck   *hypomagnesemia replace magnesium   *nicotine abuse continues to smoke a few cigarettes here and there according to him strongly recommend he stop smoking completely, offered him a nicotine patch  she does not want a nicotine patch- 4 min spent on smoking cessation  counseling   All the records are reviewed and case discussed with Care Management/Social Worker. Management plans discussed with the patient, family and they are in agreement.  CODE STATUS: Full Code  TOTAL TIME TAKING CARE OF THIS PATIENT: 35 minutes.   More than 50% of the time was spent in counseling/coordination of care: YES  POSSIBLE D/C IN 2-3 DAYS, DEPENDING ON CLINICAL CONDITION. And cardio eval   Auburn Bilberry M.D on 10/08/2016 at 1:24 PM  Between 7am to 6pm - Pager - [phone]  After 6pm go to [email or web address] - Social research officer, government  [facility] [place] [facility]  Office  [phone]  CC: Primary care physician; Center, [facility]  Note: This dictation was prepared with Dragon dictation along with smaller phrase technology. Any transcriptional errors that result from this process are unintentional.

## 2016-10-08 NOTE — Progress Notes (Signed)
Patient refusing wound care at this time. RN will attempt in the morning. Will continue to monitor.   Mayra Neer M

## 2016-10-08 NOTE — Progress Notes (Signed)
Patient ID: [name], male   DOB: 02-10-1946, 71 y.o.   MRN: [id number]      Subjective:    Breathing improved, not on oxygen.  HR in 90s, better control today.  Still in atrial fibrillation.  No dyspnea at rest.   Objective:   Weight Range: 96 lb 8 oz (43.8 kg) Body mass index is 13.09 kg/m.   Vital Signs:   Temp:  [97.4 F (36.3 C)-98.2 F (36.8 C)] 97.8 F (36.6 C) (08/12 1144) Pulse Rate:  [19-111] 80 (08/12 1144) Resp:  [16-19] 18 (08/12 1144) BP: (95-111)/(61-78) 95/70 (08/12 1144) SpO2:  [93 %-96 %] 96 % (08/12 1144) Weight:  [96 lb 8 oz (43.8 kg)] 96 lb 8 oz (43.8 kg) (08/12 0338) Last BM Date: 10/05/16  Weight change: Filed Weights   10/06/16 0152 10/07/16 0452 10/08/16 0338  Weight: 95 lb 4.8 oz (43.2 kg) 97 lb 11.2 oz (44.3 kg) 96 lb 8 oz (43.8 kg)    Intake/Output:   Intake/Output Summary (Last 24 hours) at 10/08/16 1209 Last data filed at 10/08/16 1039  Gross per 24 hour  Intake              480 ml  Output             1325 ml  Net             -845 ml      Physical Exam    General: NAD, thin Neck: No JVD, no thyromegaly or thyroid nodule.  Lungs: Distant breath sounds bilaterally.  CV: Nondisplaced PMI.  Heart regular S1/S2, no S3/S4, no murmur.  No peripheral edema.   Abdomen: Soft, nontender, no hepatosplenomegaly, no distention.  Skin: Intact without lesions or rashes.  Neurologic: Alert and oriented x 3.  Psych: Normal affect. Extremities: No clubbing or cyanosis. S/p right AKA.  HEENT: Normal.     Telemetry   Personally reviewed, atrial fibrillation 90s  Labs    CBC  Recent Labs  10/06/16 0549 10/08/16 0558  WBC 6.5 6.6  HGB 12.7* 12.9*  HCT 38.8* 39.2*  MCV 93.7 95.5  PLT 110* 110*   Basic Metabolic Panel  Recent Labs  10/07/16 0453 10/08/16 0558  NA 140 140  K 4.0 3.4*  CL 106 102  CO2 28 29  GLUCOSE 153* 150*  BUN 25* 21*  CREATININE 1.16 0.82  CALCIUM 8.5* 8.3*  MG 1.6* 1.7   Liver Function  Tests No results for input(s): AST, ALT, ALKPHOS, BILITOT, PROT, ALBUMIN in the last 72 hours. No results for input(s): LIPASE, AMYLASE in the last 72 hours. Cardiac Enzymes  Recent Labs  10/05/16 2016 10/05/16 2347  TROPONINI <0.03 <0.03    BNP: BNP (last 3 results)  Recent Labs  10/05/16 2016  BNP 798.0*    ProBNP (last 3 results) No results for input(s): PROBNP in the last 8760 hours.   D-Dimer No results for input(s): DDIMER in the last 72 hours. Hemoglobin A1C No results for input(s): HGBA1C in the last 72 hours. Fasting Lipid Panel No results for input(s): CHOL, HDL, LDLCALC, TRIG, CHOLHDL, LDLDIRECT in the last 72 hours. Thyroid Function Tests  Recent Labs  10/07/16 0453  TSH 2.835    Other results:   Imaging    No results found.   Medications:     Scheduled Medications: . amiodarone  400 mg Oral BID  . apixaban  5 mg Oral BID  . atorvastatin  40 mg Oral q1800  .  azithromycin  500 mg Oral Daily  . bisoprolol  10 mg Oral Daily  . budesonide (PULMICORT) nebulizer solution  0.25 mg Nebulization BID  . digoxin  0.125 mg Oral Daily  . docusate sodium  100 mg Oral BID  . furosemide  20 mg Oral Once  . furosemide  20 mg Oral Daily  . guaiFENesin  600 mg Oral BID  . ipratropium-albuterol  3 mL Nebulization TID  . losartan  12.5 mg Oral Daily  . methylPREDNISolone (SOLU-MEDROL) injection  60 mg Intravenous Q6H  . sodium chloride flush  3 mL Intravenous Q12H    Infusions: . sodium chloride    . magnesium sulfate 1 - 4 g bolus IVPB      PRN Medications: ipratropium-albuterol, LORazepam, ondansetron **OR** ondansetron (ZOFRAN) IV, sodium chloride flush    Patient Profile   NIKHIL OSEI is a 71 y.o. male with a history of HTN, HL, PAD, NICM, HFrEF, Hep C with encephalopathy (11/2015), TB COPD, tob/MJ abuse, PAF, and noncompliance, who was seen for the evaluation of recurrent Afib and dyspnea   Assessment/Plan   1. Chronic systolic CHF:  Nonischemic cardiomyopathy.  EF 30-35% on echo this admission.  On exam, he does not look particularly volume overloaded.  - Would continue bisoprolol 10 mg daily, losartan 12.5 daily.  No titration with soft BP.  - added digoxin 0.125 daily to help with rate control.  - Will use low dose Lasix 20 mg daily.  2. Atrial fibrillation with RVR: Now in 90s, better than yesterday.  - Continue Eliquis.  - He is on bisoprolol and digoxin.  - Started amiodarone 400 mg bid => think we need to try to keep him in NSR.  Can titrate amiodarone down to low dose (100 mg daily) eventually (aim for lowest possible dose with COPD).  - HR now better controlled, so could potentially send him home and have him return for DCCV.  However, he would want to go back to [place] to do the procedure and needs to get re-established there.  Therefore, I am concerned that he may not ever have DCCV if not done this admission (would probably end up back in the hospital with uncontrolled atrial fibrillation).  Would favor TEE-guided DCCV this admission.  Think he is stable for this, now off oxygen and breathing comfortably.  Will make NPO after midnight and arrange for tomorrow. Discussed with patietnt and family, they agree to procedure.   3. COPD: He is being treated by primary service for AECOPD, on IV Solumedrol.   Length of Stay: 3  Marca Ancona, MD  10/08/2016, 12:09 PM  Advanced Heart Failure Team Pager [phone] (M-F; 7a - 4p)  Please contact [facility] for night-coverage after hours (4p -7a ) and weekends on [email or web address]

## 2016-10-09 MED ORDER — APIXABAN 5 MG PO TABS: 5.0000 mg | ORAL_TABLET | Freq: Two times a day (BID) | ORAL | 0 refills | Status: DC

## 2016-10-09 MED ORDER — MIDODRINE HCL 5 MG PO TABS
5.0000 mg | ORAL_TABLET | Freq: Three times a day (TID) | ORAL | Status: DC
  Administered 2016-10-09: 5 mg via ORAL
  Filled 2016-10-09: qty 1

## 2016-10-09 MED ORDER — IPRATROPIUM-ALBUTEROL 20-100 MCG/ACT IN AERS: 1.0000 | INHALATION_SPRAY | Freq: Four times a day (QID) | RESPIRATORY_TRACT | 0 refills | Status: AC

## 2016-10-09 MED ORDER — DIGOXIN 125 MCG PO TABS: 0.1250 mg | ORAL_TABLET | Freq: Every day | ORAL | 0 refills | Status: DC

## 2016-10-09 MED ORDER — AMIODARONE HCL 400 MG PO TABS: 400.0000 mg | ORAL_TABLET | Freq: Two times a day (BID) | ORAL | 0 refills | Status: DC

## 2016-10-09 MED ORDER — MOMETASONE FURO-FORMOTEROL FUM 200-5 MCG/ACT IN AERO: 2.0000 | INHALATION_SPRAY | Freq: Two times a day (BID) | RESPIRATORY_TRACT | 1 refills | Status: AC

## 2016-10-09 MED ORDER — BISOPROLOL FUMARATE 5 MG PO TABS
5.0000 mg | ORAL_TABLET | Freq: Every day | ORAL | Status: DC
  Administered 2016-10-09: 5 mg via ORAL
  Filled 2016-10-09: qty 1

## 2016-10-09 MED ORDER — AZITHROMYCIN 250 MG PO TABS
500.0000 mg | ORAL_TABLET | Freq: Every day | ORAL | Status: DC
  Administered 2016-10-09: 500 mg via ORAL
  Filled 2016-10-09: qty 2

## 2016-10-09 MED ORDER — MIDODRINE HCL 5 MG PO TABS: 5.0000 mg | ORAL_TABLET | Freq: Two times a day (BID) | ORAL | 0 refills | Status: AC

## 2016-10-09 MED ORDER — ZINC OXIDE 40 % EX OINT
TOPICAL_OINTMENT | Freq: Two times a day (BID) | CUTANEOUS | Status: DC
  Administered 2016-10-09: 1 via TOPICAL
  Filled 2016-10-09: qty 114

## 2016-10-09 MED ORDER — ENSURE ENLIVE PO LIQD: 237.0000 mL | Freq: Two times a day (BID) | ORAL | Status: DC

## 2016-10-09 MED ORDER — APIXABAN 5 MG PO TABS: 5.0000 mg | ORAL_TABLET | Freq: Two times a day (BID) | ORAL | 0 refills | Status: AC

## 2016-10-09 MED ORDER — BISOPROLOL FUMARATE 5 MG PO TABS: 5.0000 mg | ORAL_TABLET | Freq: Every day | ORAL | 0 refills | Status: DC

## 2016-10-09 MED ORDER — TIOTROPIUM BROMIDE MONOHYDRATE 18 MCG IN CAPS: 18.0000 ug | ORAL_CAPSULE | Freq: Every morning | RESPIRATORY_TRACT | 12 refills | Status: DC

## 2016-10-09 MED ORDER — ATORVASTATIN CALCIUM 40 MG PO TABS: 40.0000 mg | ORAL_TABLET | Freq: Every day | ORAL | 3 refills | Status: AC

## 2016-10-09 NOTE — Evaluation (Signed)
Physical Therapy Evaluation Patient Details Name: [name] MRN: [id number] DOB: June 24, 1945 Today's Date: 10/09/2016   History of Present Illness  Pt is a 71 y.o. male presenting to hospital with SOB and chest pain x1 week.  Pt admitted to hospital with acute on chronic systolic CHF and a-fib with RVR; also COPD, PAD, and hepatic cirrhosis.  PMH includes R AKA, Hep C, TB, COPD, and htn.  Clinical Impression  Prior to hospital admission, pt was SBA with short ambulation distances with SPC (and use of R LE prosthesis).  Pt lives with his ex-wife in a 1 level home with 3 STE with B railings.  Currently pt is CGA with transfers and ambulation (pt unsteady with SPC and holding onto furniture in room to balance self; improved steadiness noted with use of RW).  HR 83-101 bpm during session.  Pt reporting that his prosthesis is too small and slips off (has been this way for a while and pt has not made an appt to address this) so pt normally holds onto prosthesis while ambulating to keep it secure (PT held onto prosthesis when pt was walking with RW and pt's ex-wife reported she could do the same upon discharge).  Pt lives in a mobile home and pt and pt's ex-wife report that a walker does not fit in the home but things are close enough that pt would have UE support for balance when moving within the home (with use of SPC); pt's ex-wife also reports they have extra assist to get pt up the stairs to enter the home.  Pt would benefit from skilled PT to address noted impairments and functional limitations (see below for any additional details).  Upon hospital discharge, recommend pt discharge to home with 24/7 assist for functional mobility and HHPT.  Pt also encouraged to make an appt to address prosthetic needs (pt reported he knew who to call and just needed to do it)    Follow Up Recommendations Home health PT;Supervision/Assistance - 24 hour    Equipment Recommendations  Rolling walker with 5" wheels     Recommendations for Other Services       Precautions / Restrictions Precautions Precautions: Fall Restrictions Weight Bearing Restrictions: No      Mobility  Bed Mobility Overal bed mobility: Modified Independent             General bed mobility comments: Supine to/from sit with HOB elevated without any difficulties  Transfers Overall transfer level: Needs assistance Equipment used:  (single UE support on L bedrail) Transfers: Sit to/from Stand Sit to Stand: Min guard         General transfer comment: steady with single UE support on bed rail (while donning and using R LE prosthesis for transfers)  Ambulation/Gait Ambulation/Gait assistance: Min guard Ambulation Distance (Feet):  (15 feet with SPC; 60 feet with RW) Assistive device: Straight cane;Rolling walker (2 wheeled)   Gait velocity: decreased   General Gait Details: pt with increased lateral sway and holding onto objects in room for support (also with use of SPC and holding onto prosthesis for support d/t too loose) and requesting to use RW instead; steady with RW but required assist of therapist for prosthesis to maintain secure positioning (d/t being too loose)  Stairs            Wheelchair Mobility    Modified Rankin (Stroke Patients Only)       Balance Overall balance assessment: History of Falls;Needs assistance Sitting-balance support: No upper extremity  supported;Feet supported Sitting balance-Leahy Scale: Good Sitting balance - Comments: sitting reaching within BOS   Standing balance support: Single extremity supported Standing balance-Leahy Scale: Poor Standing balance comment: requires single UE support to maintain balance                             Pertinent Vitals/Pain Pain Assessment: No/denies pain  HR 83-101 bpm during session. O2 sats WFL during session on room air.    Home Living Family/patient expects to be discharged to:: Private residence Living  Arrangements: Non-relatives/Friends (Ex-wife) Available Help at Discharge: Available 24 hours/day (Ex-wife available 24/7) Type of Home: Mobile home Home Access: Stairs to enter Entrance Stairs-Rails: Right;Left;Can reach both Entrance Stairs-Number of Steps: 3 Home Layout: One level Home Equipment: Walker - 2 wheels;Cane - single point Additional Comments: R LE prosthesis    Prior Function Level of Independence: Needs assistance   Gait / Transfers Assistance Needed: Holds onto prosthesis with walking (with one UE) d/t it sliding off (residual limb too small for prosthesis and does not fit well with available ply's)  ADL's / Homemaking Assistance Needed: Ex-wife assists with cooking and cleaning.  Washes up in the tub and then turns on the shower to wash off.  Comments: Ambulates using SPC mostly and R LE prosthesis (occasional use of RW) and usually has someone with him when walking.  Reports h/o falls (bruises as injuries).  Hasn't used w/c in a long time and threw it away (was in bad shape).  Uses motorized scooter at grocery store.     Hand Dominance        Extremity/Trunk Assessment   Upper Extremity Assessment Upper Extremity Assessment: Generalized weakness    Lower Extremity Assessment Lower Extremity Assessment: Generalized weakness (R AKA AROM at least 3/5)    Cervical / Trunk Assessment Cervical / Trunk Assessment: Normal  Communication   Communication: No difficulties  Cognition Arousal/Alertness: Awake/alert Behavior During Therapy: Impulsive Overall Cognitive Status: Within Functional Limits for tasks assessed                                        General Comments General comments (skin integrity, edema, etc.): Pt's ex-wife present during session.  Nursing cleared pt for participation in physical therapy.  Pt agreeable to PT session.    Exercises  Gait training   Assessment/Plan    PT Assessment Patient needs continued PT services  PT  Problem List Decreased strength;Decreased balance;Decreased mobility       PT Treatment Interventions DME instruction;Gait training;Stair training;Functional mobility training;Therapeutic activities;Therapeutic exercise;Balance training;Patient/family education    PT Goals (Current goals can be found in the Care Plan section)  Acute Rehab PT Goals Patient Stated Goal: to go home PT Goal Formulation: With patient Time For Goal Achievement: 10/23/16 Potential to Achieve Goals: Good    Frequency Min 2X/week   Barriers to discharge        Co-evaluation               AM-PAC PT "6 Clicks" Daily Activity  Outcome Measure Difficulty turning over in bed (including adjusting bedclothes, sheets and blankets)?: None Difficulty moving from lying on back to sitting on the side of the bed? : None Difficulty sitting down on and standing up from a chair with arms (e.g., wheelchair, bedside commode, etc,.)?: A Little Help needed moving to and  from a bed to chair (including a wheelchair)?: A Little Help needed walking in hospital room?: A Little Help needed climbing 3-5 steps with a railing? : A Little 6 Click Score: 20    End of Session Equipment Utilized During Treatment: Gait belt;Other (comment) (R LE prosthesis) Activity Tolerance: Patient tolerated treatment well Patient left: in bed;with call bell/phone within reach;with bed alarm set;with family/visitor present Nurse Communication: Mobility status;Precautions PT Visit Diagnosis: Unsteadiness on feet (R26.81);Muscle weakness (generalized) (M62.81);History of falling (Z91.81);Difficulty in walking, not elsewhere classified (R26.2)    Time: 1540-1620 PT Time Calculation (min) (ACUTE ONLY): 40 min   Charges:   PT Evaluation $PT Eval Low Complexity: 1 Low PT Treatments $Therapeutic Activity: 8-22 mins   PT G CodesHendricks Limes, PT 10/09/16, 5:36 PM [phone]

## 2016-10-09 NOTE — Progress Notes (Signed)
Nutrition Follow-up  DOCUMENTATION CODES:   Severe malnutrition in context of chronic illness  INTERVENTION:  1. Continue Ensure Enlive po BID, each supplement provides 350 kcal and 20 grams of protein  NUTRITION DIAGNOSIS:   Malnutrition (Severe) related to chronic illness (CHF, COPD) as evidenced by severe depletion of muscle mass, severe depletion of body fat, percent weight loss. -ongoing  GOAL:   Patient will meet greater than or equal to 90% of their needs -meeting  MONITOR:   PO intake, I & O's, Labs, Supplement acceptance, Weight trends  REASON FOR ASSESSMENT:   Malnutrition Screening Tool    ASSESSMENT:   [name] is a 71 year old male with a PMH of COPD, Cirrhosis, GERD, Anxiety, HTN, PVD, Cirrhosis, s/p R AKA in 2000 presents with shortness of breath, palpitations and chest discomfort found to be in A. Fib with RVR and acute on chronic CHF  CHF and COPD exacerbation resolving. Was unable to undergo TEE/DCCV today. Really wants to go home, may d/c tomorrow per cardiology. PO intake has been good, no complaints - had a hamburger, yogurt, coleslaw for dinner yesterday - 713 calories and 33 grams of protein.  Ate an omelet, toast, breakfast potatoes and oatmeal for lunch today. - 694 calories and 28 grams of protein Meal Completion: 100%  Weight up to 98 lbs following his dip to 95lbs on 8/10 UOP last 24 hrs. 2L fluid negative  Medications reviewed and include:  Colace, Solumedrol  Labs reviewed:  K 3.4  Diet Order:  Diet Heart Room service appropriate? Yes; Fluid consistency: Thin  Skin:  Wound (see comment) (Stg II to sacrum)  Last BM:  10/04/2016  Height:   Ht Readings from Last 1 Encounters:  10/05/16 6' (1.829 m)    Weight:   Wt Readings from Last 1 Encounters:  10/09/16 98 lb 3.2 oz (44.5 kg)    Ideal Body Weight:  74.4 kg  BMI:  Body mass index is 13.32 kg/m.  Estimated Nutritional Needs:   Kcal:  1300-1500 calories (30-35  cal/kg)  Protein:  65-76 grams (1.5-1.75g/kg)  Fluid:  >/= 1.5L  EDUCATION NEEDS:   No education needs identified at this time  Dionne Ano. Shaunae Sieloff, MS, RD LDN Inpatient Clinical Dietitian Pager [phone]

## 2016-10-09 NOTE — Progress Notes (Signed)
IV and tele removed from patient. Discharge instructions given to patient along with hard copy prescriptions. Verbalized understanding. Family is at bedside and will transport patient home.

## 2016-10-09 NOTE — Care Management (Signed)
Physical therapy has evaluated and did recommend home health but at present time, patient does not have a physician.  He does not wish to get established with a PCP in [place] because he does not have part B medicare. He wants to go back to [place].  He is going later this week or next week.  Was able to obtain patient's eliquis, lipitor, digoxin, amiodarone from [facility].  The inhalers were not filled and was informed that inhalers are not provided anymore.  Patient must have PCP and agency would provide assist completing patient assistance applications for the specific drug company.  Pharmacist Scott completed a [facility] to home referral.  Completed MATCH referral for Midodrine and the Combivent and informed this would not take patient over the financial limit.  Reached out to [facility] administrators via email in the event that an over ride is needed. An appointment has been made for an eligibility appointment at [facility].  Strongly encourage patient to keep this appointment.  Provided application.  Patient is not eligible for [facility] because he has Medicare A.  CM again discussed the need to sign up for Part B and D coverage regardless of "the penalties".  Stressed many time of the need to go to the [place] and get reconnected.

## 2016-10-09 NOTE — Progress Notes (Signed)
Progress Note  Patient Name: [name] Date of Encounter: 10/09/2016  Primary Cardiologist: [facility]  Subjective   No chest pain or dyspnea.  Rates have been stable in 90's, though he has not been out of bed at all.  TEE/DCCV could not be scheduled for this AM.  He isn't sure that he wants it here as he is eager to go home and promises to f/u @ the [place].  Inpatient Medications    Scheduled Meds: . amiodarone  400 mg Oral BID  . apixaban  5 mg Oral BID  . atorvastatin  40 mg Oral q1800  . azithromycin  500 mg Oral Daily  . bisoprolol  5 mg Oral Daily  . budesonide (PULMICORT) nebulizer solution  0.25 mg Nebulization BID  . digoxin  0.125 mg Oral Daily  . docusate sodium  100 mg Oral BID  . furosemide  20 mg Oral Once  . guaiFENesin  600 mg Oral BID  . ipratropium-albuterol  3 mL Nebulization BID  . liver oil-zinc oxide   Topical BID  . losartan  12.5 mg Oral Daily  . methylPREDNISolone (SOLU-MEDROL) injection  60 mg Intravenous Q6H  . midodrine  5 mg Oral TID WC  . sodium chloride flush  3 mL Intravenous Q12H   Continuous Infusions: . sodium chloride 250 mL (10/08/16 1227)   PRN Meds: ipratropium-albuterol, LORazepam, ondansetron **OR** ondansetron (ZOFRAN) IV, sodium chloride flush   Vital Signs    Vitals:   10/09/16 0118 10/09/16 0446 10/09/16 0740 10/09/16 0800  BP: (!) 85/63 (!) 82/61  90/63  Pulse: (!) 109 74  99  Resp: 20 18  18   Temp:  (!) 97.5 F (36.4 C)  97.6 F (36.4 C)  TempSrc:  Oral  Oral  SpO2: 96% 98% 94% 94%  Weight:  98 lb 3.2 oz (44.5 kg)    Height:        Intake/Output Summary (Last 24 hours) at 10/09/16 1107 Last data filed at 10/09/16 0601  Gross per 24 hour  Intake              240 ml  Output              400 ml  Net             -160 ml   Filed Weights   10/07/16 0452 10/08/16 0338 10/09/16 0446  Weight: 97 lb 11.2 oz (44.3 kg) 96 lb 8 oz (43.8 kg) 98 lb 3.2 oz (44.5 kg)    Physical Exam   GEN: frail,  cachectic, in no acute distress.  HEENT: Grossly normal.  Neck: Supple, no JVD, carotid bruits, or masses. Cardiac: IR, IR, no murmurs, rubs, or gallops. No clubbing, cyanosis, edema.  Radials/DP/PT 2+ and equal bilaterally.  Respiratory:  Respirations regular and unlabored, clear to auscultation bilaterally. GI: Soft, nontender, nondistended, BS + x 4. MS: no deformity or atrophy. Skin: warm and dry, no rash. Neuro:  Strength and sensation are intact. Psych: AAOx3.  Normal affect.  Labs    Chemistry Recent Labs Lab 10/06/16 0549 10/07/16 0453 10/08/16 0558  NA 140 140 140  K 3.4* 4.0 3.4*  CL 104 106 102  CO2 29 28 29   GLUCOSE 99 153* 150*  BUN 23* 25* 21*  CREATININE 0.92 1.16 0.82  CALCIUM 8.5* 8.5* 8.3*  GFRNONAA >60 >60 >60  GFRAA >60 >60 >60  ANIONGAP 7 6 9      Hematology Recent Labs  Lab 10/05/16 2016 10/06/16 0549 10/08/16 0558  WBC 5.8 6.5 6.6  RBC 4.28* 4.13* 4.11*  HGB 13.1 12.7* 12.9*  HCT 39.8* 38.8* 39.2*  MCV 93.0 93.7 95.5  MCH 30.7 30.7 31.4  MCHC 33.0 32.7 32.9  RDW 15.3* 15.4* 15.3*  PLT 109* 110* 110*    Cardiac Enzymes Recent Labs Lab 10/05/16 2016 10/05/16 2347  TROPONINI <0.03 <0.03      BNP Recent Labs Lab 10/05/16 2016  BNP 798.0*     Radiology    No results found.  Telemetry    Afib 90's - Personally Reviewed  Cardiac Studies   2D Echocardiogram 8.10.2018  Study Conclusions   - Procedure narrative: Transthoracic echocardiography. The study   was technically difficult. - Left ventricle: The cavity size was mildly dilated. Systolic   function was moderately to severely reduced. The estimated   ejection fraction was in the range of 30% to 35%. Diffuse   hypokinesis. - Mitral valve: There was moderate regurgitation. - Left atrium: The atrium was mildly dilated. - Right ventricle: The cavity size was normal. Wall thickness was   mildly increased. Systolic function was mildly reduced. - Tricuspid valve: There  was moderate regurgitation. - Pulmonary arteries: Systolic pressure was mildly to moderately   increased. PA peak pressure: 45 mm Hg (S).   Patient Profile     71 y.o. 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  Assessment & Plan    1. Afib RVR:  Rates relatively stable in the 90's on PO amio,  blocker, and digoxin.  He has been NPO but we were unable to get him scheduled for TEE/DCCV this AM.  [facility] is [facility] but we remain concerned that he may not be able to f/u in short order, may not be able to obtain meds, and may end up readmitted due to rapid rates, dyspnea, etc.  He understands our concerns.  I discussed his case with our case manager and she has indicated that he can obtain his medications for one month through the med mgmt clinic.  That would give him some time to establish care back at the [place] in [place], obtain future meds, and arrange cardiology f/u.  He will be seen by PT today.  If HR's stable with ambulation and he is otw felt to be stable for d/c, he will need [facility] within [facility] - or we can see him back.  Cont amio 400 bid x 7 days and then drop to 200 BID until f/u with cardiology.  Cont eliquis,  blocker, digoxin @ current doses.  2.  HFrEF/NICM:  EF 30-35%.  Euvolemic.  Cont  blocker, ARB as tolerated - blood pressures 80's to 90's.  Low dose PO lasix added yesterday.  May need to d/c ARB if pressures remain soft.  3.  COPD:  Steroids/inhalers/nebs per IM.  4.  Tob/MJ abuse:  Cessation advised.  5.  Deconditioning:  PT to see today.  Signed, Nicolasa Ducking, NP  10/09/2016, 11:07 AM

## 2016-10-09 NOTE — Discharge Instructions (Signed)
Heart Failure Clinic appointment on October 13 2016 at 9:00am with Clarisa Kindred, FNP. Please call [phone] to reschedule.  [facility] [place] [facility]  DIET:  Cardiac diet  DISCHARGE CONDITION:  Stable  ACTIVITY:  Activity as tolerated  OXYGEN:  Home Oxygen: No.   Oxygen Delivery: room air  DISCHARGE LOCATION:  home    ADDITIONAL DISCHARGE INSTRUCTION:   If you experience worsening of your admission symptoms, develop shortness of breath, life threatening emergency, suicidal or homicidal thoughts you must seek medical attention immediately by calling 911 or calling your MD immediately  if symptoms less severe.  You Must read complete instructions/literature along with all the possible adverse reactions/side effects for all the Medicines you take and that have been prescribed to you. Take any new Medicines after you have completely understood and accpet all the possible adverse reactions/side effects.   Please note  You were cared for by a hospitalist during your hospital stay. If you have any questions about your discharge medications or the care you received while you were in the hospital after you are discharged, you can call the unit and asked to speak with the hospitalist on call if the hospitalist that took care of you is not available. Once you are discharged, your primary care physician will handle any further medical issues. Please note that NO REFILLS for any discharge medications will be authorized once you are discharged, as it is imperative that you return to your primary care physician (or establish a relationship with a primary care physician if you do not have one) for your aftercare needs so that they can reassess your need for medications and monitor your lab values.

## 2016-10-09 NOTE — Discharge Summary (Signed)
[facility], 71 y.o., DOB 01-31-1946, MRN [id number]. Admission date: 10/05/2016 Discharge Date 10/09/2016 Primary MD [facility] Admitting Physician Oralia Manis, MD  Admission Diagnosis  Shortness of breath [R06.02] Atrial fibrillation with rapid ventricular response (HCC) [I48.91] Acute on chronic systolic CHF (congestive heart failure) (HCC) [I50.23]  Discharge Diagnosis   Principal Problem:  Acute respiratory distress  Acute on chronic COPD exasperation  Acute systolic CHF exasperation  A. fib with RVR Peripheral arterial disease History liver cirrhosis medical noncompliance Hypotension       Hospital Course  [name] a 71 y.o.malewho presents with Progressive shortness of breath and palpitations with chest discomfort. Patient was admitted for further evaluation initially thought to have acute on chronic systolic CHF however patient did not put out much urine with IV Lasix. Patient has not taken any of his prescribed medications from the past. He states that he could not afford these. He is a [place] patient however states that did not have a way to get to the [place]. Patient was admitted to the hospital was seen by cardiology. He was treated for COPD exasperation with resolution of his symptoms. He was noted to be in A. fib with RVR was seen by cardiology who adjusted his medications. They debated about cardioversion. Currently his heart rate stable. Patient is stable to be discharged with outpatient follow-up. He is getting assistance from case manager for 1 month of home medication. He is strongly recommended to go to the [place] for continued care.          Consults  cardiology  Significant Tests:  See full reports for all details    Dg Chest 2 View  Result Date: 10/05/2016 CLINICAL DATA:  Shortness of breath and COPD.  Chest pain. EXAM: CHEST  2 VIEW COMPARISON:  Chest radiograph 11/28/2015 FINDINGS:  There is cardiomegaly with calcific aortic atherosclerosis. There are multiple dense opacities again seen within the left upper lobe and right mid lung. No focal airspace consolidation. There is bibasilar atelectasis. Small bilateral pleural effusions. IMPRESSION: Small bilateral pleural effusions and associated atelectasis. Otherwise, unchanged appearance of bilateral chronic densities without active cardiopulmonary disease. Electronically Signed   By: Deatra Robinson M.D.   On: 10/05/2016 20:50   Dg Chest Port 1 View  Result Date: 10/07/2016 CLINICAL DATA:  Sudden onset chest pain EXAM: PORTABLE CHEST 1 VIEW COMPARISON:  10/05/2016 FINDINGS: Cardiac shadow is enlarged. Aortic calcifications are again seen. Diffuse chronic calcifications are noted throughout both lungs. Minimal blunting of the costophrenic angles is again seen and stable. No new focal infiltrate is seen. IMPRESSION: Tiny pleural effusions bilaterally. Chronic parenchymal scarring and calcifications. Electronically Signed   By: Alcide Clever M.D.   On: 10/07/2016 07:23       Today   Subjective:   [name]  Breathing much better states that he is weak  Objective:   Blood pressure 110/85, pulse 92, temperature 97.9 F (36.6 C), temperature source Oral, resp. rate 18, height 6' (1.829 m), weight 98 lb 3.2 oz (44.5 kg), SpO2 96 %.  .  Intake/Output Summary (Last 24 hours) at 10/09/16 1525 Last data filed at 10/09/16 1355  Gross per 24 hour  Intake              480 ml  Output              600 ml  Net             -  120 ml    Exam VITAL SIGNS: Blood pressure 110/85, pulse 92, temperature 97.9 F (36.6 C), temperature source Oral, resp. rate 18, height 6' (1.829 m), weight 98 lb 3.2 oz (44.5 kg), SpO2 96 %.  GENERAL:  71 y.o.-year-old patient lying in the bed with no acute distress.  EYES: Pupils equal, round, reactive to light and accommodation. No scleral icterus. Extraocular muscles intact.  HEENT: Head atraumatic,  normocephalic. Oropharynx and nasopharynx clear.  NECK:  Supple, no jugular venous distention. No thyroid enlargement, no tenderness.  LUNGS: Normal breath sounds bilaterally, no wheezing, rales,rhonchi or crepitation. No use of accessory muscles of respiration.  CARDIOVASCULAR: Irregularly irregular. No murmurs, rubs, or gallops.  ABDOMEN: Soft, nontender, nondistended. Bowel sounds present. No organomegaly or mass.  EXTREMITIES: No pedal edema, cyanosis, or clubbing.  NEUROLOGIC: Cranial nerves II through XII are intact. Muscle strength 5/5 in all extremities. Sensation intact. Gait not checked.  PSYCHIATRIC: The patient is alert and oriented x 3.  SKIN: No obvious rash, lesion, or ulcer.   Data Review     CBC w Diff:  Lab Results  Component Value Date   WBC 6.6 10/08/2016   HGB 12.9 (L) 10/08/2016   HCT 39.2 (L) 10/08/2016   PLT 110 (L) 10/08/2016   LYMPHOPCT 37 11/28/2015   MONOPCT 14 11/28/2015   EOSPCT 4 11/28/2015   BASOPCT 1 11/28/2015   CMP:  Lab Results  Component Value Date   NA 140 10/08/2016   K 3.4 (L) 10/08/2016   CL 102 10/08/2016   CO2 29 10/08/2016   BUN 21 (H) 10/08/2016   CREATININE 0.82 10/08/2016   PROT 7.1 12/01/2015   ALBUMIN 3.4 (L) 12/01/2015   BILITOT 1.4 (H) 12/01/2015   ALKPHOS 64 12/01/2015   AST 82 (H) 12/01/2015   ALT 80 (H) 12/01/2015  .  Micro Results No results found for this or any previous visit (from the past 240 hour(s)).      Code Status Orders        Start     Ordered   10/06/16 0151  Full code  Continuous     10/06/16 0150    Code Status History    Date Active Date Inactive Code Status Order ID Comments User Context   11/29/2015  4:15 AM 12/04/2015 12:35 AM Full Code [id number]  Fuller Plan, MD ED   06/23/2015  9:44 AM 06/28/2015  9:33 PM Full Code [id number]  Courtney Paris, MD Inpatient          Follow-up Information    [facility] Follow up on 10/13/2016.    Specialty:  Cardiology Why:  at 9:00am  Contact information: [address] [place] [address] [phone]       [facility] Follow up in 1 week(s).   Contact information: [address] [place] [address] [phone]        Phil Dopp, MD Follow up in 1 week(s).   Specialty:  Dermatopathology Contact information: [address] [place] [address] [phone]           Discharge Medications   Allergies as of 10/09/2016   No Known Allergies     Medication List    STOP taking these medications   aspirin 325 MG tablet     TAKE these medications   amiodarone 400 MG tablet Commonly known as:  PACERONE Take 1 tablet (400 mg total) by mouth 2 (  two) times daily.   apixaban 5 MG Tabs tablet Commonly known as:  ELIQUIS Take 1 tablet (5 mg total) by mouth 2 (two) times daily.   atorvastatin 40 MG tablet Commonly known as:  LIPITOR Take 1 tablet (40 mg total) by mouth daily at 6 PM.   bisoprolol 5 MG tablet Commonly known as:  ZEBETA Take 1 tablet (5 mg total) by mouth daily.   digoxin 0.125 MG tablet Commonly known as:  LANOXIN Take 1 tablet (0.125 mg total) by mouth daily.   feeding supplement Liqd Take 1 Container by mouth 3 (three) times daily between meals.   Ipratropium-Albuterol 20-100 MCG/ACT Aers respimat Commonly known as:  COMBIVENT RESPIMAT Inhale 1 puff into the lungs every 6 (six) hours.   lactulose 10 GM/15ML solution Commonly known as:  CHRONULAC TAKE BY MOUTH THREE TIMES DAILY *TITRATE TO THREE BOWEL MOVEMENTS DAILY*   midodrine 5 MG tablet Commonly known as:  PROAMATINE Take 1 tablet (5 mg total) by mouth 2 (two) times daily at 10 AM and 5 PM.   mometasone-formoterol 200-5 MCG/ACT Aero Commonly known as:  DULERA Inhale 2 puffs into the lungs 2 (two) times daily.   tiotropium 18 MCG inhalation capsule Commonly known as:  SPIRIVA HANDIHALER Place 1 capsule (18 mcg  total) into inhaler and inhale every morning.          Total Time in preparing paper work, data evaluation and todays exam - 35 minutes  Auburn Bilberry M.D on 10/09/2016 at 3:25 PM  [facility]   Office  [phone]

## 2016-10-09 NOTE — Care Management (Signed)
Informed that patient could be discharge home today.  At present, there is not a discharge order present.  Patient was to have had a cardioversion today but "it is not on the schedule."  have [facility]  but unable to fill the Midodrine.  Will need to complete a MATCH for that medication.  Patient is now complaining of his prosthesis being too large as he has lost weight.  Discussed that this is not acute - would need to have this followed at [facility].  Attempted to make another appointment for patient at [facility] and again informed that there are no additional appointments. Patient has not been ambulated over the weekend - other than to transfer to [facility].  PT consult is pending

## 2016-10-09 NOTE — Progress Notes (Signed)
PHARMACIST - PHYSICIAN COMMUNICATION DR:   Patel CONCERNING: Antibiotic IV to Oral Route Change Policy  RECOMMENDATION: This patient is receiving azithromycin by the intravenous route.  Based on criteria approved by the Pharmacy and Therapeutics Committee, the antibiotic(s) is/are being converted to the equivalent oral dose form(s).   DESCRIPTION: These criteria include:  Patient being treated for a respiratory tract infection, urinary tract infection, cellulitis or clostridium difficile associated diarrhea if on metronidazole  The patient is not neutropenic and does not exhibit a GI malabsorption state  The patient is eating (either orally or via tube) and/or has been taking other orally administered medications for a least 24 hours  The patient is improving clinically and has a Tmax < 100.5  If you have questions about this conversion, please contact the Pharmacy Department  []  ( [phone] )  [place] [x]  ( [phone] )  [facility] []  ( [phone] )  [place] []  ( [phone] )  [facility] []  ( [phone] )  [facility]   Isebella Upshur, PharmD Clinical Pharmacist  

## 2016-10-13 ENCOUNTER — Telehealth: Payer: Self-pay | Admitting: Family

## 2016-10-13 ENCOUNTER — Ambulatory Visit: Payer: Medicare Other | Admitting: Family

## 2016-10-13 NOTE — Telephone Encounter (Signed)
Patient missed his initial appointment at the [facility] Clinic on 10/13/16. Will attempt to reschedule.

## 2016-10-17 ENCOUNTER — Ambulatory Visit: Payer: Medicare Other | Admitting: Pharmacy Technician

## 2016-10-17 ENCOUNTER — Encounter: Payer: Medicare Other | Admitting: Pharmacist

## 2016-10-17 NOTE — Progress Notes (Signed)
Patient scheduled for eligibility appointment at [facility].  Patient did not show for the appointment on October 17, 2016 at 9:00a.m. for an MTM and at 10:30a.m. for eligibility.  Patient did not reschedule MTM or eligibility appointment.  [facility] unable to provide additional medication assistance until eligibility is determined.  Sherilyn Dacosta Care Manager [facility]

## 2017-04-03 ENCOUNTER — Inpatient Hospital Stay: Payer: Medicare Other

## 2017-04-03 ENCOUNTER — Encounter: Payer: Self-pay | Admitting: *Deleted

## 2017-04-03 ENCOUNTER — Other Ambulatory Visit: Payer: Self-pay

## 2017-04-03 ENCOUNTER — Inpatient Hospital Stay
Admission: EM | Admit: 2017-04-03 | Discharge: 2017-04-06 | DRG: 689 | Disposition: A | Discharge status: Skilled Nursing Facility | Payer: Medicare Other | Attending: Internal Medicine | Admitting: Internal Medicine

## 2017-04-03 DIAGNOSIS — M4854XA Collapsed vertebra, not elsewhere classified, thoracic region, initial encounter for fracture: Secondary | ICD-10-CM | POA: Diagnosis present

## 2017-04-03 DIAGNOSIS — I2510 Atherosclerotic heart disease of native coronary artery without angina pectoris: Secondary | ICD-10-CM | POA: Diagnosis present

## 2017-04-03 DIAGNOSIS — R531 Weakness: Secondary | ICD-10-CM

## 2017-04-03 DIAGNOSIS — R001 Bradycardia, unspecified: Secondary | ICD-10-CM | POA: Diagnosis present

## 2017-04-03 DIAGNOSIS — N3000 Acute cystitis without hematuria: Secondary | ICD-10-CM | POA: Diagnosis present

## 2017-04-03 DIAGNOSIS — I5022 Chronic systolic (congestive) heart failure: Secondary | ICD-10-CM | POA: Diagnosis present

## 2017-04-03 DIAGNOSIS — N400 Enlarged prostate without lower urinary tract symptoms: Secondary | ICD-10-CM | POA: Diagnosis present

## 2017-04-03 DIAGNOSIS — N390 Urinary tract infection, site not specified: Secondary | ICD-10-CM

## 2017-04-03 DIAGNOSIS — Z89611 Acquired absence of right leg above knee: Secondary | ICD-10-CM

## 2017-04-03 DIAGNOSIS — K7460 Unspecified cirrhosis of liver: Secondary | ICD-10-CM | POA: Diagnosis present

## 2017-04-03 DIAGNOSIS — I110 Hypertensive heart disease with heart failure: Secondary | ICD-10-CM | POA: Diagnosis present

## 2017-04-03 DIAGNOSIS — E43 Unspecified severe protein-calorie malnutrition: Secondary | ICD-10-CM | POA: Diagnosis present

## 2017-04-03 DIAGNOSIS — R52 Pain, unspecified: Secondary | ICD-10-CM

## 2017-04-03 DIAGNOSIS — E785 Hyperlipidemia, unspecified: Secondary | ICD-10-CM | POA: Diagnosis present

## 2017-04-03 DIAGNOSIS — B961 Klebsiella pneumoniae [K. pneumoniae] as the cause of diseases classified elsewhere: Secondary | ICD-10-CM | POA: Diagnosis present

## 2017-04-03 DIAGNOSIS — J431 Panlobular emphysema: Secondary | ICD-10-CM | POA: Diagnosis present

## 2017-04-03 DIAGNOSIS — I951 Orthostatic hypotension: Secondary | ICD-10-CM | POA: Diagnosis present

## 2017-04-03 DIAGNOSIS — E7800 Pure hypercholesterolemia, unspecified: Secondary | ICD-10-CM | POA: Diagnosis present

## 2017-04-03 DIAGNOSIS — I428 Other cardiomyopathies: Secondary | ICD-10-CM | POA: Diagnosis present

## 2017-04-03 DIAGNOSIS — I480 Paroxysmal atrial fibrillation: Secondary | ICD-10-CM | POA: Diagnosis present

## 2017-04-03 DIAGNOSIS — A045 Campylobacter enteritis: Secondary | ICD-10-CM | POA: Diagnosis present

## 2017-04-03 DIAGNOSIS — Z9181 History of falling: Secondary | ICD-10-CM

## 2017-04-03 DIAGNOSIS — R627 Adult failure to thrive: Secondary | ICD-10-CM | POA: Diagnosis present

## 2017-04-03 DIAGNOSIS — K219 Gastro-esophageal reflux disease without esophagitis: Secondary | ICD-10-CM | POA: Diagnosis present

## 2017-04-03 DIAGNOSIS — N3001 Acute cystitis with hematuria: Principal | ICD-10-CM | POA: Diagnosis present

## 2017-04-03 DIAGNOSIS — F17210 Nicotine dependence, cigarettes, uncomplicated: Secondary | ICD-10-CM | POA: Diagnosis present

## 2017-04-03 DIAGNOSIS — L89301 Pressure ulcer of unspecified buttock, stage 1: Secondary | ICD-10-CM | POA: Diagnosis present

## 2017-04-03 DIAGNOSIS — Z7951 Long term (current) use of inhaled steroids: Secondary | ICD-10-CM

## 2017-04-03 DIAGNOSIS — Z7901 Long term (current) use of anticoagulants: Secondary | ICD-10-CM

## 2017-04-03 DIAGNOSIS — Z681 Body mass index (BMI) 19 or less, adult: Secondary | ICD-10-CM | POA: Diagnosis not present

## 2017-04-03 DIAGNOSIS — I739 Peripheral vascular disease, unspecified: Secondary | ICD-10-CM | POA: Diagnosis present

## 2017-04-03 LAB — CBC WITH DIFFERENTIAL/PLATELET
BASOS PCT: 0 %
Basophils Absolute: 0 10*3/uL (ref 0–0.1)
EOS ABS: 0 10*3/uL (ref 0–0.7)
Eosinophils Relative: 0 %
HEMATOCRIT: 36.1 % — AB (ref 40.0–52.0)
HEMOGLOBIN: 12.1 g/dL — AB (ref 13.0–18.0)
Lymphocytes Relative: 9 %
Lymphs Abs: 0.8 10*3/uL — ABNORMAL LOW (ref 1.0–3.6)
MCH: 30.7 pg (ref 26.0–34.0)
MCHC: 33.5 g/dL (ref 32.0–36.0)
MCV: 91.6 fL (ref 80.0–100.0)
Monocytes Absolute: 0.6 10*3/uL (ref 0.2–1.0)
Monocytes Relative: 7 %
NEUTROS ABS: 7.4 10*3/uL — AB (ref 1.4–6.5)
NEUTROS PCT: 84 %
Platelets: 214 10*3/uL (ref 150–440)
RBC: 3.94 MIL/uL — AB (ref 4.40–5.90)
RDW: 14.9 % — ABNORMAL HIGH (ref 11.5–14.5)
WBC: 8.9 10*3/uL (ref 3.8–10.6)

## 2017-04-03 LAB — COMPREHENSIVE METABOLIC PANEL
ALK PHOS: 96 U/L (ref 38–126)
ALT: 45 U/L (ref 17–63)
ANION GAP: 6 (ref 5–15)
AST: 51 U/L — AB (ref 15–41)
Albumin: 2.3 g/dL — ABNORMAL LOW (ref 3.5–5.0)
BILIRUBIN TOTAL: 0.7 mg/dL (ref 0.3–1.2)
BUN: 17 mg/dL (ref 6–20)
CALCIUM: 8 mg/dL — AB (ref 8.9–10.3)
CO2: 31 mmol/L (ref 22–32)
Chloride: 97 mmol/L — ABNORMAL LOW (ref 101–111)
Creatinine, Ser: 0.87 mg/dL (ref 0.61–1.24)
GFR calc Af Amer: >60 mL/min (ref >60)
GFR calc non Af Amer: >60 mL/min (ref >60)
Glucose, Bld: 98 mg/dL (ref 65–99)
Potassium: 4 mmol/L (ref 3.5–5.1)
SODIUM: 134 mmol/L — AB (ref 135–145)
Total Protein: 6.7 g/dL (ref 6.5–8.1)

## 2017-04-03 LAB — URINALYSIS, COMPLETE (UACMP) WITH MICROSCOPIC
BILIRUBIN URINE: NEGATIVE
Glucose, UA: NEGATIVE mg/dL
Ketones, ur: NEGATIVE mg/dL
NITRITE: POSITIVE — AB
PROTEIN: NEGATIVE mg/dL
SQUAMOUS EPITHELIAL / LPF: NONE SEEN
Specific Gravity, Urine: 1.014 (ref 1.005–1.030)
pH: 7 (ref 5.0–8.0)

## 2017-04-03 LAB — TROPONIN I: Troponin I: <0.03 ng/mL (ref <0.03)

## 2017-04-03 LAB — DIGOXIN LEVEL: DIGOXIN LVL: 1.3 ng/mL (ref 0.8–2.0)

## 2017-04-03 MED ORDER — MIDODRINE HCL 5 MG PO TABS
5.0000 mg | ORAL_TABLET | Freq: Two times a day (BID) | ORAL | Status: DC
  Administered 2017-04-04 – 2017-04-06 (×5): 5 mg via ORAL
  Filled 2017-04-03 (×5): qty 1

## 2017-04-03 MED ORDER — DIGOXIN 125 MCG PO TABS
0.1250 mg | ORAL_TABLET | Freq: Every day | ORAL | Status: DC
  Filled 2017-04-03: qty 1

## 2017-04-03 MED ORDER — AMIODARONE HCL 200 MG PO TABS
200.0000 mg | ORAL_TABLET | Freq: Every day | ORAL | Status: DC
  Administered 2017-04-04: 200 mg via ORAL
  Filled 2017-04-03 (×2): qty 1

## 2017-04-03 MED ORDER — TIOTROPIUM BROMIDE MONOHYDRATE 18 MCG IN CAPS
18.0000 ug | ORAL_CAPSULE | Freq: Every morning | RESPIRATORY_TRACT | Status: DC
  Administered 2017-04-04 – 2017-04-06 (×3): 18 ug via RESPIRATORY_TRACT
  Filled 2017-04-03: qty 5

## 2017-04-03 MED ORDER — MOMETASONE FURO-FORMOTEROL FUM 200-5 MCG/ACT IN AERO
2.0000 | INHALATION_SPRAY | Freq: Two times a day (BID) | RESPIRATORY_TRACT | Status: DC
  Administered 2017-04-04 – 2017-04-06 (×4): 2 via RESPIRATORY_TRACT
  Filled 2017-04-03: qty 8.8

## 2017-04-03 MED ORDER — APIXABAN 5 MG PO TABS
5.0000 mg | ORAL_TABLET | Freq: Two times a day (BID) | ORAL | Status: DC
  Administered 2017-04-03 – 2017-04-04 (×2): 5 mg via ORAL
  Filled 2017-04-03 (×2): qty 1

## 2017-04-03 MED ORDER — IPRATROPIUM-ALBUTEROL 0.5-2.5 (3) MG/3ML IN SOLN
3.0000 mL | Freq: Four times a day (QID) | RESPIRATORY_TRACT | Status: DC
  Administered 2017-04-03 – 2017-04-04 (×2): 3 mL via RESPIRATORY_TRACT
  Filled 2017-04-03 (×2): qty 3

## 2017-04-03 MED ORDER — ATORVASTATIN CALCIUM 20 MG PO TABS
40.0000 mg | ORAL_TABLET | Freq: Every day | ORAL | Status: DC
  Administered 2017-04-04 – 2017-04-05 (×2): 40 mg via ORAL
  Filled 2017-04-03 (×2): qty 2

## 2017-04-03 MED ORDER — FINASTERIDE 5 MG PO TABS
5.0000 mg | ORAL_TABLET | Freq: Every day | ORAL | Status: DC
  Administered 2017-04-04 – 2017-04-06 (×3): 5 mg via ORAL
  Filled 2017-04-03 (×3): qty 1

## 2017-04-03 MED ORDER — TAMSULOSIN HCL 0.4 MG PO CAPS
0.4000 mg | ORAL_CAPSULE | Freq: Every day | ORAL | Status: DC
  Administered 2017-04-04 – 2017-04-05 (×2): 0.4 mg via ORAL
  Filled 2017-04-03 (×2): qty 1

## 2017-04-03 MED ORDER — DEXTROSE 5 % IV SOLN
1.0000 g | INTRAVENOUS | Status: DC
  Administered 2017-04-03 – 2017-04-05 (×3): 1 g via INTRAVENOUS
  Filled 2017-04-03 (×4): qty 10

## 2017-04-03 NOTE — ED Notes (Signed)
Pt brought in via ems from home.  Pt reports diarrhea x 7 today.  No abd pain.  No vomiting.  Pt states his balls hurt.  No urinary sx.  Pt has dry stool on buttocks and scrotum.  Red area at base of spine x 3 months.  Denies chest pain or sob.  Pt alert.  Speech clear.  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

## 2017-04-03 NOTE — ED Triage Notes (Signed)
Pt brought in via ems from home.  Pt reports no appetite, recent weight loss.  No chest pain or sob.  Pt alert.  Speech clear.

## 2017-04-03 NOTE — H&P (Addendum)
[facility] [place] [facility]   PATIENT NAME: [name]    MR#:  [id number]  DATE OF BIRTH:  January 29, 1946  DATE OF ADMISSION:  04/03/2017  PRIMARY CARE PHYSICIAN: [facility]   REQUESTING/REFERRING PHYSICIAN: Dr Tobe Sos  CHIEF COMPLAINT:   Chief Complaint  Patient presents with  . Failure To Thrive    HISTORY OF PRESENT ILLNESS:  Chosen [name]  is a 72 y.o. male presents after a fall 3 weeks ago.  He has been having back pain and been unable to walk since.  He states that the [place] is supposed to fifth him for new prosthesis because it keeps on falling off he has to hold it on in order to walk.  He lives with his ex-wife.  He states that he has a sore on his buttock.  He urinates very little at a time and some burning on urination.  Patient was found to have a positive urine analysis.  Some shortness of breath and some cough.  Positive for diarrhea 6 times today.  ER physician tried to transfer him to the [place] but the [place] was on diversion.  Hospitalist were contacted for evaluation.  PAST MEDICAL HISTORY:   Past Medical History:  Diagnosis Date  . Anxiety   . Arthritis    "hands" (11/29/2015)  . Chronic systolic CHF (congestive heart failure) (HCC)    a. 05/2015 Echo: EF 15-20%, diff HK.  Marland Kitchen Cirrhosis of liver (HCC)   . COPD (chronic obstructive pulmonary disease) (HCC)   . GERD (gastroesophageal reflux disease)   . Headache    "weekly, if that" (11/29/2015)  . 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 Hepatitis C   . High cholesterol   . History of blood transfusion 2000  . Hypertension   . Kidney stones    "alot"  . Marijuana abuse   . NICM (nonischemic cardiomyopathy) (HCC)    a. 05/2015 Echo: EF 15-20%, diff HK, mod MR, sev dil LA, mild TR, PASP ;  b. 05/2015 R/LHC: LM nl, LAD 30d, LCX nl, RCA 40ost, 48m, EF 20%.  . Non-obstructive CAD (coronary artery disease)    a. 05/2015 R/LHC: LM nl, LAD 30d, LCX nl, RCA 40ost,  52m, EF 20%.  . Paroxysmal atrial fibrillation (HCC)    a. 05/2015 - converted on amio. Also placed on Eliquis (CHA2DS2VASc = 3), but never took b/c he couldn't afford and didn't f/u @ [place].  Marland Kitchen Pneumonia 05/2015  . 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  . Tobacco abuse   . Tuberculosis 2007    PAST SURGICAL HISTORY:   Past Surgical History:  Procedure Laterality Date  . ABOVE KNEE LEG AMPUTATION Right 2000  . CARDIAC CATHETERIZATION N/A 06/25/2015   Procedure: Right/Left Heart Cath and Coronary Angiography;  Surgeon: Dolores Patty, MD;  Location: [facility] INVASIVE CV LAB;  Service: Cardiovascular;  Laterality: N/A;  . CATARACT EXTRACTION W/ INTRAOCULAR LENS  IMPLANT, BILATERAL Bilateral ~ 2003  . EXTRACORPOREAL SHOCK WAVE LITHOTRIPSY      SOCIAL HISTORY:   Social History   Tobacco Use  . Smoking status: Former Smoker    Years: 50.00    Types: Cigarettes  . Smokeless tobacco: Never Used  . Tobacco comment: used to smoke 2ppd but for past 10 yrs has been smoking 1 or less/day - usually just a few puffs off of someone else's cigarette  Substance Use Topics  .  Alcohol use: No    Comment: used to drink heavily - quit in 2000    FAMILY HISTORY:   Family History  Problem Relation Age of Onset  . Dementia Mother     DRUG ALLERGIES:  No Known Allergies  REVIEW OF SYSTEMS:  CONSTITUTIONAL: No fever, positive for chills and sweats.  Positive for weight loss but states is been stable for a while.  Positive for fatigue.  EYES: No blurred or double vision.  EARS, NOSE, AND THROAT: No tinnitus or ear pain. No sore throat.  Positive for runny nose.  Decreased hearing left ear. RESPIRATORY: Some cough and shortness of breath, no wheezing or hemoptysis.  CARDIOVASCULAR: No chest pain, orthopnea, edema.  GASTROINTESTINAL: No nausea, vomiting, or abdominal pain. No blood in bowel movements.  Positive for diarrhea 6 times today GENITOURINARY: No  dysuria, hematuria.  ENDOCRINE: No polyuria, nocturia,  HEMATOLOGY: No anemia SKIN: Sore on buttock MUSCULOSKELETAL: Positive for low back pain NEUROLOGIC: No tingling, numbness, weakness.  PSYCHIATRY: Some anxiety.   MEDICATIONS AT HOME:   Prior to Admission medications   Medication Sig Start Date End Date Taking? Authorizing Provider  amiodarone (PACERONE) 400 MG tablet Take 1 tablet (400 mg total) by mouth 2 (two) times daily. 10/09/16   Auburn Bilberry, MD  apixaban (ELIQUIS) 5 MG TABS tablet Take 1 tablet (5 mg total) by mouth 2 (two) times daily. 10/09/16   Auburn Bilberry, MD  atorvastatin (LIPITOR) 40 MG tablet Take 1 tablet (40 mg total) by mouth daily at 6 PM. 10/09/16   Auburn Bilberry, MD  bisoprolol (ZEBETA) 5 MG tablet Take 1 tablet (5 mg total) by mouth daily. 10/10/16   Auburn Bilberry, MD  digoxin (LANOXIN) 0.125 MG tablet Take 1 tablet (0.125 mg total) by mouth daily. 10/10/16   Auburn Bilberry, MD  Ipratropium-Albuterol (COMBIVENT RESPIMAT) 20-100 MCG/ACT AERS respimat Inhale 1 puff into the lungs every 6 (six) hours. 10/09/16   Auburn Bilberry, MD  midodrine (PROAMATINE) 5 MG tablet Take 1 tablet (5 mg total) by mouth 2 (two) times daily at 10 AM and 5 PM. 10/09/16   Auburn Bilberry, MD  mometasone-formoterol [facility]) 200-5 MCG/ACT AERO Inhale 2 puffs into the lungs 2 (two) times daily. 10/09/16   Auburn Bilberry, MD  tiotropium (SPIRIVA HANDIHALER) 18 MCG inhalation capsule Place 1 capsule (18 mcg total) into inhaler and inhale every morning. 10/09/16   Auburn Bilberry, MD      VITAL SIGNS:  Blood pressure (!) 131/54, pulse (!) 53, temperature 97.6 F (36.4 C), temperature source Oral, resp. rate (!) 24, height 6' (1.829 m), weight 50.3 kg (111 lb), SpO2 98 %.  PHYSICAL EXAMINATION:  GENERAL:  72 y.o.-year-old patient lying in the bed with no acute distress.  Cachectic EYES: Pupils equal, round, reactive to light and accommodation. No scleral icterus. Extraocular muscles  intact.  HEENT: Head atraumatic, normocephalic. Oropharynx and nasopharynx clear.  NECK:  Supple, no jugular venous distention. No thyroid enlargement, no tenderness.  LUNGS: Decreased breath sounds bilaterally, slight expiratory wheezing, no rales,rhonchi or crepitation. No use of accessory muscles of respiration.  CARDIOVASCULAR: S1, S2  bradycardic. No murmurs, rubs, or gallops.  ABDOMEN: Soft, nontender, nondistended. Bowel sounds present. No organomegaly or mass.  EXTREMITIES: No pedal edema, cyanosis, or clubbing.  Right leg amputation.  Patient having pain over the sacral area NEUROLOGIC: Cranial nerves II through XII are intact. Muscle strength 5/5 in all extremities. Sensation intact. Gait not checked.  PSYCHIATRIC: The patient is alert and oriented  x 3.  SKIN: Some redness in the sacral area  LABORATORY PANEL:   CBC Recent Labs  Lab 04/03/17 1646  WBC 8.9  HGB 12.1*  HCT 36.1*  PLT 214   ------------------------------------------------------------------------------------------------------------------  Chemistries  Recent Labs  Lab 04/03/17 1646  NA 134*  K 4.0  CL 97*  CO2 31  GLUCOSE 98  BUN 17  CREATININE 0.87  CALCIUM 8.0*  AST 51*  ALT 45  ALKPHOS 96  BILITOT 0.7   ------------------------------------------------------------------------------------------------------------------  Cardiac Enzymes Recent Labs  Lab 04/03/17 1646  TROPONINI <0.03   ------------------------------------------------------------------------------------------------------------------  RADIOLOGY:  X-rays ordered by me  EKG:   Ordered by me  IMPRESSION AND PLAN:   1.  Acute cystitis with hematuria.  Patient also urinating very little at a time and has BPH.  Continue Flomax and add finasteride.  I ordered a urine culture stat.  Start Rocephin. 2.  Low back pain and unable to walk.  Start with x-rays of the sacrum and lumbar spine.  Patient having some pain over the sacral  area.  Someone will have to bring his prosthesis N.  Physical therapy evaluation 3.  Atrial fibrillation history with bradycardia.  Patient on Eliquis.  Hold  zebeta.  Continue amiodarone at 200 mg daily.  Check a digoxin level in order for digoxin for tomorrow if level okay. 4.  Hyperlipidemia unspecified on Lipitor 5.  COPD with wheeze continue nebulizer treatments and inhalers 6.  History of orthostatic hypotension on midodrine 7.  Diarrhea.  Order stool studies 8.  Stage I decubiti with erythema on the buttock.  Duodenum ordered  All the records are reviewed and case discussed with ED provider. Management plans discussed with the patient, family and they are in agreement.  CODE STATUS: Full code  TOTAL TIME TAKING CARE OF THIS PATIENT: 50 minutes.    Alford Highland M.D on 04/03/2017 at 8:01 PM  Between 7am to 6pm - Pager - [phone]  After 6pm call admission pager [phone]  [facility] Office  [phone]  CC: Primary care physician; [facility]

## 2017-04-03 NOTE — ED Notes (Signed)
Report called to [facility] rn floor nurse

## 2017-04-03 NOTE — ED Provider Notes (Signed)
[facility] Emergency Department Provider Note   ____________________________________________   I have reviewed the triage vital signs and the nursing notes.   HISTORY  Chief Complaint Can't walk  History limited by: Not Limited   HPI [name] is a 72 y.o. male who presents to the emergency department today because he was told to come to the emergency department for transfer to the [place]. He states that he called the [place] today with the chief complaint that he cannot walk. This has been present for at least one month. There does not appear to be anything new today that prompted the patient to call. His decreased ability to ambulate apparently started after he had a fall a few months ago. The patient denies any chest pain or shortness of breath. Denies any fevers.   Per medical record review patient has a history of cirrhosis.  Past Medical History:  Diagnosis Date  . Anxiety   . Arthritis    "hands" (11/29/2015)  . Chronic systolic CHF (congestive heart failure) (HCC)    a. 05/2015 Echo: EF 15-20%, diff HK.  Marland Kitchen Cirrhosis of liver (HCC)   . COPD (chronic obstructive pulmonary disease) (HCC)   . GERD (gastroesophageal reflux disease)   . Headache    "weekly, if that" (11/29/2015)  . 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 Hepatitis C   . High cholesterol   . History of blood transfusion 2000  . Hypertension   . Kidney stones    "alot"  . Marijuana abuse   . NICM (nonischemic cardiomyopathy) (HCC)    a. 05/2015 Echo: EF 15-20%, diff HK, mod MR, sev dil LA, mild TR, PASP ;  b. 05/2015 R/LHC: LM nl, LAD 30d, LCX nl, RCA 40ost, 1m, EF 20%.  . Non-obstructive CAD (coronary artery disease)    a. 05/2015 R/LHC: LM nl, LAD 30d, LCX nl, RCA 40ost, 18m, EF 20%.  . Paroxysmal atrial fibrillation (HCC)    a. 05/2015 - converted on amio. Also placed on Eliquis (CHA2DS2VASc = 3), but never took b/c he couldn't afford and didn't f/u @ [place].  Marland Kitchen Pneumonia 05/2015   . 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  . Tobacco abuse   . Tuberculosis 2007    Patient Active Problem List   Diagnosis Date Noted  . GERD (gastroesophageal reflux disease) 10/05/2016  . Hepatic cirrhosis (HCC) 12/02/2015  . Pressure injury of skin 12/01/2015  . COPD (chronic obstructive pulmonary disease) (HCC) 12/01/2015  . PAD (peripheral artery disease) (HCC) 12/01/2015  . Encephalopathy 11/28/2015  . Lingular pneumonia   . Panlobular emphysema (HCC)   . Hepatitis C antibody test positive   . TB (tuberculosis), treated   . Acute on chronic systolic CHF (congestive heart failure) (HCC)   . Pressure ulcer 06/24/2015  . Protein-calorie malnutrition, severe 06/24/2015  . Atrial fibrillation with RVR (HCC) 06/23/2015    Past Surgical History:  Procedure Laterality Date  . ABOVE KNEE LEG AMPUTATION Right 2000  . CARDIAC CATHETERIZATION N/A 06/25/2015   Procedure: Right/Left Heart Cath and Coronary Angiography;  Surgeon: Dolores Patty, MD;  Location: [facility] INVASIVE CV LAB;  Service: Cardiovascular;  Laterality: N/A;  . CATARACT EXTRACTION W/ INTRAOCULAR LENS  IMPLANT, BILATERAL Bilateral ~ 2003  . EXTRACORPOREAL SHOCK WAVE LITHOTRIPSY      Prior to Admission medications   Medication Sig Start Date End Date Taking? Authorizing Provider  amiodarone (PACERONE) 400 MG tablet Take 1  tablet (400 mg total) by mouth 2 (two) times daily. 10/09/16   Auburn Bilberry, MD  apixaban (ELIQUIS) 5 MG TABS tablet Take 1 tablet (5 mg total) by mouth 2 (two) times daily. 10/09/16   Auburn Bilberry, MD  atorvastatin (LIPITOR) 40 MG tablet Take 1 tablet (40 mg total) by mouth daily at 6 PM. 10/09/16   Auburn Bilberry, MD  bisoprolol (ZEBETA) 5 MG tablet Take 1 tablet (5 mg total) by mouth daily. 10/10/16   Auburn Bilberry, MD  digoxin (LANOXIN) 0.125 MG tablet Take 1 tablet (0.125 mg total) by mouth daily. 10/10/16   Auburn Bilberry, MD  feeding supplement  (BOOST / RESOURCE BREEZE) LIQD Take 1 Container by mouth 3 (three) times daily between meals. Patient not taking: Reported on 10/05/2016 12/03/15   Valentino Nose, MD  Ipratropium-Albuterol (COMBIVENT RESPIMAT) 20-100 MCG/ACT AERS respimat Inhale 1 puff into the lungs every 6 (six) hours. 10/09/16   Auburn Bilberry, MD  lactulose (CHRONULAC) 10 GM/15ML solution TAKE BY MOUTH THREE TIMES DAILY *TITRATE TO THREE BOWEL MOVEMENTS DAILY* Patient not taking: Reported on 10/05/2016 12/08/15   Valentino Nose, MD  midodrine (PROAMATINE) 5 MG tablet Take 1 tablet (5 mg total) by mouth 2 (two) times daily at 10 AM and 5 PM. 10/09/16   Auburn Bilberry, MD  mometasone-formoterol [facility]) 200-5 MCG/ACT AERO Inhale 2 puffs into the lungs 2 (two) times daily. 10/09/16   Auburn Bilberry, MD  tiotropium (SPIRIVA HANDIHALER) 18 MCG inhalation capsule Place 1 capsule (18 mcg total) into inhaler and inhale every morning. 10/09/16   Auburn Bilberry, MD    Allergies Patient has no known allergies.  Family History  Problem Relation Age of Onset  . Dementia Mother     Social History Social History   Tobacco Use  . Smoking status: Former Smoker    Years: 50.00    Types: Cigarettes  . Smokeless tobacco: Never Used  . Tobacco comment: used to smoke 2ppd but for past 10 yrs has been smoking 1 or less/day - usually just a few puffs off of someone else's cigarette  Substance Use Topics  . Alcohol use: No    Comment: used to drink heavily - quit in 2000  . Drug use: Yes    Types: Marijuana    Comment: occasional marijuana cigarette    Review of Systems Constitutional: No fever/chills Eyes: No visual changes. ENT: No sore throat. Cardiovascular: Denies chest pain. Respiratory: Denies shortness of breath. Gastrointestinal: No abdominal pain.  No nausea, no vomiting.  No diarrhea.   Genitourinary: Negative for dysuria. Musculoskeletal: Positive for low back pain. Skin: Negative for rash. Neurological:  Negative for headaches, focal weakness or numbness.  ____________________________________________   PHYSICAL EXAM:  VITAL SIGNS: ED Triage Vitals  Enc Vitals Group     BP 04/03/17 1641 (!) 117/59     Pulse Rate 04/03/17 1641 (!) 55     Resp 04/03/17 1641 16     Temp 04/03/17 1641 97.6 F (36.4 C)     Temp Source 04/03/17 1641 Oral     SpO2 04/03/17 1641 98 %     Weight 04/03/17 1639 111 lb (50.3 kg)     Height 04/03/17 1639 6' (1.829 m)     Head Circumference --      Peak Flow --      Pain Score 04/03/17 1639 5   Constitutional: Alert and oriented. Cachectic. No acute distress. Eyes: Conjunctivae are normal.  ENT   Head: Normocephalic and atraumatic.  Nose: No congestion/rhinnorhea.   Mouth/Throat: Mucous membranes are moist.   Neck: No stridor. Hematological/Lymphatic/Immunilogical: No cervical lymphadenopathy. Cardiovascular: Normal rate, regular rhythm.  No murmurs, rubs, or gallops.  Respiratory: Normal respiratory effort without tachypnea nor retractions. Breath sounds are clear and equal bilaterally. No wheezes/rales/rhonchi. Gastrointestinal: Soft and non tender. No rebound. No guarding.  Genitourinary: Deferred Musculoskeletal: Normal range of motion in all extremities. No lower extremity edema. Neurologic:  Normal speech and language. No gross focal neurologic deficits are appreciated.  Skin:  Skin is warm, dry and intact. No rash noted. Psychiatric: Mood and affect are normal. Speech and behavior are normal. Patient exhibits appropriate insight and judgment.  ____________________________________________    LABS (pertinent positives/negatives)  Trop <0.03 CBC wbc 8.9, hgb 12.1, plt 214 CMP na 134, glu 98, cr 0.87, ca 8.0 UA positive nitrite, large leukocytes, too numerous to count WBCs  ____________________________________________   EKG  I, Phineas Semen, attending physician, personally viewed and interpreted this EKG  EKG Time:  1651 Rate: 56 Rhythm: sinus bradycardia Axis: normal Intervals: qtc 487 QRS: q waves V1 ST changes: no st elevation Impression: abnormal ekg   ____________________________________________    RADIOLOGY  None  ____________________________________________   PROCEDURES  Procedures  ____________________________________________   INITIAL IMPRESSION / ASSESSMENT AND PLAN / ED COURSE  Pertinent labs & imaging results that were available during my care of the patient were reviewed by me and considered in my medical decision making (see chart for details).  Patient presented to the emergency department today because of concerns for profound weakness and inability to ambulate.  Differential would be broad including malnutrition, anemia, infection amongst other etiologies. Work up is consistent with urinary tract infection. Given inability to perform ADLs with this weakness will plan on admission. Patient is [facility], will send paperwork.  ____________________________________________   FINAL CLINICAL IMPRESSION(S) / ED DIAGNOSES  Final diagnoses:  Weakness  Lower urinary tract infection     Note: This dictation was prepared with Dragon dictation. Any transcriptional errors that result from this process are unintentional     Phineas Semen, MD 04/03/17 [phone]

## 2017-04-03 NOTE — ED Notes (Signed)
primedoc in with pt for admission.  

## 2017-04-03 NOTE — ED Notes (Signed)
Pt alert.  Iv in place.  Sinus brady on monitor.  No chest pain or sob.  Pt waiting for admission.

## 2017-04-03 NOTE — ED Notes (Signed)
Patient transported to 145 

## 2017-04-04 ENCOUNTER — Inpatient Hospital Stay: Payer: Medicare Other

## 2017-04-04 LAB — GASTROINTESTINAL PANEL BY PCR, STOOL (REPLACES STOOL CULTURE)
ADENOVIRUS F40/41: NOT DETECTED
ASTROVIRUS: NOT DETECTED
CAMPYLOBACTER SPECIES: DETECTED — AB
CRYPTOSPORIDIUM: NOT DETECTED
Cyclospora cayetanensis: NOT DETECTED
ENTEROAGGREGATIVE E COLI (EAEC): NOT DETECTED
ENTEROPATHOGENIC E COLI (EPEC): NOT DETECTED
ENTEROTOXIGENIC E COLI (ETEC): NOT DETECTED
Entamoeba histolytica: NOT DETECTED
GIARDIA LAMBLIA: NOT DETECTED
Norovirus GI/GII: NOT DETECTED
PLESIMONAS SHIGELLOIDES: NOT DETECTED
ROTAVIRUS A: NOT DETECTED
Salmonella species: NOT DETECTED
Sapovirus (I, II, IV, and V): NOT DETECTED
Shiga like toxin producing E coli (STEC): NOT DETECTED
Shigella/Enteroinvasive E coli (EIEC): NOT DETECTED
VIBRIO SPECIES: NOT DETECTED
Vibrio cholerae: NOT DETECTED
YERSINIA ENTEROCOLITICA: NOT DETECTED

## 2017-04-04 LAB — C DIFFICILE QUICK SCREEN W PCR REFLEX
C DIFFICILE (CDIFF) TOXIN: NEGATIVE
C Diff antigen: NEGATIVE
C Diff interpretation: NOT DETECTED

## 2017-04-04 MED ORDER — ADULT MULTIVITAMIN W/MINERALS CH
1.0000 | ORAL_TABLET | Freq: Every day | ORAL | Status: DC
  Administered 2017-04-05 – 2017-04-06 (×2): 1 via ORAL
  Filled 2017-04-04 (×2): qty 1

## 2017-04-04 MED ORDER — BOOST / RESOURCE BREEZE PO LIQD CUSTOM
1.0000 | Freq: Three times a day (TID) | ORAL | Status: DC
  Administered 2017-04-05 (×2): 1 via ORAL

## 2017-04-04 MED ORDER — OXYCODONE-ACETAMINOPHEN 5-325 MG PO TABS
1.0000 | ORAL_TABLET | ORAL | Status: DC | PRN
  Administered 2017-04-04 – 2017-04-06 (×6): 1 via ORAL
  Filled 2017-04-04 (×6): qty 1

## 2017-04-04 MED ORDER — ACETAMINOPHEN 325 MG PO TABS: 650.0000 mg | ORAL_TABLET | Freq: Four times a day (QID) | ORAL | Status: DC | PRN

## 2017-04-04 MED ORDER — DIGOXIN 0.0625 MG HALF TABLET
0.0625 mg | ORAL_TABLET | Freq: Every day | ORAL | Status: DC
  Filled 2017-04-04 (×2): qty 1

## 2017-04-04 MED ORDER — IPRATROPIUM-ALBUTEROL 0.5-2.5 (3) MG/3ML IN SOLN
3.0000 mL | Freq: Four times a day (QID) | RESPIRATORY_TRACT | Status: DC | PRN
  Filled 2017-04-04: qty 3

## 2017-04-04 MED ORDER — VITAMIN C 500 MG PO TABS
250.0000 mg | ORAL_TABLET | Freq: Two times a day (BID) | ORAL | Status: DC
  Administered 2017-04-04 – 2017-04-06 (×5): 250 mg via ORAL
  Filled 2017-04-04 (×6): qty 0.5

## 2017-04-04 MED ORDER — AZITHROMYCIN 500 MG PO TABS
500.0000 mg | ORAL_TABLET | Freq: Every day | ORAL | Status: AC
  Administered 2017-04-04 – 2017-04-06 (×3): 500 mg via ORAL
  Filled 2017-04-04 (×3): qty 1

## 2017-04-04 NOTE — Progress Notes (Signed)
Pt in bed resting. Complaints of lower back pain. No orders for pain medications. MD Elisabeth Pigeon paged. MD ordered oxycodone. PRN oxycodone given to pt. Will follow up. No other complaints from pt at this time.

## 2017-04-04 NOTE — NC FL2 (Signed)
[place] MEDICAID FL2 LEVEL OF CARE SCREENING TOOL     IDENTIFICATION  Patient Name: [name] Birthdate: 1945-08-23 Sex: male Admission Date (Current Location): 04/03/2017  [place] and [place] Number:  Chiropodist and Address:  [facility], [address] [place], [place] [address]      Provider Number: [id number]  Attending Physician Name and Address:  Altamese Dilling, *  Relative Name and Phone Number:       Current Level of Care: Hospital Recommended Level of Care: Skilled Nursing Facility Prior Approval Number:    Date Approved/Denied:   PASRR Number: [id number]  Discharge Plan: SNF    Current Diagnoses: Patient Active Problem List   Diagnosis Date Noted  . Acute cystitis 04/03/2017  . GERD (gastroesophageal reflux disease) 10/05/2016  . Hepatic cirrhosis (HCC) 12/02/2015  . Pressure injury of skin 12/01/2015  . COPD (chronic obstructive pulmonary disease) (HCC) 12/01/2015  . PAD (peripheral artery disease) (HCC) 12/01/2015  . Encephalopathy 11/28/2015  . Lingular pneumonia   . Panlobular emphysema (HCC)   . Hepatitis C antibody test positive   . TB (tuberculosis), treated   . Acute on chronic systolic CHF (congestive heart failure) (HCC)   . Pressure ulcer 06/24/2015  . Protein-calorie malnutrition, severe 06/24/2015  . Atrial fibrillation with RVR (HCC) 06/23/2015    Orientation RESPIRATION BLADDER Height & Weight     Self, Time, Situation, Place  Normal Continent Weight: 98 lb 9.6 oz (44.7 kg) Height:  6' (182.9 cm)  BEHAVIORAL SYMPTOMS/MOOD NEUROLOGICAL BOWEL NUTRITION STATUS  (none) (none) Continent Diet(regular)  AMBULATORY STATUS COMMUNICATION OF NEEDS Skin   Extensive Assist Verbally Normal, PU Stage and Appropriate Care                       Personal Care Assistance Level of Assistance  Dressing, Bathing Bathing Assistance: Limited assistance   Dressing Assistance: Limited  assistance     Functional Limitations Info  (no issues)          SPECIAL CARE FACTORS FREQUENCY  PT (By licensed PT)                    Contractures Contractures Info: Not present    Additional Factors Info  Code Status, Allergies Code Status Info: full Allergies Info: nka           Current Medications (04/04/2017):  This is the current hospital active medication list Current Facility-Administered Medications  Medication Dose Route Frequency Provider Last Rate Last Dose  . amiodarone (PACERONE) tablet 200 mg  200 mg Oral Daily Alford Highland, MD   200 mg at 04/04/17 1040  . atorvastatin (LIPITOR) tablet 40 mg  40 mg Oral q1800 Wieting, Richard, MD      . azithromycin [facility]) tablet 500 mg  500 mg Oral Daily Pyreddy, Pavan, MD   500 mg at 04/04/17 1039  . cefTRIAXone (ROCEPHIN) 1 g in dextrose 5 % 50 mL IVPB  1 g Intravenous Q24H Alford Highland, MD   Stopped at 04/04/17 0144  . [START ON 04/05/2017] digoxin (LANOXIN) tablet 0.0625 mg  0.0625 mg Oral Daily Altamese Dilling, MD      . feeding supplement (BOOST / RESOURCE BREEZE) liquid 1 Container  1 Container Oral TID BM Altamese Dilling, MD      . finasteride (PROSCAR) tablet 5 mg  5 mg Oral Daily Alford Highland, MD   5 mg at 04/04/17 1040  . ipratropium-albuterol (DUONEB) 0.5-2.5 (3)  MG/3ML nebulizer solution 3 mL  3 mL Inhalation Q6H PRN Altamese Dilling, MD      . midodrine (PROAMATINE) tablet 5 mg  5 mg Oral BID Alford Highland, MD   5 mg at 04/04/17 1039  . mometasone-formoterol (DULERA) 200-5 MCG/ACT inhaler 2 puff  2 puff Inhalation BID Alford Highland, MD   2 puff at 04/04/17 1043  . [START ON 04/05/2017] multivitamin with minerals tablet 1 tablet  1 tablet Oral Daily Altamese Dilling, MD      . tamsulosin (FLOMAX) capsule 0.4 mg  0.4 mg Oral QPC supper Wieting, Richard, MD      . tiotropium [facility] inhalation capsule 18 mcg  18 mcg Inhalation q morning - 10a Alford Highland, MD    18 mcg at 04/04/17 1045  . vitamin C (ASCORBIC ACID) tablet 250 mg  250 mg Oral BID Altamese Dilling, MD         Discharge Medications: Please see discharge summary for a list of discharge medications.  Relevant Imaging Results:  Relevant Lab Results:   Additional Information ss: [id number]  York Spaniel, LCSW

## 2017-04-04 NOTE — Progress Notes (Signed)
[facility] [place] at [facility]   PATIENT NAME: [name]    MR#:  [id number]  DATE OF BIRTH:  [birth year]  SUBJECTIVE:  CHIEF COMPLAINT:   Chief Complaint  Patient presents with  . Failure To Thrive   Came with progressive worsening in functional status, for last 3-4 months. Losing weight. Decreased appetite. His orthostasis on his leg is not fitting properly now because of shrinking the stump. He had a fall a few weeks ago and last 1 week he is very weak and not able to get up or move by himself. So brought to the emergency room. Noted to have UTI. Patient denies any new complaints today. REVIEW OF SYSTEMS:  CONSTITUTIONAL: No fever, positive for fatigue or weakness.  EYES: No blurred or double vision.  EARS, NOSE, AND THROAT: No tinnitus or ear pain.  RESPIRATORY: No cough, shortness of breath, wheezing or hemoptysis.  CARDIOVASCULAR: No chest pain, orthopnea, edema.  GASTROINTESTINAL: No nausea, vomiting, diarrhea or abdominal pain.  GENITOURINARY: No dysuria, hematuria.  ENDOCRINE: No polyuria, nocturia,  HEMATOLOGY: No anemia, easy bruising or bleeding SKIN: No rash or lesion. MUSCULOSKELETAL: No joint pain or arthritis.   NEUROLOGIC: No tingling, numbness, weakness.  PSYCHIATRY: No anxiety or depression.   ROS  DRUG ALLERGIES:  No Known Allergies  VITALS:  Blood pressure (!) 101/41, pulse (!) 59, temperature 98.5 F (36.9 C), temperature source Oral, resp. rate 16, height 6' (1.829 m), weight 44.7 kg (98 lb 9.6 oz), SpO2 92 %.  PHYSICAL EXAMINATION:  GENERAL:  72 y.o.-year-old patient lying in the bed with no acute distress.  EYES: Pupils equal, round, reactive to light and accommodation. No scleral icterus. Extraocular muscles intact.  HEENT: Head atraumatic, normocephalic. Oropharynx and nasopharynx clear.  NECK:  Supple, no jugular venous distention. No thyroid enlargement, no tenderness.  LUNGS: Normal breath sounds bilaterally, no  wheezing, rales,rhonchi or crepitation. No use of accessory muscles of respiration.  CARDIOVASCULAR: S1, S2 normal. No murmurs, rubs, or gallops.  ABDOMEN: Soft, nontender, nondistended. Bowel sounds present. No organomegaly or mass.  EXTREMITIES: No pedal edema, cyanosis, or clubbing. Right-sided amputation. NEUROLOGIC: Cranial nerves II through XII are intact. Muscle strength 4-5/5 in all extremities. Sensation intact. Gait not checked.  PSYCHIATRIC: The patient is alert and oriented x 3.  SKIN: No obvious rash, lesion, or ulcer.   Physical Exam LABORATORY PANEL:   CBC Recent Labs  Lab 04/03/17 1646  WBC 8.9  HGB 12.1*  HCT 36.1*  PLT 214   ------------------------------------------------------------------------------------------------------------------  Chemistries  Recent Labs  Lab 04/03/17 1646  NA 134*  K 4.0  CL 97*  CO2 31  GLUCOSE 98  BUN 17  CREATININE 0.87  CALCIUM 8.0*  AST 51*  ALT 45  ALKPHOS 96  BILITOT 0.7   ------------------------------------------------------------------------------------------------------------------  Cardiac Enzymes Recent Labs  Lab 04/03/17 1646  TROPONINI <0.03   ------------------------------------------------------------------------------------------------------------------  RADIOLOGY:  Dg Lumbar Spine 2-3 Views  Result Date: 04/03/2017 CLINICAL DATA:  Back pain EXAM: LUMBAR SPINE - 2-3 VIEW COMPARISON:  None. FINDINGS: Lumbar alignment within normal limits. Mild compression deformity of T12 with estimated 10% loss of height anteriorly of the vertebral body. No obvious retropulsion. Remaining vertebral bodies demonstrate normal stature. Mild diffuse degenerative changes. Nonspecific calcifications in the right lower quadrant. IMPRESSION: Age indeterminate mild compression fracture of T12. Electronically Signed   By: Jasmine Pang M.D.   On: 04/03/2017 20:55   Dg Sacrum/coccyx  Result Date: 04/03/2017 CLINICAL DATA:  Pain  EXAM:  SACRUM AND COCCYX - 2+ VIEW COMPARISON:  None. FINDINGS: Sacrum is obscured by stool and bowel gas on the frontal view. Pubic symphysis and rami are intact. Coarse calcifications in the low pelvis probably in the prostate gland. SI joints are symmetric. Osteopenia limits the exam. No definite acute displaced fracture seen. IMPRESSION: Limited study secondary to osteopenia and bowel gas. No definite acute osseous abnormality Electronically Signed   By: Jasmine Pang M.D.   On: 04/03/2017 20:53    ASSESSMENT AND PLAN:   Active Problems:   Acute cystitis  1.  Acute cystitis with hematuria.   has BPH.  Continue Flomax and add finasteride.  urine culture ,   Rocephin. 2.  Low back pain and unable to walk.   Fracture T12 x-rays of the sacrum and lumbar spine.  Patient having some pain over the sacral area.   orthopedic Consult,  Physical therapy evaluation 3.  Atrial fibrillation history with bradycardia.  Patient on Eliquis.  Hold  zebeta.  Continue amiodarone at 200 mg daily.  Check a digoxin level, decrease the dose. 4.  Hyperlipidemia unspecified on Lipitor 5.  COPD with wheeze continue nebulizer treatments and inhalers 6.  History of orthostatic hypotension on midodrine 7.  Diarrhea.  Order stool studies, Campylobacter species found. 8.  Stage I decubiti with erythema on the buttock.  Duodenum ordered  Physical therapy and orthopedic evaluation. Spoke to his ex-wife in the room.   All the records are reviewed and case discussed with Care Management/Social Workerr. Management plans discussed with the patient, family and they are in agreement.  CODE STATUS: full  TOTAL TIME TAKING CARE OF THIS PATIENT: 35 minutes.     POSSIBLE D/C IN 1-2 DAYS, DEPENDING ON CLINICAL CONDITION.   Altamese Dilling M.D on 04/04/2017   Between 7am to 6pm - Pager - [phone]  After 6pm go to [email or web address] - password [email or web address]  [facility]  Office   [phone]  CC: Primary care physician; [facility], [facility]  Note: This dictation was prepared with Dragon dictation along with smaller phrase technology. Any transcriptional errors that result from this process are unintentional.

## 2017-04-04 NOTE — Progress Notes (Signed)
Initial Nutrition Assessment  DOCUMENTATION CODES:   Severe malnutrition in context of chronic illness, Underweight  INTERVENTION:  Will add note on diet to chop meat and provide with extra gravy.  Provide Magic cup TID with meals, each supplement provides 290 kcal and 9 grams of protein.  Provide Boost Breeze po TID, each supplement provides 250 kcal and 9 grams of protein.  Monitor magnesium, potassium, and phosphorus daily for at least 3 days, MD to replete as needed, as pt is at risk for refeeding syndrome given severe chronic malnutrition.  Provide MVI daily.  Provide Vitamin C 250 mg BID.  NUTRITION DIAGNOSIS:   Severe Malnutrition related to chronic illness(COPD, tuberculosis, hepatitis C, cirrhosis, CHF) as evidenced by severe fat depletion, severe muscle depletion.  GOAL:   Patient will meet greater than or equal to 90% of their needs  MONITOR:   PO intake, Supplement acceptance, Labs, Weight trends, Skin, I & O's  REASON FOR ASSESSMENT:   Malnutrition Screening Tool    ASSESSMENT:   72 year old male with PMHx of COPD, PVD s/p right AKA, HTN, tuberculosis, GERD, hepatitis C, anxiety, cirrhosis of liver, hx hepatic encephalopathy, paroxysmal A-fib, nonischemic cardiomyopathy, chronic systolic CHF now admitted with acute cystitis with hematuria, low back pain and inability to walk, diarrhea, stage I decubiti on buttocks.    Met with patient, his daughter, and his ex-wife at bedside. He reports he has had a poor appetite for 3-4 months now. He thinks it may be related to a medication he started taking, but he is not sure which one as he was started on several at the same time. He now has taste changes and cannot eat well at all. His family prepares him 3 well-balanced meals daily, but he may only take a bite and then spit it out because it does not taste good. He does still drink milk and chocolate milk and is drinking about one quart to 1/2 gallon of milk daily  ([phone] kcal; 31-62 grams protein). He dislikes Ensure. He is amenable to trying [employer] and [employer]. He has difficulty chewing but does not want his diet fully downgraded. Okay with getting meat chopped.  UBW was 114 lbs. Per family weight history in chart likely not correct. As he was 98 lbs in 09/2016, they believe he is actually 80 lbs or less now as he has lost weight since then. Bed scale was not zeroed correctly, so was reading very high weight and patient reports he is too weak for standing weight.  Medications reviewed and include: amiodarone, azithromycin, Flomax, ceftriaxone.  Labs reviewed: Sodium 134, Chloride 97.  NUTRITION - FOCUSED PHYSICAL EXAM:    Most Recent Value  Orbital Region  Severe depletion  Upper Arm Region  Severe depletion  Thoracic and Lumbar Region  Severe depletion  Buccal Region  Severe depletion  Temple Region  Severe depletion  Clavicle Bone Region  Severe depletion  Clavicle and Acromion Bone Region  Severe depletion  Scapular Bone Region  Severe depletion  Dorsal Hand  Severe depletion  Patellar Region  Unable to assess [s/p R AKA, severe edema LLE]  Anterior Thigh Region  Unable to assess [s/p R AKA, severe edema LLE]  Posterior Calf Region  Unable to assess [s/p R AKA, severe edema LLE]  Edema (RD Assessment)  Severe  Hair  Reviewed  Eyes  Reviewed  Mouth  Reviewed [poor dentition]  Skin  Reviewed  Nails  Reviewed     Diet Order:  Diet regular Room service appropriate? Yes with Assist; Fluid consistency: Thin  EDUCATION NEEDS:   No education needs have been identified at this time  Skin:  Skin Assessment: Skin Integrity Issues: Skin Integrity Issues:: Stage I, Other (Comment) Stage I: sacrum (3cm x 2cm), coccyx Other: scattered ecchymosis  Last BM:  04/04/2017  Height:   Ht Readings from Last 1 Encounters:  04/03/17 6' (1.829 m)    Weight:   Wt Readings from Last 1 Encounters:  04/03/17 98 lb 9.6 oz (44.7 kg)     Ideal Body Weight:  74.4 kg(adjusted for AKA)  BMI:  Body mass index is 13.37 kg/m.  Estimated Nutritional Needs:   Kcal:  1340-1565 (30-35 kcal/kg)  Protein:  67-80 grams (1.5-1.8 grams/kg)  Fluid:  1.3-1.6 L/day (30-35 mL/kg)  Willey Blade, MS, RD, LDN Office: [phone] Pager: [phone] After Hours/Weekend Pager: [phone]

## 2017-04-04 NOTE — Progress Notes (Addendum)
Physical Therapy Evaluation Patient Details Name: [name] MRN: [id number] DOB: 07-20-1945 Today's Date: 04/04/2017   History of Present Illness  [name]  is a 72 y.o. male presents after a fall 3 weeks ago.  He has been having back pain. Pt has frequent falls per family.  Pt has an AKA and he states that the [place] is supposed to fit him for a new prosthesis because it keeps on falling off and he has to hold it on in order to walk. Per ex-wife he was supposed to go to an appointment for his prosthesis yesterday.  He lives with his ex-wife.  He states that he has a sore on his buttock.  He urinates very little at a time and some burning on urination.  Patient was found to have a positive urine analysis.  Some shortness of breath and some cough.  Positive for diarrhea 6 times today. He is now on enteric precautions and per chart had a positive GI panel. Radiographs showed T12 compression fracture of undermined age. MRI is pending. ER physician was unable to transfer him to the [place]. He is now admitted for acute cystitis, low back pain, and afib with bradycardia.  Clinical Impression  Pt admitted with above diagnosis. Pt currently with functional limitations due to the deficits listed below (see PT Problem List). Pt is modified independent for bed mobility. He moves slowly but is able to get to EOB without external assist from therapist. HOB elevated and use of bed rails. Pt is able to transfer to standing with therapist blocking L knee and providing modA+1 to come to standing. Pt puts arms around therapist to pull up to standing. Once upright requires min/modA+1 to maintain balance. He expresses a high fear of falling in standing and is very anxious throughout transfer training. Unable to attempt ambulation a this time. Pt denies increase in back pain with bed mobility or transfers with therapist. Pt reports DOE with transfers however SaO2>90% on room air. Pt is able to perform sliding board transfer  from bed to recliner with minA+1. Cues for proper sequencing and education about how to properly use sliding board. Pt hasn't walked in over 8 months. Up until 1.5 weeks ago family reports that he was crawling to the bathroom and over the last few days he has just been urinating and defecating in his recliner. Family reports that they try to help him but he can be very fearful of falling due to his blood thinners that he doesn't want to get up. He demonstrates a suprising amount of strength today and with therapy he could certainly improve his mobility to at least modified independent with sliding board transfers to/from wheelchair. Recommend SNF placement however this will depend on patients motivation. It appears that at least some of patients motivation issues are related to anxiety and fear of falling. Again he does not report pain as a limiting factor in mobility with therapist and no noted increase pain during evaluation. If he refuses SNF placement please arrange HH PT. Pt will benefit from PT services to address deficits in strength, balance, and mobility in order to return to full function at home.    Follow Up Recommendations SNF    Equipment Recommendations  Other (comment)(Needs his prosthesis re-fitted)    Recommendations for Other Services       Precautions / Restrictions Precautions Precautions: Fall Restrictions Weight Bearing Restrictions: No Other Position/Activity Restrictions: Pt has R AKA, no prosthesis that will fit and not in  room      Mobility  Bed Mobility Overal bed mobility: Modified Independent             General bed mobility comments: Pt moves slowly but is able to get to EOB without external assist. HOB elevated and use of bed rails  Transfers Overall transfer level: Needs assistance Equipment used: 1 person hand held assist Transfers: Sit to/from Stand Sit to Stand: Mod assist         General transfer comment: Pt able to transfer with therapist  blocking L knee and providing modA+1 to come to standing. Pt puts arms around therapist to pull up to standing. Once upright requires min/modA+1 to maintain balance. Pt with high fear of falling in standing and is very anxious. Unable to attempt ambulation a this time. Pt reports DOE with transfers however SaO2>90% on room air. Pt is able to perform sliding board transfer from bed to recliner with minA+1. Cues for proper sequencing with sliding board.  Ambulation/Gait                Stairs            Wheelchair Mobility    Modified Rankin (Stroke Patients Only)       Balance Overall balance assessment: Needs assistance Sitting-balance support: No upper extremity supported Sitting balance-Leahy Scale: Fair Sitting balance - Comments: Able to tolerate minimal challenge. Loses balance backwards with mod challenge   Standing balance support: Bilateral upper extremity supported Standing balance-Leahy Scale: Poor Standing balance comment: Requires modA+1 to remain in standing                             Pertinent Vitals/Pain Pain Assessment: 0-10 Pain Score: 3  Pain Location: "My butt" Pain Intervention(s): Monitored during session    Home Living Family/patient expects to be discharged to:: Private residence Living Arrangements: Other relatives;Other (Comment)(ex-place) Available Help at Discharge: Available 24 hours/day Type of Home: Mobile home Home Access: Stairs to enter Entrance Stairs-Rails: Right;Left Entrance Stairs-Number of Steps: 3 Home Layout: One level Home Equipment: Walker - 2 wheels;Cane - single point;Shower seat(no wheelchair, no hospital bed, no O2) Additional Comments: R LE prosthesis    Prior Function Level of Independence: Needs assistance   Gait / Transfers Assistance Needed: Holds onto prosthesis with walking (with one UE) d/t it sliding off (residual limb too small for prosthesis and does not fit well). Pt mostly has been crawling  on the floor recently and for the last 1.5 weeks he has not been able to get out of his recliner. Multiple falls in the last 12 months but only 2 that resulted in injury  ADL's / Homemaking Assistance Needed: Ex-wife assists with cooking and cleaning.  Washes up in the tub and then turns on the shower to wash off.        Hand Dominance   Dominant Hand: Right    Extremity/Trunk Assessment   Upper Extremity Assessment Upper Extremity Assessment: Generalized weakness    Lower Extremity Assessment Lower Extremity Assessment: Generalized weakness LLE Deficits / Details: Pt able to perform SLR and tolerate resistance. In sitting 4+/5 L knee extension and flexion. 4-/5 L hip flexion. R hip flexion is 4/5. Hip abduction/adduction appears to be at least 4- to 4/5 bilaterally. R AKA       Communication   Communication: No difficulties  Cognition Arousal/Alertness: Awake/alert Behavior During Therapy: WFL for tasks assessed/performed Overall Cognitive Status: Within Functional Limits  for tasks assessed                                 General Comments: Pt is AOx4 at time of PT evaluation      General Comments      Exercises     Assessment/Plan    PT Assessment Patient needs continued PT services  PT Problem List Decreased strength;Decreased activity tolerance;Decreased balance;Decreased mobility;Decreased knowledge of use of DME;Decreased safety awareness       PT Treatment Interventions DME instruction;Gait training;Functional mobility training;Therapeutic activities;Therapeutic exercise;Balance training;Neuromuscular re-education;Patient/family education    PT Goals (Current goals can be found in the Care Plan section)  Acute Rehab PT Goals Patient Stated Goal: Return to prior function at home PT Goal Formulation: With patient/family Time For Goal Achievement: 04/18/17 Potential to Achieve Goals: Fair    Frequency Min 2X/week   Barriers to discharge Other  (comment)(Difficult to assess how much support he has from family)      Co-evaluation               AM-PAC PT "6 Clicks" Daily Activity  Outcome Measure Difficulty turning over in bed (including adjusting bedclothes, sheets and blankets)?: A Little Difficulty moving from lying on back to sitting on the side of the bed? : A Lot Difficulty sitting down on and standing up from a chair with arms (e.g., wheelchair, bedside commode, etc,.)?: Unable Help needed moving to and from a bed to chair (including a wheelchair)?: A Lot Help needed walking in hospital room?: Total Help needed climbing 3-5 steps with a railing? : Total 6 Click Score: 10    End of Session Equipment Utilized During Treatment: Gait belt Activity Tolerance: Patient tolerated treatment well Patient left: in chair;with call bell/phone within reach;with chair alarm set Nurse Communication: Mobility status;Need for lift equipment;Other (comment)(Use sliding board for transfers) PT Visit Diagnosis: Unsteadiness on feet (R26.81);Other abnormalities of gait and mobility (R26.89);Repeated falls (R29.6);Muscle weakness (generalized) (M62.81);Difficulty in walking, not elsewhere classified (R26.2)    Time: 1420-1450 PT Time Calculation (min) (ACUTE ONLY): 30 min   Charges:   PT Evaluation $PT Eval Moderate Complexity: 1 Mod PT Treatments $Therapeutic Activity: 8-22 mins   PT G Codes:        Sharalyn Ink Kellyn Mccary PT, DPT    Jsaon Yoo 04/04/2017, 4:23 PM

## 2017-04-04 NOTE — Progress Notes (Signed)
MD notified of positive GI panel result.

## 2017-04-04 NOTE — Consult Note (Signed)
ORTHOPAEDIC CONSULTATION  REQUESTING PHYSICIAN: Altamese Dilling, *  Chief Complaint:   Lower back pain.  History of Present Illness: [name] is a 72 y.o. male with multiple medical problems including COPD, cirrhosis, hep C, hepatic encephalopathy, hypertension, kidney stones, coronary artery disease, and paroxysmal atrial fibrillation who lives independently.  Apparently he lost his balance while in his bathroom and fell down, striking his buttocks against the edge of his tub.  This injury occurred apparently 3-4 weeks ago.  He has had continued discomfort with any attempted standing or ambulation since that time.  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  He is in the process of trying to get a new one fit for him through the [place].  Past Medical History:  Diagnosis Date  . Anxiety   . Arthritis    "hands" (11/29/2015)  . Chronic systolic CHF (congestive heart failure) (HCC)    a. 05/2015 Echo: EF 15-20%, diff HK.  Marland Kitchen Cirrhosis of liver (HCC)   . COPD (chronic obstructive pulmonary disease) (HCC)   . GERD (gastroesophageal reflux disease)   . Headache    "weekly, if that" (11/29/2015)  . 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 Hepatitis C   . High cholesterol   . History of blood transfusion 2000  . Hypertension   . Kidney stones    "alot"  . Marijuana abuse   . NICM (nonischemic cardiomyopathy) (HCC)    a. 05/2015 Echo: EF 15-20%, diff HK, mod MR, sev dil LA, mild TR, PASP ;  b. 05/2015 R/LHC: LM nl, LAD 30d, LCX nl, RCA 40ost, 99m, EF 20%.  . Non-obstructive CAD (coronary artery disease)    a. 05/2015 R/LHC: LM nl, LAD 30d, LCX nl, RCA 40ost, 18m, EF 20%.  . Paroxysmal atrial fibrillation (HCC)    a. 05/2015 - converted on amio. Also  placed on Eliquis (CHA2DS2VASc = 3), but never took b/c he couldn't afford and didn't f/u @ [place].  Marland Kitchen Pneumonia 05/2015  . 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  . Tobacco abuse   . Tuberculosis 2007   Past Surgical History:  Procedure Laterality Date  . ABOVE KNEE LEG AMPUTATION Right 2000  . CARDIAC CATHETERIZATION N/A 06/25/2015   Procedure: Right/Left Heart Cath and Coronary Angiography;  Surgeon: Dolores Patty, MD;  Location[facility] INVASIVE CV LAB;  Service: Cardiovascular;  Laterality: N/A;  . CATARACT EXTRACTION W/ INTRAOCULAR LENS  IMPLANT, BILATERAL Bilateral ~ 2003  . EXTRACORPOREAL SHOCK WAVE LITHOTRIPSY     Social History   Socioeconomic History  . Marital status: Divorced    Spouse name: None  . Number of children: None  . Years of education: None  . Highest education level: None  Social Needs  . Financial resource strain: None  . Food insecurity - worry: None  . Food insecurity - inability: None  . Transportation needs - medical: None  . Transportation needs - non-medical: None  Occupational History  . None  Tobacco Use  . Smoking status: Former Smoker    Years: 50.00    Types: Cigarettes  . Smokeless tobacco: Never Used  . Tobacco comment: used to smoke 2ppd but for past 10 yrs has been smoking 1 or less/day - usually just a few puffs off of someone else's cigarette  Substance and Sexual Activity  . Alcohol use: No    Comment: used to drink heavily - quit in 2000  . Drug use: Yes    Types: Marijuana    Comment: occasional marijuana cigarette  . Sexual activity: None  Other Topics Concern  . None  Social History Narrative   Lives in [place].  Does not routinely exercise.  Has not f/u with medical providers @ [place].  Does not take previously Rx medications.   Family History  Problem Relation Age of Onset  . Dementia Mother    No Known Allergies Prior to Admission medications   Medication Sig Start Date End Date  Taking? Authorizing Provider  amiodarone (PACERONE) 400 MG tablet Take 1 tablet (400 mg total) by mouth 2 (two) times daily. Patient taking differently: Take 200 mg by mouth 2 (two) times daily.  10/09/16  Yes Auburn Bilberry, MD  apixaban (ELIQUIS) 5 MG TABS tablet Take 1 tablet (5 mg total) by mouth 2 (two) times daily. 10/09/16  Yes Auburn Bilberry, MD  atorvastatin (LIPITOR) 40 MG tablet Take 1 tablet (40 mg total) by mouth daily at 6 PM. 10/09/16  Yes Auburn Bilberry, MD  digoxin (LANOXIN) 0.125 MG tablet Take 1 tablet (0.125 mg total) by mouth daily. 10/10/16  Yes Auburn Bilberry, MD  docusate sodium (COLACE) 100 MG capsule Take 100 mg by mouth 2 (two) times daily.   Yes [provider]  Ipratropium-Albuterol (COMBIVENT RESPIMAT) 20-100 MCG/ACT AERS respimat Inhale 1 puff into the lungs every 6 (six) hours. 10/09/16  Yes Auburn Bilberry, MD  midodrine (PROAMATINE) 5 MG tablet Take 1 tablet (5 mg total) by mouth 2 (two) times daily at 10 AM and 5 PM. 10/09/16  Yes Auburn Bilberry, MD  tamsulosin (FLOMAX) 0.4 MG CAPS capsule Take 0.4 mg by mouth at bedtime.   Yes [provider]  bisoprolol (ZEBETA) 5 MG tablet Take 1 tablet (5 mg total) by mouth daily. Patient not taking: Reported on 04/03/2017 10/10/16   Auburn Bilberry, MD  mometasone-formoterol [facility]) 200-5 MCG/ACT AERO Inhale 2 puffs into the lungs 2 (two) times daily. Patient not taking: Reported on 04/03/2017 10/09/16   Auburn Bilberry, MD  tiotropium (SPIRIVA HANDIHALER) 18 MCG inhalation capsule Place 1 capsule (18 mcg total) into inhaler and inhale every morning. Patient not taking: Reported on 04/03/2017 10/09/16   Auburn Bilberry, MD   Dg Lumbar Spine 2-3 Views  Result Date: 04/03/2017 CLINICAL DATA:  Back pain EXAM: LUMBAR SPINE - 2-3 VIEW COMPARISON:  None. FINDINGS: Lumbar alignment within normal limits. Mild compression deformity of T12 with estimated 10% loss of height anteriorly of the vertebral body. No obvious  retropulsion. Remaining vertebral bodies demonstrate normal stature. Mild diffuse degenerative changes. Nonspecific calcifications in the right lower quadrant. IMPRESSION: Age indeterminate mild compression fracture of T12. Electronically Signed   By: Jasmine Pang M.D.   On: 04/03/2017 20:55   Dg Sacrum/coccyx  Result Date: 04/03/2017 CLINICAL DATA:  Pain EXAM: SACRUM AND COCCYX - 2+ VIEW COMPARISON:  None. FINDINGS: Sacrum is obscured by stool and bowel gas on the frontal view. Pubic symphysis and rami are intact. Coarse calcifications in the low pelvis probably in the prostate gland. SI joints are symmetric. Osteopenia limits the exam. No definite acute displaced fracture seen. IMPRESSION: Limited study secondary to osteopenia and bowel gas. No definite acute osseous abnormality Electronically Signed   By: Jasmine Pang M.D.   On: 04/03/2017 20:53    Positive ROS: All other systems have been  reviewed and were otherwise negative with the exception of those mentioned in the HPI and as above.  Physical Exam: General:  Alert, no acute distress Psychiatric:  Patient is competent for consent with normal mood and affect   Cardiovascular:  No pedal edema Respiratory:  No wheezing, non-labored breathing GI:  Abdomen is soft and non-tender Skin:  No lesions in the area of chief complaint Neurologic:  Sensation intact distally Lymphatic:  No axillary or cervical lymphadenopathy  Orthopedic Exam:  Orthopedic examination is limited to his thoracolumbar spine and left lower extremity.  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  Skin inspection of his back is unremarkable.  There is no swelling, erythema, ecchymosis, abrasions, or other skin abnormalities noted.  He has mild tenderness to percussion over the thoracolumbar junction, as well as over the lower lumbar spine.  He demonstrates pain-free motion of both hips, the left knee, the left ankle, and his left toes.  He is  neurovascularly intact to the left lower extremity and foot with fair capillary refill to his left foot.  X-rays:  AP and lateral x-rays of the lumbar spine are available for review.  These films demonstrate an approximately 10-15% anterior wedge compression fracture of T12.  This fracture was not present on chest x-rays from August.  No significant degenerative changes are identified.  There is no evidence of spondylolysis or spondylolisthesis.  No lytic lesions are identified.  Assessment: Subacute T12 compression fracture.  Plan: The treatment options are discussed with the patient, including both surgical (kyphoplasty) and nonsurgical choices.  The patient is quite frustrated by the pain he experience is with any attempted ambulation or standing, and would like to consider surgical intervention.  Therefore, I will ask Dr. Rosita Kea, my partner who does the kyphoplasties, to evaluate this patient for consideration of this procedure.  In order to facilitate Dr. Neomia Glass assessment of the patient, the patient also will be sent for an MRI scan of the lumbar spine to assess the acuity of this injury.  Thank you for asked me to participate in the care of this most pleasant man.  Either myself or Dr. Rosita Kea will be happy to follow him with you.   Maryagnes Amos, MD  Beeper #:  [phone]  04/04/2017 4:26 PM

## 2017-04-04 NOTE — Clinical Social Work Note (Signed)
Clinical Social Work Assessment  Patient Details  Name: [name] MRN: [id number] Date of Birth: May 15, 1945  Date of referral:  04/04/17               Reason for consult:  Facility Placement                Permission sought to share information with:  Chartered certified accountant granted to share information::  Yes, Verbal Permission Granted  Name::      [place]::   [place]  Relationship::     Contact Information:     Housing/Transportation Living arrangements for the past 2 months:  [place] of Information:  Patient Patient Interpreter Needed:  None Criminal Activity/Legal Involvement Pertinent to Current Situation/Hospitalization:  No - Comment as needed Significant Relationships:  Adult Children Lives with:  Other (Comment)(ex-wife [name] ) Do you feel safe going back to the place where you live?  Yes Need for family participation in patient care:  Yes (Comment)  Care giving concerns:  Patient lives in [place], [place] with his ex-wife [name].    Social Worker assessment / plan:  Holiday representative (CSW) received verbal consult from PT that recommendation is SNF. CSW met with patient alone at bedside to address consult. Patient was alert and oriented X4 and was laying in the bed. CSW introduced self and explained role of CSW department. Patient reported that he lives in [place] with his ex-wife [name] and is a English as a second language teacher. Patient reported that he goes to the local [facility] and has medicare part A. Patient reported that his daughter [name] is also involved. CSW explained that PT is recommending SNF and medicare part A will pay for room and board at SNF. Patient reported that he would consider a local SNF and asked CSW to also call the [place] about SNF. FL2 complete and faxed out. CSW left a Advertising account executive for [facility] SNF admissions coordinator at the [place] in [place] will continue to follow and assist as needed.     Employment status:  Disabled (Comment on whether or not currently receiving Disability), Retired Forensic scientist:  Medicare, [place] Benefit PT Recommendations:  Not assessed at this time Information / Referral to community resources:  [place]  Patient/Family's Response to care:  Patient will consider SNF.   Patient/Family's Understanding of and Emotional Response to Diagnosis, Current Treatment, and Prognosis:  Patient was guarded with his answers and tried to change the subject about SNF placement.   Emotional Assessment Appearance:  Appears stated age Attitude/Demeanor/Rapport:    Affect (typically observed):  Accepting, Adaptable, Pleasant Orientation:  Oriented to Self, Oriented to Place, Oriented to  Time, Oriented to Situation Alcohol / Substance use:  Not Applicable Psych involvement (Current and /or in the community):  No (Comment)  Discharge Needs  Concerns to be addressed:  Discharge Planning Concerns Readmission within the last 30 days:  No Current discharge risk:  Dependent with Mobility Barriers to Discharge:  Continued Medical Work up   [employer], James Beets, LCSW 04/04/2017, 4:25 PM

## 2017-04-04 NOTE — Clinical Social Work Placement (Signed)
   CLINICAL SOCIAL WORK PLACEMENT  NOTE  Date:  04/04/2017  Patient Details  Name: [name] MRN: [id number] Date of Birth: 11-Jan-1946  Clinical Social Work is seeking post-discharge placement for this patient at the Skilled  Nursing Facility level of care (*CSW will initial, date and re-position this form in  chart as items are completed):  Yes   Patient/family provided with [facility] Clinical Social Work Department's list of facilities offering this level of care within the geographic area requested by the patient (or if unable, by the patient's family).  Yes   Patient/family informed of their freedom to choose among providers that offer the needed level of care, that participate in Medicare, Medicaid or managed care program needed by the patient, have an available bed and are willing to accept the patient.  Yes   Patient/family informed of [facility]'s ownership interest in [facility] and [facility], as well as of the fact that they are under no obligation to receive care at these facilities.  PASRR submitted to EDS on 04/04/17     PASRR number received on 04/04/17     Existing PASRR number confirmed on       FL2 transmitted to all facilities in geographic area requested by pt/family on 04/04/17     FL2 transmitted to all facilities within larger geographic area on       Patient informed that his/her managed care company has contracts with or will negotiate with certain facilities, including the following:            Patient/family informed of bed offers received.  Patient chooses bed at       Physician recommends and patient chooses bed at      Patient to be transferred to   on  .  Patient to be transferred to facility by       Patient family notified on   of transfer.  Name of family member notified:        PHYSICIAN       Additional Comment:    _______________________________________________ Milledge Gerding, Darleen Crocker, LCSW 04/04/2017, 4:23 PM

## 2017-04-05 ENCOUNTER — Encounter
Admission: EM | Disposition: A | Discharge status: Skilled Nursing Facility | Payer: Self-pay | Source: Home / Self Care | Attending: Internal Medicine

## 2017-04-05 LAB — PHOSPHORUS: Phosphorus: 2.8 mg/dL (ref 2.5–4.6)

## 2017-04-05 LAB — MAGNESIUM: MAGNESIUM: 1.7 mg/dL (ref 1.7–2.4)

## 2017-04-05 SURGERY — KYPHOPLASTY
Anesthesia: Choice

## 2017-04-05 MED ORDER — APIXABAN 5 MG PO TABS
5.0000 mg | ORAL_TABLET | Freq: Two times a day (BID) | ORAL | Status: DC
  Administered 2017-04-05 – 2017-04-06 (×3): 5 mg via ORAL
  Filled 2017-04-05 (×3): qty 1

## 2017-04-05 MED ORDER — OXYMETAZOLINE HCL 0.05 % NA SOLN
1.0000 | Freq: Two times a day (BID) | NASAL | Status: DC
  Administered 2017-04-05 – 2017-04-06 (×2): 1 via NASAL
  Filled 2017-04-05: qty 15

## 2017-04-05 MED ORDER — AMIODARONE HCL 200 MG PO TABS
100.0000 mg | ORAL_TABLET | Freq: Every day | ORAL | Status: DC
Start: 2017-04-06 — End: 2017-04-06

## 2017-04-05 NOTE — Progress Notes (Signed)
MRI showed T 12 is chronic, no indication for kyphoplasty. Diet and eliquis re ordered.

## 2017-04-05 NOTE — Progress Notes (Addendum)
Physical Therapy Treatment Patient Details Name: [name] MRN: [id number] DOB: March 04, 1945 Today's Date: 04/05/2017    History of Present Illness [name]  is a 72 y.o. male presents after a fall 3 weeks ago.  He has been having back pain. Pt has frequent falls per family.  Pt has an AKA and he states that the [place] is supposed to fit him for a new prosthesis because it keeps on falling off and he has to hold it on in order to walk. Per ex-wife he was supposed to go to an appointment for his prosthesis yesterday.  He lives with his ex-wife.  He states that he has a sore on his buttock.  He urinates very little at a time and some burning on urination.  Patient was found to have a positive urine analysis.  Some shortness of breath and some cough.  Positive for diarrhea 6 times today. He is now on enteric precautions and per chart had a positive GI panel. Radiographs showed T12 compression fracture of undermined age. MRI is pending. ER physician was unable to transfer him to the [place]. He is now admitted for acute cystitis, low back pain, and afib with bradycardia.    PT Comments    Pt making progress with therapy on this date. He is able to complete all bed exercises as instructed. He is able to perform sliding board transfer again from bed to recliner with therapist. He is also able to transfer to standing with therapist blocking L knee and providing modA+1. Pt puts arms around therapist to pull up to standing. Improved sequencing and confidence compared to yesterday. Once upright requires min/modA+1 to maintain balance. Pt able to remain in standing for longer period of time compared to yesterday. Able to perform standing mini squats on LLE. Pt with high fear of falling in standing and is very anxious. Unable to attempt ambulation at this time. Pt is able to perform sliding board transfer from bed to recliner with minA+1. Cues for proper sequencing with sliding board. Pt needs SNF placement at  discharge in order to facilitate safe return home. Pt will benefit from PT services to address deficits in strength, balance, and mobility in order to return to full function at home.    Follow Up Recommendations  SNF     Equipment Recommendations  Other (comment)(Needs his prosthesis re-fitted)    Recommendations for Other Services       Precautions / Restrictions Precautions Precautions: Fall Restrictions Weight Bearing Restrictions: No Other Position/Activity Restrictions: Pt has R AKA, no prosthesis that will fit and not in room    Mobility  Bed Mobility Overal bed mobility: Modified Independent             General bed mobility comments: Pt moves slowly but is able to get to EOB without external assist. HOB elevated and use of bed rails  Transfers Overall transfer level: Needs assistance Equipment used: 1 person hand held assist Transfers: Sit to/from Stand Sit to Stand: Mod assist         General transfer comment: Pt able to transfer with therapist blocking L knee and providing modA+1 to come to standing. Pt puts arms around therapist to pull up to standing. Improved sequencing and confidence compared to yesterday. Once upright requires min/modA+1 to maintain balance. Pt able to remain in standing for longer period of time. Able to perform standing mini squats on LLE. Pt with high fear of falling in standing and is very anxious. Unable  to attempt ambulation at this time. Pt is able to perform sliding board transfer from bed to recliner with minA+1. Cues for proper sequencing with sliding board.  Ambulation/Gait                 Stairs            Wheelchair Mobility    Modified Rankin (Stroke Patients Only)       Balance Overall balance assessment: Needs assistance Sitting-balance support: No upper extremity supported Sitting balance-Leahy Scale: Fair     Standing balance support: Bilateral upper extremity supported Standing balance-Leahy  Scale: Poor Standing balance comment: Requires modA+1 to remain in standing                            Cognition Arousal/Alertness: Awake/alert Behavior During Therapy: WFL for tasks assessed/performed Overall Cognitive Status: Within Functional Limits for tasks assessed                                        Exercises General Exercises - Lower Extremity Ankle Circles/Pumps: AROM;Left;10 reps Quad Sets: Strengthening;Left;10 reps Gluteal Sets: Strengthening;Both;10 reps Short Arc Quad: Left;15 reps;Supine Heel Slides: Left;15 reps;Supine Hip ABduction/ADduction: 15 reps;Supine;Both Straight Leg Raises: Both;15 reps;Supine Other Exercises Other Exercises: Standing LLE mini squats x 10    General Comments        Pertinent Vitals/Pain Pain Assessment: 0-10 Pain Score: 6  Pain Location: "My butt" Pain Intervention(s): Monitored during session    Home Living                      Prior Function            PT Goals (current goals can now be found in the care plan section) Acute Rehab PT Goals Patient Stated Goal: Return to prior function at home PT Goal Formulation: With patient/family Time For Goal Achievement: 04/18/17 Potential to Achieve Goals: Fair Progress towards PT goals: Progressing toward goals    Frequency    Min 2X/week      PT Plan Current plan remains appropriate    Co-evaluation              AM-PAC PT "6 Clicks" Daily Activity  Outcome Measure  Difficulty turning over in bed (including adjusting bedclothes, sheets and blankets)?: A Little Difficulty moving from lying on back to sitting on the side of the bed? : A Little Difficulty sitting down on and standing up from a chair with arms (e.g., wheelchair, bedside commode, etc,.)?: Unable Help needed moving to and from a bed to chair (including a wheelchair)?: A Lot Help needed walking in hospital room?: Total Help needed climbing 3-5 steps with a  railing? : Total 6 Click Score: 11    End of Session Equipment Utilized During Treatment: Gait belt Activity Tolerance: Patient tolerated treatment well Patient left: in chair;with call bell/phone within reach;with chair alarm set Nurse Communication: Mobility status;Other (comment)(Use sliding board for transfers) PT Visit Diagnosis: Unsteadiness on feet (R26.81);Other abnormalities of gait and mobility (R26.89);Repeated falls (R29.6);Muscle weakness (generalized) (M62.81);Difficulty in walking, not elsewhere classified (R26.2)     Time: 4098-1191 PT Time Calculation (min) (ACUTE ONLY): 28 min  Charges:  $Therapeutic Exercise: 8-22 mins $Therapeutic Activity: 8-22 mins  G Codes:      Sharalyn Ink Hawa Henly PT, DPT     Adelynn Gipe 04/05/2017, 5:26 PM

## 2017-04-05 NOTE — Progress Notes (Signed)
Clinical Education officer, museum (CSW) spoke to [facility] admissions coordinator for the short term rehab beds at the [facility] today. Per [facility] she has no beds available and is not able to pull patient's information in the [place] data base. Per [place] she will try to get registration to put in patient's information in the data base. CSW met with patient and made him aware of above. CSW explained that patient can go to a local SNF under his medicare part A benefit. CSW presented bed offers and patient chose [facility]. CSW contacted patient's daughter Maudie Mercury and made her aware of above. [facility] liaison is aware of above.   [employer], LCSW [phone]

## 2017-04-05 NOTE — Care Management Important Message (Signed)
Important Message  Patient Details  Name: [name] MRN: [id number] Date of Birth: 01/05/1946   Medicare Important Message Given:  Yes    Gwenette Greet, RN 04/05/2017, 1:37 PM

## 2017-04-06 LAB — URINE CULTURE: Culture: >=100000 — AB

## 2017-04-06 MED ORDER — FINASTERIDE 5 MG PO TABS: 5.0000 mg | ORAL_TABLET | Freq: Every day | ORAL | 0 refills | Status: AC

## 2017-04-06 MED ORDER — DIGOXIN 62.5 MCG PO TABS: 0.0625 mg | ORAL_TABLET | Freq: Every day | ORAL | 0 refills | Status: AC

## 2017-04-06 MED ORDER — ASCORBIC ACID 250 MG PO TABS: 250.0000 mg | ORAL_TABLET | Freq: Two times a day (BID) | ORAL | 0 refills | Status: AC

## 2017-04-06 MED ORDER — CEFUROXIME AXETIL 250 MG PO TABS: 250.0000 mg | ORAL_TABLET | Freq: Two times a day (BID) | ORAL | 0 refills | Status: AC

## 2017-04-06 MED ORDER — ADULT MULTIVITAMIN W/MINERALS CH: 1.0000 | ORAL_TABLET | Freq: Every day | ORAL | 0 refills | Status: AC

## 2017-04-06 MED ORDER — OXYCODONE-ACETAMINOPHEN 5-325 MG PO TABS: 1.0000 | ORAL_TABLET | Freq: Four times a day (QID) | ORAL | 0 refills | Status: DC | PRN

## 2017-04-06 NOTE — Progress Notes (Signed)
Patient is medically stable for D/C to [facility] today. Per Jomarie Long[facility] liaison patient can come today to room 713. RN will call report to 700 hall nurse at [phone] and arrange EMS for transport. Clinical Child psychotherapist (CSW) sent D/C orders to [facility] via [facility]. Patient is aware of above. CSW contacted patient's daughter [name] and made her aware of above. Please reconsult if future social work needs arise. CSW signing off.   [employer], LCSW [phone]

## 2017-04-06 NOTE — [place] FL2 (Signed)
[place] MEDICAID FL2 LEVEL OF CARE SCREENING TOOL     IDENTIFICATION  Patient Name: [name] Birthdate: Jun 06, 1945 Sex: male Admission Date (Current Location): 04/03/2017  [place] and [place] Number:  Chiropodist and Address:  [facility], [address] [place], [place] [address]      Provider Number: [id number]  Attending Physician Name and Address:  Altamese Dilling, *  Relative Name and Phone Number:       Current Level of Care: Hospital Recommended Level of Care: Skilled Nursing Facility Prior Approval Number:    Date Approved/Denied:   PASRR Number: [id number]  Discharge Plan: SNF    Current Diagnoses: Patient Active Problem List   Diagnosis Date Noted  . Acute cystitis 04/03/2017  . GERD (gastroesophageal reflux disease) 10/05/2016  . Hepatic cirrhosis (HCC) 12/02/2015  . Pressure injury of skin 12/01/2015  . COPD (chronic obstructive pulmonary disease) (HCC) 12/01/2015  . PAD (peripheral artery disease) (HCC) 12/01/2015  . Encephalopathy 11/28/2015  . Lingular pneumonia   . Panlobular emphysema (HCC)   . Hepatitis C antibody test positive   . TB (tuberculosis), treated   . Acute on chronic systolic CHF (congestive heart failure) (HCC)   . Pressure ulcer 06/24/2015  . Protein-calorie malnutrition, severe 06/24/2015  . Atrial fibrillation with RVR (HCC) 06/23/2015    Orientation RESPIRATION BLADDER Height & Weight     Self, Time, Situation, Place  Normal Continent Weight: 98 lb 9.6 oz (44.7 kg) Height:  6' (182.9 cm)  BEHAVIORAL SYMPTOMS/MOOD NEUROLOGICAL BOWEL NUTRITION STATUS  (none) (none) Continent Diet(regular)  AMBULATORY STATUS COMMUNICATION OF NEEDS Skin   Extensive Assist Verbally Normal, PU Stage and Appropriate Care                       Personal Care Assistance Level of Assistance  Dressing, Bathing Bathing Assistance: Limited assistance   Dressing Assistance: Limited  assistance     Functional Limitations Info  (no issues)          SPECIAL CARE FACTORS FREQUENCY  PT (By licensed PT)    5 times per week.                 Contractures Contractures Info: Not present    Additional Factors Info  Code Status, Allergies Code Status Info: full Allergies Info: nka           Current Medications (04/06/2017):  This is the current hospital active medication list Current Facility-Administered Medications  Medication Dose Route Frequency Provider Last Rate Last Dose  . acetaminophen (TYLENOL) tablet 650 mg  650 mg Oral Q6H PRN Altamese Dilling, MD      . apixaban Everlene Balls) tablet 5 mg  5 mg Oral BID Kennedy Bucker, MD   5 mg at 04/06/17 0912  . atorvastatin (LIPITOR) tablet 40 mg  40 mg Oral q1800 Alford Highland, MD   40 mg at 04/05/17 1844  . cefTRIAXone (ROCEPHIN) 1 g in dextrose 5 % 50 mL IVPB  1 g Intravenous Q24H Alford Highland, MD   Stopped at 04/05/17 2216  . digoxin (LANOXIN) tablet 0.0625 mg  0.0625 mg Oral Daily Altamese Dilling, MD      . feeding supplement (BOOST / RESOURCE BREEZE) liquid 1 Container  1 Container Oral TID BM Altamese Dilling, MD   1 Container at 04/05/17 1504  . finasteride (PROSCAR) tablet 5 mg  5 mg Oral Daily Alford Highland, MD   5 mg at 04/06/17 0912  .  ipratropium-albuterol (DUONEB) 0.5-2.5 (3) MG/3ML nebulizer solution 3 mL  3 mL Inhalation Q6H PRN Altamese Dilling, MD      . midodrine (PROAMATINE) tablet 5 mg  5 mg Oral BID Alford Highland, MD   5 mg at 04/06/17 0912  . mometasone-formoterol (DULERA) 200-5 MCG/ACT inhaler 2 puff  2 puff Inhalation BID Alford Highland, MD   2 puff at 04/06/17 0913  . multivitamin with minerals tablet 1 tablet  1 tablet Oral Daily Altamese Dilling, MD   1 tablet at 04/06/17 0912  . oxyCODONE-acetaminophen (PERCOCET/ROXICET) 5-325 MG per tablet 1 tablet  1 tablet Oral Q4H PRN Altamese Dilling, MD   1 tablet at 04/06/17 0912  . oxymetazoline  (AFRIN) 0.05 % nasal spray 1 spray  1 spray Each Nare BID Altamese Dilling, MD   1 spray at 04/06/17 0914  . tamsulosin (FLOMAX) capsule 0.4 mg  0.4 mg Oral QPC supper Alford Highland, MD   0.4 mg at 04/05/17 1844  . tiotropium (SPIRIVA) inhalation capsule 18 mcg  18 mcg Inhalation q morning - 10a Alford Highland, MD   18 mcg at 04/06/17 0913  . vitamin C (ASCORBIC ACID) tablet 250 mg  250 mg Oral BID Altamese Dilling, MD   250 mg at 04/06/17 0960     Discharge Medications: Please see discharge summary for a list of discharge medications.  Relevant Imaging Results:  Relevant Lab Results:   Additional Information ss: [id number]  Sample, Darleen Crocker, LCSW

## 2017-04-06 NOTE — Progress Notes (Signed)
Patient is being discharged to [facility]. A&O x 4, but forgetful. IV removed with cath intact. VSS. Scripts sent with patient's packet. Report called. Waiting on EMS.

## 2017-04-06 NOTE — Discharge Summary (Addendum)
[facility] [place] [facility]   PATIENT NAME: [name]    MR#:  [id number]  DATE OF BIRTH:  [birth year]  DATE OF ADMISSION:  04/03/2017 ADMITTING PHYSICIAN: Alford Highland, MD  DATE OF DISCHARGE: 04/06/2017   PRIMARY CARE PHYSICIAN: [facility]    ADMISSION DIAGNOSIS:  Weakness [R53.1] Pain [R52] Lower urinary tract infection [N39.0]  DISCHARGE DIAGNOSIS:  Active Problems:   Acute cystitis   SECONDARY DIAGNOSIS:   Past Medical History:  Diagnosis Date  . Anxiety   . Arthritis    "hands" (11/29/2015)  . Chronic systolic CHF (congestive heart failure) (HCC)    a. 05/2015 Echo: EF 15-20%, diff HK.  Marland Kitchen Cirrhosis of liver (HCC)   . COPD (chronic obstructive pulmonary disease) (HCC)   . GERD (gastroesophageal reflux disease)   . Headache    "weekly, if that" (11/29/2015)  . 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 Hepatitis C   . High cholesterol   . History of blood transfusion 2000  . Hypertension   . Kidney stones    "alot"  . Marijuana abuse   . NICM (nonischemic cardiomyopathy) (HCC)    a. 05/2015 Echo: EF 15-20%, diff HK, mod MR, sev dil LA, mild TR, PASP ;  b. 05/2015 R/LHC: LM nl, LAD 30d, LCX nl, RCA 40ost, 59m, EF 20%.  . Non-obstructive CAD (coronary artery disease)    a. 05/2015 R/LHC: LM nl, LAD 30d, LCX nl, RCA 40ost, 42m, EF 20%.  . Paroxysmal atrial fibrillation (HCC)    a. 05/2015 - converted on amio. Also placed on Eliquis (CHA2DS2VASc = 3), but never took b/c he couldn't afford and didn't f/u @ [place].  Marland Kitchen Pneumonia 05/2015  . 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  . Tobacco abuse   . Tuberculosis 2007    HOSPITAL COURSE:   1. Acute cystitis with hematuria.  has BPH. Continue Flomax and add finasteride.  urine culture reviewed ,  Rocephin.   Klebsiella, sensitive to ceftriaxone. 2. Low back pain and unable to walk.  Fracture T12 x-rays of the sacrum  and lumbar spine. Patient having some pain over the sacral area. orthopedic Consult, Physical therapy evaluation   Appreciated. Help, MRI done which shows chronic fracture, no further interventions needed by them. 3. Atrial fibrillation history with bradycardia. Patient on Eliquis. Hold zebeta. Continue amiodarone at 200 mg daily. Check a digoxin level, decrease the dose of amiodarone and digitoxin both as patient had slight bradycardia  hold beta blocker, and amiodarone due to bradycardia. 4. Hyperlipidemia unspecified on Lipitor 5. COPD with wheeze continue nebulizer treatments and inhalers 6. History of orthostatic hypotension on midodrine 7. Diarrhea. Order stool studies, Campylobacter species found. 8. Stage I decubiti with erythema on the buttock. Need frequent position change.  Need Ensure or boost twice daily.  DISCHARGE CONDITIONS:   Stable.  CONSULTS OBTAINED:    DRUG ALLERGIES:  No Known Allergies  DISCHARGE MEDICATIONS:   Allergies as of 04/06/2017   No Known Allergies     Medication List    STOP taking these medications   amiodarone 400 MG tablet Commonly known as:  PACERONE   bisoprolol 5 MG tablet Commonly known as:  ZEBETA     TAKE these medications   apixaban 5 MG Tabs tablet Commonly known as:  ELIQUIS Take 1 tablet (5 mg total) by mouth 2 (two) times daily.   ascorbic acid 250 MG tablet  Commonly known as:  VITAMIN C Take 1 tablet (250 mg total) by mouth 2 (two) times daily.   atorvastatin 40 MG tablet Commonly known as:  LIPITOR Take 1 tablet (40 mg total) by mouth daily at 6 PM.   cefUROXime 250 MG tablet Commonly known as:  CEFTIN Take 1 tablet (250 mg total) by mouth 2 (two) times daily for 4 days.   Digoxin 62.5 MCG Tabs Take 0.0625 mg by mouth daily. Start taking on:  04/07/2017 What changed:    medication strength  how much to take   docusate sodium 100 MG capsule Commonly known as:  COLACE Take 100 mg by mouth 2  (two) times daily.   finasteride 5 MG tablet Commonly known as:  PROSCAR Take 1 tablet (5 mg total) by mouth daily. Start taking on:  04/07/2017   Ipratropium-Albuterol 20-100 MCG/ACT Aers respimat Commonly known as:  COMBIVENT RESPIMAT Inhale 1 puff into the lungs every 6 (six) hours.   midodrine 5 MG tablet Commonly known as:  PROAMATINE Take 1 tablet (5 mg total) by mouth 2 (two) times daily at 10 AM and 5 PM.   mometasone-formoterol 200-5 MCG/ACT Aero Commonly known as:  DULERA Inhale 2 puffs into the lungs 2 (two) times daily.   multivitamin with minerals Tabs tablet Take 1 tablet by mouth daily. Start taking on:  04/07/2017   oxyCODONE-acetaminophen 5-325 MG tablet Commonly known as:  PERCOCET/ROXICET Take 1 tablet by mouth every 6 (six) hours as needed for severe pain.   tamsulosin 0.4 MG Caps capsule Commonly known as:  FLOMAX Take 0.4 mg by mouth at bedtime.   tiotropium 18 MCG inhalation capsule Commonly known as:  SPIRIVA HANDIHALER Place 1 capsule (18 mcg total) into inhaler and inhale every morning.        DISCHARGE INSTRUCTIONS:    Follow with PMD for re-fitting his prosthesis.  If you experience worsening of your admission symptoms, develop shortness of breath, life threatening emergency, suicidal or homicidal thoughts you must seek medical attention immediately by calling 911 or calling your MD immediately  if symptoms less severe.  You Must read complete instructions/literature along with all the possible adverse reactions/side effects for all the Medicines you take and that have been prescribed to you. Take any new Medicines after you have completely understood and accept all the possible adverse reactions/side effects.   Please note  You were cared for by a hospitalist during your hospital stay. If you have any questions about your discharge medications or the care you received while you were in the hospital after you are discharged, you can call the  unit and asked to speak with the hospitalist on call if the hospitalist that took care of you is not available. Once you are discharged, your primary care physician will handle any further medical issues. Please note that NO REFILLS for any discharge medications will be authorized once you are discharged, as it is imperative that you return to your primary care physician (or establish a relationship with a primary care physician if you do not have one) for your aftercare needs so that they can reassess your need for medications and monitor your lab values.    Today   CHIEF COMPLAINT:   Chief Complaint  Patient presents with  . Failure To Thrive    HISTORY OF PRESENT ILLNESS:  Kue Fox  is a 72 y.o. male after a fall 3 weeks ago.  He has been having back pain and been unable to  walk since.  He states that the [place] is supposed to fifth him for new prosthesis because it keeps on falling off he has to hold it on in order to walk.  He lives with his ex-wife.  He states that he has a sore on his buttock.  He urinates very little at a time and some burning on urination.  Patient was found to have a positive urine analysis.  Some shortness of breath and some cough.  Positive for diarrhea 6 times today.  ER physician tried to transfer him to the [place] but the [place] was on diversion.  Hospitalist were contacted for evaluation.   VITAL SIGNS:  Blood pressure 112/65, pulse (!) 55, temperature 98.7 F (37.1 C), temperature source Oral, resp. rate 18, height 6' (1.829 m), weight 44.7 kg (98 lb 9.6 oz), SpO2 93 %.  I/O:    Intake/Output Summary (Last 24 hours) at 04/06/2017 1231 Last data filed at 04/06/2017 0031 Gross per 24 hour  Intake -  Output 320 ml  Net -320 ml    PHYSICAL EXAMINATION:   GENERAL:  72 y.o.-year-old patient lying in the bed with no acute distress.  EYES: Pupils equal, round, reactive to light and accommodation. No scleral icterus. Extraocular muscles intact.  HEENT: Head  atraumatic, normocephalic. Oropharynx and nasopharynx clear.  NECK:  Supple, no jugular venous distention. No thyroid enlargement, no tenderness.  LUNGS: Normal breath sounds bilaterally, no wheezing, rales,rhonchi or crepitation. No use of accessory muscles of respiration.  CARDIOVASCULAR: S1, S2 normal. No murmurs, rubs, or gallops.  ABDOMEN: Soft, nontender, nondistended. Bowel sounds present. No organomegaly or mass.  EXTREMITIES: No pedal edema, cyanosis, or clubbing. Right-sided AKA amputation. NEUROLOGIC: Cranial nerves II through XII are intact. Muscle strength 4-5/5 in all extremities. Sensation intact. Gait not checked.  PSYCHIATRIC: The patient is alert and oriented x 3.  SKIN: No obvious rash, lesion, or ulcer.     DATA REVIEW:   CBC Recent Labs  Lab 04/03/17 1646  WBC 8.9  HGB 12.1*  HCT 36.1*  PLT 214    Chemistries  Recent Labs  Lab 04/03/17 1646 04/05/17 0448  NA 134*  --   K 4.0  --   CL 97*  --   CO2 31  --   GLUCOSE 98  --   BUN 17  --   CREATININE 0.87  --   CALCIUM 8.0*  --   MG  --  1.7  AST 51*  --   ALT 45  --   ALKPHOS 96  --   BILITOT 0.7  --     Cardiac Enzymes Recent Labs  Lab 04/03/17 1646  TROPONINI <0.03    Microbiology Results  Results for orders placed or performed during the hospital encounter of 04/03/17  Urine Culture     Status: Abnormal   Collection Time: 04/03/17  4:42 PM  Result Value Ref Range Status   Specimen Description   Final    URINE, RANDOM Performed at [facility], [address] [place], [place] [address]    Special Requests   Final    NONE Performed at [facility], [address] [place], [place] [address]    Culture >=100,000 COLONIES/mL KLEBSIELLA OXYTOCA (A)  Final   Report Status 04/06/2017 FINAL  Final   Organism ID, Bacteria KLEBSIELLA OXYTOCA (A)  Final      Susceptibility   Klebsiella oxytoca - MIC*    AMPICILLIN >=32 RESISTANT Resistant     CEFAZOLIN <=  4 SENSITIVE  Sensitive     CEFTRIAXONE <=1 SENSITIVE Sensitive     CIPROFLOXACIN <=0.25 SENSITIVE Sensitive     GENTAMICIN <=1 SENSITIVE Sensitive     IMIPENEM <=0.25 SENSITIVE Sensitive     NITROFURANTOIN 32 SENSITIVE Sensitive     TRIMETH/SULFA >=320 RESISTANT Resistant     AMPICILLIN/SULBACTAM 16 INTERMEDIATE Intermediate     PIP/TAZO <=4 SENSITIVE Sensitive     Extended ESBL NEGATIVE Sensitive     * >=100,000 COLONIES/mL KLEBSIELLA OXYTOCA  Gastrointestinal Panel by PCR , Stool     Status: Abnormal   Collection Time: 04/03/17 11:23 PM  Result Value Ref Range Status   Campylobacter species DETECTED (A) NOT DETECTED Final    Comment: RESULT CALLED TO, READ BACK BY AND VERIFIED WITH: NICKY SOLOMON ON 04/04/17 AT 0157 JAG    Plesimonas shigelloides NOT DETECTED NOT DETECTED Final   Salmonella species NOT DETECTED NOT DETECTED Final   Yersinia enterocolitica NOT DETECTED NOT DETECTED Final   Vibrio species NOT DETECTED NOT DETECTED Final   Vibrio cholerae NOT DETECTED NOT DETECTED Final   Enteroaggregative E coli (EAEC) NOT DETECTED NOT DETECTED Final   Enteropathogenic E coli (EPEC) NOT DETECTED NOT DETECTED Final   Enterotoxigenic E coli (ETEC) NOT DETECTED NOT DETECTED Final   Shiga like toxin producing E coli (STEC) NOT DETECTED NOT DETECTED Final   Shigella/Enteroinvasive E coli (EIEC) NOT DETECTED NOT DETECTED Final   Cryptosporidium NOT DETECTED NOT DETECTED Final   Cyclospora cayetanensis NOT DETECTED NOT DETECTED Final   Entamoeba histolytica NOT DETECTED NOT DETECTED Final   Giardia lamblia NOT DETECTED NOT DETECTED Final   Adenovirus F40/41 NOT DETECTED NOT DETECTED Final   Astrovirus NOT DETECTED NOT DETECTED Final   Norovirus GI/GII NOT DETECTED NOT DETECTED Final   Rotavirus A NOT DETECTED NOT DETECTED Final   Sapovirus (I, II, IV, and V) NOT DETECTED NOT DETECTED Final    Comment: Performed at [facility], [address] [place], [place] [address]  C difficile quick  scan w PCR reflex     Status: None   Collection Time: 04/03/17 11:23 PM  Result Value Ref Range Status   C Diff antigen NEGATIVE NEGATIVE Final   C Diff toxin NEGATIVE NEGATIVE Final   C Diff interpretation No C. difficile detected.  Final    Comment: Performed at [facility], [address] [place], [place] [address]    RADIOLOGY:  Mr Lumbar Spine Wo Contrast  Result Date: 04/04/2017 CLINICAL DATA:  72 y/o M; fall 3-4 weeks ago with continued discomfort and pain. EXAM: MRI LUMBAR SPINE WITHOUT CONTRAST TECHNIQUE: Multiplanar, multisequence MR imaging of the lumbar spine was performed. No intravenous contrast was administered. COMPARISON:  04/03/2017 lumbar spine radiographs. FINDINGS: Segmentation:  Standard. Alignment:  Physiologic. Vertebrae: Chronic mild T12 compression deformity without edema. No bone marrow edema, findings of discitis, or suspicious bone lesion. Large chronic L4 superior endplate Schmorl's node. Conus medullaris and cauda equina: Conus extends to the L2 level. Conus and cauda equina appear normal. Paraspinal and other soft tissues: Multiple well-circumscribed T2 hyperintense structures in the kidneys measuring up to 4 cm in left kidney upper pole are compatible with cysts. Infrarenal abdominal aortic aneurysm measuring 3.5 x 3.9 cm. Disc levels: L1-2: No significant disc displacement, foraminal stenosis, or canal stenosis. L2-3: No significant disc displacement, foraminal stenosis, or canal stenosis. L3-4: Small disc bulge with mild facet hypertrophy. Mild bilateral foraminal stenosis. No canal stenosis. L4-5: Small disc bulge and central  annular fissure. Mild facet hypertrophy. Mild bilateral foraminal stenosis. No canal stenosis. L5-S1: No significant disc displacement, foraminal stenosis, or canal stenosis. IMPRESSION: 1. No acute osseous abnormality or malalignment. 2. Chronic T12 compression deformity with mild loss of height and chronic L4 superior endplate  Schmorl's node. 3. Mild lumbar spine degenerative changes predominantly at the L3-4 and L4-5 levels. 4. Mild bilateral L3-4 and L4-5 foraminal stenosis. No canal stenosis. 5. 3.9 cm abdominal aortic aneurysm. Recommend followup by ultrasound in 2 years. This recommendation follows ACR consensus guidelines: White Paper of the ACR Incidental Findings Committee II on Vascular Findings. J Am Coll Radiol 2013; 10:789-794. Electronically Signed   By: Mitzi Hansen M.D.   On: 04/04/2017 20:58    EKG:   Orders placed or performed during the hospital encounter of 04/03/17  . EKG 12-Lead  . EKG 12-Lead  . EKG 12-Lead  . EKG 12-Lead      Management plans discussed with the patient, family and they are in agreement.  CODE STATUS:  Code Status History    Date Active Date Inactive Code Status Order ID Comments User Context   10/06/2016 01:50 10/09/2016 21:01 Full Code [id number]  Oralia Manis, MD ED   11/29/2015 04:15 12/04/2015 00:35 Full Code [id number]  Fuller Plan, MD ED   06/23/2015 09:44 06/28/2015 21:33 Full Code [id number]  Courtney Paris, MD Inpatient    Advance Directive Documentation     Most Recent Value  Type of Advance Directive  Living will  Pre-existing out of facility DNR order (yellow form or pink MOST form)  No data  "MOST" Form in Place?  No data      TOTAL TIME TAKING CARE OF THIS PATIENT: 35 minutes.    Altamese Dilling M.D on 04/06/2017 at 12:31 PM  Between 7am to 6pm - Pager - [phone]  After 6pm go to [email or web address] - password [email or web address]  [facility] [place] [facility]  Office  [phone]  CC: Primary care physician; [facility]   Note: This dictation was prepared with Dragon dictation along with smaller phrase technology. Any transcriptional errors that result from this process are unintentional.

## 2017-04-06 NOTE — Clinical Social Work Placement (Signed)
   CLINICAL SOCIAL WORK PLACEMENT  NOTE  Date:  04/06/2017  Patient Details  Name: [name] MRN: [id number] Date of Birth: [birth year]  Clinical Social Work is seeking post-discharge placement for this patient at the Skilled  Nursing Facility level of care (*CSW will initial, date and re-position this form in  chart as items are completed):  Yes   Patient/family provided with [facility] Clinical Social Work Department's list of facilities offering this level of care within the geographic area requested by the patient (or if unable, by the patient's family).  Yes   Patient/family informed of their freedom to choose among providers that offer the needed level of care, that participate in Medicare, Medicaid or managed care program needed by the patient, have an available bed and are willing to accept the patient.  Yes   Patient/family informed of [facility]'s ownership interest in [facility] and [facility], as well as of the fact that they are under no obligation to receive care at these facilities.  PASRR submitted to EDS on 04/04/17     PASRR number received on 04/04/17     Existing PASRR number confirmed on       FL2 transmitted to all facilities in geographic area requested by pt/family on 04/04/17     FL2 transmitted to all facilities within larger geographic area on       Patient informed that his/her managed care company has contracts with or will negotiate with certain facilities, including the following:        Yes   Patient/family informed of bed offers received.  Patient chooses bed at ([facility] )     Physician recommends and patient chooses bed at      Patient to be transferred to ([facility] ) on 04/06/17.  Patient to be transferred to facility by [facility] )     Patient family notified on 04/06/17 of transfer.  Name of family member notified:  (Patient's daughter [name] is aware of D/C today. )     PHYSICIAN       Additional Comment:     _______________________________________________ Deon Ivey, Darleen Crocker, LCSW 04/06/2017, 11:16 AM

## 2017-04-06 NOTE — Progress Notes (Signed)
[facility] -  at [facility]   PATIENT NAME: [name]    MR#:  [id number]  DATE OF BIRTH:  01-21-1946  SUBJECTIVE:  CHIEF COMPLAINT:   Chief Complaint  Patient presents with  . Failure To Thrive   Came with progressive worsening in functional status, for last 3-4 months. Losing weight. Decreased appetite. His orthostasis on his leg is not fitting properly now because of shrinking the stump. He had a fall a few weeks ago and last 1 week he is very weak and not able to get up or move by himself. So brought to the emergency room. Noted to have UTI. Patient denies any new complaints today. Appears little better today, not eating much. REVIEW OF SYSTEMS:  CONSTITUTIONAL: No fever, positive for fatigue or weakness.  EYES: No blurred or double vision.  EARS, NOSE, AND THROAT: No tinnitus or ear pain.  RESPIRATORY: No cough, shortness of breath, wheezing or hemoptysis.  CARDIOVASCULAR: No chest pain, orthopnea, edema.  GASTROINTESTINAL: No nausea, vomiting, diarrhea or abdominal pain.  GENITOURINARY: No dysuria, hematuria.  ENDOCRINE: No polyuria, nocturia,  HEMATOLOGY: No anemia, easy bruising or bleeding SKIN: No rash or lesion. MUSCULOSKELETAL: No joint pain or arthritis.   NEUROLOGIC: No tingling, numbness, weakness.  PSYCHIATRY: No anxiety or depression.   ROS  DRUG ALLERGIES:  No Known Allergies  VITALS:  Blood pressure 134/64, pulse (!) 56, temperature 98.2 F (36.8 C), temperature source Oral, resp. rate 18, height 6' (1.829 m), weight 44.7 kg (98 lb 9.6 oz), SpO2 93 %.  PHYSICAL EXAMINATION:  GENERAL:  72 y.o.-year-old patient lying in the bed with no acute distress.  EYES: Pupils equal, round, reactive to light and accommodation. No scleral icterus. Extraocular muscles intact.  HEENT: Head atraumatic, normocephalic. Oropharynx and nasopharynx clear.  NECK:  Supple, no jugular venous distention. No thyroid enlargement, no tenderness.  LUNGS:  Normal breath sounds bilaterally, no wheezing, rales,rhonchi or crepitation. No use of accessory muscles of respiration.  CARDIOVASCULAR: S1, S2 normal. No murmurs, rubs, or gallops.  ABDOMEN: Soft, nontender, nondistended. Bowel sounds present. No organomegaly or mass.  EXTREMITIES: No pedal edema, cyanosis, or clubbing. Right-sided AKA amputation. NEUROLOGIC: Cranial nerves II through XII are intact. Muscle strength 4-5/5 in all extremities. Sensation intact. Gait not checked.  PSYCHIATRIC: The patient is alert and oriented x 3.  SKIN: No obvious rash, lesion, or ulcer.   Physical Exam LABORATORY PANEL:   CBC Recent Labs  Lab 04/03/17 1646  WBC 8.9  HGB 12.1*  HCT 36.1*  PLT 214   ------------------------------------------------------------------------------------------------------------------  Chemistries  Recent Labs  Lab 04/03/17 1646 04/05/17 0448  NA 134*  --   K 4.0  --   CL 97*  --   CO2 31  --   GLUCOSE 98  --   BUN 17  --   CREATININE 0.87  --   CALCIUM 8.0*  --   MG  --  1.7  AST 51*  --   ALT 45  --   ALKPHOS 96  --   BILITOT 0.7  --    ------------------------------------------------------------------------------------------------------------------  Cardiac Enzymes Recent Labs  Lab 04/03/17 1646  TROPONINI <0.03   ------------------------------------------------------------------------------------------------------------------  RADIOLOGY:  Mr Lumbar Spine Wo Contrast  Result Date: 04/04/2017 CLINICAL DATA:  72 y/o M; fall 3-4 weeks ago with continued discomfort and pain. EXAM: MRI LUMBAR SPINE WITHOUT CONTRAST TECHNIQUE: Multiplanar, multisequence MR imaging of the lumbar spine was performed. No intravenous contrast was administered. COMPARISON:  04/03/2017 lumbar  spine radiographs. FINDINGS: Segmentation:  Standard. Alignment:  Physiologic. Vertebrae: Chronic mild T12 compression deformity without edema. No bone marrow edema, findings of discitis,  or suspicious bone lesion. Large chronic L4 superior endplate Schmorl's node. Conus medullaris and cauda equina: Conus extends to the L2 level. Conus and cauda equina appear normal. Paraspinal and other soft tissues: Multiple well-circumscribed T2 hyperintense structures in the kidneys measuring up to 4 cm in left kidney upper pole are compatible with cysts. Infrarenal abdominal aortic aneurysm measuring 3.5 x 3.9 cm. Disc levels: L1-2: No significant disc displacement, foraminal stenosis, or canal stenosis. L2-3: No significant disc displacement, foraminal stenosis, or canal stenosis. L3-4: Small disc bulge with mild facet hypertrophy. Mild bilateral foraminal stenosis. No canal stenosis. L4-5: Small disc bulge and central annular fissure. Mild facet hypertrophy. Mild bilateral foraminal stenosis. No canal stenosis. L5-S1: No significant disc displacement, foraminal stenosis, or canal stenosis. IMPRESSION: 1. No acute osseous abnormality or malalignment. 2. Chronic T12 compression deformity with mild loss of height and chronic L4 superior endplate Schmorl's node. 3. Mild lumbar spine degenerative changes predominantly at the L3-4 and L4-5 levels. 4. Mild bilateral L3-4 and L4-5 foraminal stenosis. No canal stenosis. 5. 3.9 cm abdominal aortic aneurysm. Recommend followup by ultrasound in 2 years. This recommendation follows ACR consensus guidelines: White Paper of the ACR Incidental Findings Committee II on Vascular Findings. J Am Coll Radiol 2013; 10:789-794. Electronically Signed   By: Mitzi Hansen M.D.   On: 04/04/2017 20:58    ASSESSMENT AND PLAN:   Active Problems:   Acute cystitis  1.  Acute cystitis with hematuria.   has BPH.  Continue Flomax and add finasteride.  urine culture ,   Rocephin. 2.  Low back pain and unable to walk.   Fracture T12 x-rays of the sacrum and lumbar spine.  Patient having some pain over the sacral area.   orthopedic Consult,  Physical therapy evaluation    Appreciated. Help, MRI done which shows chronic fracture, no further interventions needed by them. 3.  Atrial fibrillation history with bradycardia.  Patient on Eliquis.  Hold  zebeta.  Continue amiodarone at 200 mg daily.  Check a digoxin level, decrease the dose of amiodarone and digitoxin both as patient had slight bradycardia 4.  Hyperlipidemia unspecified on Lipitor 5.  COPD with wheeze continue nebulizer treatments and inhalers 6.  History of orthostatic hypotension on midodrine 7.  Diarrhea.  Order stool studies, Campylobacter species found. 8.  Stage I decubiti with erythema on the buttock.  Duodenum ordered  Physical therapy and orthopedic evaluation. Spoke to his ex-wife in the room.   All the records are reviewed and case discussed with Care Management/Social Workerr. Management plans discussed with the patient, family and they are in agreement.  CODE STATUS: full  TOTAL TIME TAKING CARE OF THIS PATIENT: 35 minutes.    POSSIBLE D/C IN 1-2 DAYS, DEPENDING ON CLINICAL CONDITION.   Altamese Dilling M.D on 04/06/2017   Between 7am to 6pm - Pager - [phone]  After 6pm go to [email or web address] - password [email or web address]  [facility]  Office  [phone]  CC: Primary care physician; Center, [facility]  Note: This dictation was prepared with Dragon dictation along with smaller phrase technology. Any transcriptional errors that result from this process are unintentional.

## 2017-12-07 ENCOUNTER — Inpatient Hospital Stay (HOSPITAL_COMMUNITY): Payer: Medicare Other

## 2017-12-07 ENCOUNTER — Other Ambulatory Visit: Payer: Self-pay

## 2017-12-07 ENCOUNTER — Encounter (HOSPITAL_COMMUNITY): Payer: Self-pay | Admitting: Emergency Medicine

## 2017-12-07 ENCOUNTER — Emergency Department (HOSPITAL_COMMUNITY): Payer: Medicare Other

## 2017-12-07 ENCOUNTER — Inpatient Hospital Stay (HOSPITAL_COMMUNITY)
Admission: EM | Admit: 2017-12-07 | Discharge: 2017-12-13 | DRG: 199 | Disposition: A | Discharge status: Home Health | Payer: Medicare Other | Attending: Internal Medicine | Admitting: Internal Medicine

## 2017-12-07 DIAGNOSIS — F419 Anxiety disorder, unspecified: Secondary | ICD-10-CM | POA: Diagnosis present

## 2017-12-07 DIAGNOSIS — B1920 Unspecified viral hepatitis C without hepatic coma: Secondary | ICD-10-CM

## 2017-12-07 DIAGNOSIS — K5900 Constipation, unspecified: Secondary | ICD-10-CM | POA: Diagnosis not present

## 2017-12-07 DIAGNOSIS — Z681 Body mass index (BMI) 19 or less, adult: Secondary | ICD-10-CM

## 2017-12-07 DIAGNOSIS — I2510 Atherosclerotic heart disease of native coronary artery without angina pectoris: Secondary | ICD-10-CM | POA: Diagnosis present

## 2017-12-07 DIAGNOSIS — K7460 Unspecified cirrhosis of liver: Secondary | ICD-10-CM | POA: Diagnosis present

## 2017-12-07 DIAGNOSIS — I071 Rheumatic tricuspid insufficiency: Secondary | ICD-10-CM

## 2017-12-07 DIAGNOSIS — I493 Ventricular premature depolarization: Secondary | ICD-10-CM | POA: Diagnosis present

## 2017-12-07 DIAGNOSIS — E7800 Pure hypercholesterolemia, unspecified: Secondary | ICD-10-CM | POA: Diagnosis present

## 2017-12-07 DIAGNOSIS — I739 Peripheral vascular disease, unspecified: Secondary | ICD-10-CM | POA: Diagnosis present

## 2017-12-07 DIAGNOSIS — I5022 Chronic systolic (congestive) heart failure: Secondary | ICD-10-CM | POA: Diagnosis present

## 2017-12-07 DIAGNOSIS — I110 Hypertensive heart disease with heart failure: Secondary | ICD-10-CM | POA: Diagnosis present

## 2017-12-07 DIAGNOSIS — J9311 Primary spontaneous pneumothorax: Secondary | ICD-10-CM

## 2017-12-07 DIAGNOSIS — I480 Paroxysmal atrial fibrillation: Secondary | ICD-10-CM

## 2017-12-07 DIAGNOSIS — Z9114 Patient's other noncompliance with medication regimen: Secondary | ICD-10-CM

## 2017-12-07 DIAGNOSIS — F17211 Nicotine dependence, cigarettes, in remission: Secondary | ICD-10-CM

## 2017-12-07 DIAGNOSIS — J939 Pneumothorax, unspecified: Secondary | ICD-10-CM

## 2017-12-07 DIAGNOSIS — E785 Hyperlipidemia, unspecified: Secondary | ICD-10-CM | POA: Diagnosis present

## 2017-12-07 DIAGNOSIS — J9383 Other pneumothorax: Secondary | ICD-10-CM | POA: Diagnosis present

## 2017-12-07 DIAGNOSIS — N390 Urinary tract infection, site not specified: Secondary | ICD-10-CM | POA: Diagnosis present

## 2017-12-07 DIAGNOSIS — G8929 Other chronic pain: Secondary | ICD-10-CM

## 2017-12-07 DIAGNOSIS — J449 Chronic obstructive pulmonary disease, unspecified: Secondary | ICD-10-CM | POA: Diagnosis present

## 2017-12-07 DIAGNOSIS — R1310 Dysphagia, unspecified: Secondary | ICD-10-CM

## 2017-12-07 DIAGNOSIS — B961 Klebsiella pneumoniae [K. pneumoniae] as the cause of diseases classified elsewhere: Secondary | ICD-10-CM | POA: Diagnosis present

## 2017-12-07 DIAGNOSIS — E43 Unspecified severe protein-calorie malnutrition: Secondary | ICD-10-CM | POA: Diagnosis present

## 2017-12-07 DIAGNOSIS — Z9689 Presence of other specified functional implants: Secondary | ICD-10-CM

## 2017-12-07 DIAGNOSIS — K222 Esophageal obstruction: Secondary | ICD-10-CM

## 2017-12-07 DIAGNOSIS — K449 Diaphragmatic hernia without obstruction or gangrene: Secondary | ICD-10-CM | POA: Diagnosis present

## 2017-12-07 DIAGNOSIS — I482 Chronic atrial fibrillation, unspecified: Secondary | ICD-10-CM | POA: Diagnosis present

## 2017-12-07 DIAGNOSIS — R7689 Other specified abnormal immunological findings in serum: Secondary | ICD-10-CM

## 2017-12-07 DIAGNOSIS — K2980 Duodenitis without bleeding: Secondary | ICD-10-CM | POA: Diagnosis present

## 2017-12-07 DIAGNOSIS — M545 Low back pain: Secondary | ICD-10-CM

## 2017-12-07 DIAGNOSIS — K2990 Gastroduodenitis, unspecified, without bleeding: Secondary | ICD-10-CM

## 2017-12-07 DIAGNOSIS — Z8611 Personal history of tuberculosis: Secondary | ICD-10-CM | POA: Diagnosis not present

## 2017-12-07 DIAGNOSIS — Z4682 Encounter for fitting and adjustment of non-vascular catheter: Secondary | ICD-10-CM

## 2017-12-07 DIAGNOSIS — I4891 Unspecified atrial fibrillation: Secondary | ICD-10-CM

## 2017-12-07 DIAGNOSIS — I428 Other cardiomyopathies: Secondary | ICD-10-CM

## 2017-12-07 DIAGNOSIS — J439 Emphysema, unspecified: Secondary | ICD-10-CM

## 2017-12-07 DIAGNOSIS — Z89611 Acquired absence of right leg above knee: Secondary | ICD-10-CM | POA: Diagnosis not present

## 2017-12-07 DIAGNOSIS — R0602 Shortness of breath: Secondary | ICD-10-CM | POA: Diagnosis present

## 2017-12-07 DIAGNOSIS — I5020 Unspecified systolic (congestive) heart failure: Secondary | ICD-10-CM

## 2017-12-07 DIAGNOSIS — R011 Cardiac murmur, unspecified: Secondary | ICD-10-CM

## 2017-12-07 DIAGNOSIS — Z89511 Acquired absence of right leg below knee: Secondary | ICD-10-CM

## 2017-12-07 DIAGNOSIS — R768 Other specified abnormal immunological findings in serum: Secondary | ICD-10-CM

## 2017-12-07 DIAGNOSIS — F17210 Nicotine dependence, cigarettes, uncomplicated: Secondary | ICD-10-CM | POA: Diagnosis present

## 2017-12-07 DIAGNOSIS — K219 Gastro-esophageal reflux disease without esophagitis: Secondary | ICD-10-CM | POA: Diagnosis present

## 2017-12-07 DIAGNOSIS — R933 Abnormal findings on diagnostic imaging of other parts of digestive tract: Secondary | ICD-10-CM

## 2017-12-07 DIAGNOSIS — Z91120 Patient's intentional underdosing of medication regimen due to financial hardship: Secondary | ICD-10-CM

## 2017-12-07 DIAGNOSIS — K2970 Gastritis, unspecified, without bleeding: Secondary | ICD-10-CM

## 2017-12-07 DIAGNOSIS — Z09 Encounter for follow-up examination after completed treatment for conditions other than malignant neoplasm: Secondary | ICD-10-CM

## 2017-12-07 HISTORY — DX: Pneumothorax, unspecified: J93.9

## 2017-12-07 LAB — RAPID URINE DRUG SCREEN, HOSP PERFORMED
Amphetamines: NOT DETECTED
Barbiturates: NOT DETECTED
Benzodiazepines: NOT DETECTED
Cocaine: NOT DETECTED
Opiates: NOT DETECTED
Tetrahydrocannabinol: POSITIVE — AB

## 2017-12-07 LAB — URINALYSIS, ROUTINE W REFLEX MICROSCOPIC
Bilirubin Urine: NEGATIVE
GLUCOSE, UA: NEGATIVE mg/dL
KETONES UR: NEGATIVE mg/dL
Nitrite: POSITIVE — AB
Protein, ur: 100 mg/dL — AB
Specific Gravity, Urine: 1.013 (ref 1.005–1.030)
WBC, UA: >50 WBC/hpf — ABNORMAL HIGH (ref 0–5)
pH: 6 (ref 5.0–8.0)

## 2017-12-07 LAB — BRAIN NATRIURETIC PEPTIDE: B Natriuretic Peptide: 522.7 pg/mL — ABNORMAL HIGH (ref 0.0–100.0)

## 2017-12-07 LAB — CBC WITH DIFFERENTIAL/PLATELET
ABS IMMATURE GRANULOCYTES: 0.02 10*3/uL (ref 0.00–0.07)
Basophils Absolute: 0.1 10*3/uL (ref 0.0–0.1)
Basophils Relative: 1 %
Eosinophils Absolute: 0.4 10*3/uL (ref 0.0–0.5)
Eosinophils Relative: 5 %
HEMATOCRIT: 46.8 % (ref 39.0–52.0)
HEMOGLOBIN: 14 g/dL (ref 13.0–17.0)
Immature Granulocytes: 0 %
LYMPHS ABS: 2.5 10*3/uL (ref 0.7–4.0)
LYMPHS PCT: 32 %
MCH: 28.1 pg (ref 26.0–34.0)
MCHC: 29.9 g/dL — ABNORMAL LOW (ref 30.0–36.0)
MCV: 93.8 fL (ref 80.0–100.0)
MONO ABS: 1.1 10*3/uL — AB (ref 0.1–1.0)
MONOS PCT: 14 %
NEUTROS ABS: 3.8 10*3/uL (ref 1.7–7.7)
Neutrophils Relative %: 48 %
Platelets: 175 10*3/uL (ref 150–400)
RBC: 4.99 MIL/uL (ref 4.22–5.81)
RDW: 14.7 % (ref 11.5–15.5)
WBC: 7.9 10*3/uL (ref 4.0–10.5)
nRBC: 0 % (ref 0.0–0.2)

## 2017-12-07 LAB — COMPREHENSIVE METABOLIC PANEL
ALBUMIN: 3.5 g/dL (ref 3.5–5.0)
ALK PHOS: 109 U/L (ref 38–126)
ALT: 36 U/L (ref 0–44)
AST: 45 U/L — AB (ref 15–41)
Anion gap: 10 (ref 5–15)
BILIRUBIN TOTAL: 0.5 mg/dL (ref 0.3–1.2)
BUN: 18 mg/dL (ref 8–23)
CALCIUM: 8.8 mg/dL — AB (ref 8.9–10.3)
CO2: 24 mmol/L (ref 22–32)
Chloride: 104 mmol/L (ref 98–111)
Creatinine, Ser: 0.89 mg/dL (ref 0.61–1.24)
GFR calc Af Amer: >60 mL/min (ref >60)
GLUCOSE: 91 mg/dL (ref 70–99)
Potassium: 3.7 mmol/L (ref 3.5–5.1)
Sodium: 138 mmol/L (ref 135–145)
TOTAL PROTEIN: 7.3 g/dL (ref 6.5–8.1)

## 2017-12-07 LAB — PROTIME-INR
INR: 1.05
PROTHROMBIN TIME: 13.6 s (ref 11.4–15.2)

## 2017-12-07 LAB — TROPONIN I

## 2017-12-07 MED ORDER — MIDAZOLAM HCL 2 MG/2ML IJ SOLN
INTRAMUSCULAR | Status: AC | PRN
  Administered 2017-12-07: 0.5 mg via INTRAVENOUS
  Administered 2017-12-07: 1.5 mg via INTRAVENOUS

## 2017-12-07 MED ORDER — LIDOCAINE HCL 1 % IJ SOLN
INTRAMUSCULAR | Status: AC
  Filled 2017-12-07: qty 20

## 2017-12-07 MED ORDER — OXYCODONE HCL 5 MG PO TABS
5.0000 mg | ORAL_TABLET | ORAL | Status: DC | PRN
  Administered 2017-12-07 – 2017-12-13 (×21): 5 mg via ORAL
  Filled 2017-12-07 (×22): qty 1

## 2017-12-07 MED ORDER — MIDAZOLAM HCL 2 MG/2ML IJ SOLN
INTRAMUSCULAR | Status: AC
  Filled 2017-12-07: qty 4

## 2017-12-07 MED ORDER — ACETAMINOPHEN 650 MG RE SUPP: 650.0000 mg | Freq: Four times a day (QID) | RECTAL | Status: DC | PRN

## 2017-12-07 MED ORDER — FENTANYL CITRATE (PF) 100 MCG/2ML IJ SOLN
INTRAMUSCULAR | Status: AC
  Filled 2017-12-07: qty 2

## 2017-12-07 MED ORDER — ATORVASTATIN CALCIUM 40 MG PO TABS
40.0000 mg | ORAL_TABLET | Freq: Every day | ORAL | Status: DC
  Administered 2017-12-07 – 2017-12-13 (×7): 40 mg via ORAL
  Filled 2017-12-07 (×7): qty 1

## 2017-12-07 MED ORDER — METOPROLOL TARTRATE 12.5 MG HALF TABLET
12.5000 mg | ORAL_TABLET | Freq: Two times a day (BID) | ORAL | Status: DC
  Filled 2017-12-07: qty 1

## 2017-12-07 MED ORDER — FENTANYL CITRATE (PF) 100 MCG/2ML IJ SOLN
INTRAMUSCULAR | Status: AC | PRN
  Administered 2017-12-07 (×2): 50 ug via INTRAVENOUS

## 2017-12-07 MED ORDER — SENNA 8.6 MG PO TABS
1.0000 | ORAL_TABLET | Freq: Two times a day (BID) | ORAL | Status: DC
  Administered 2017-12-07 – 2017-12-13 (×8): 8.6 mg via ORAL
  Filled 2017-12-07 (×9): qty 1

## 2017-12-07 MED ORDER — SODIUM CHLORIDE 0.9% FLUSH
3.0000 mL | Freq: Two times a day (BID) | INTRAVENOUS | Status: DC
  Administered 2017-12-07 – 2017-12-13 (×13): 3 mL via INTRAVENOUS

## 2017-12-07 MED ORDER — ACETAMINOPHEN 325 MG PO TABS
650.0000 mg | ORAL_TABLET | Freq: Four times a day (QID) | ORAL | Status: DC | PRN
  Administered 2017-12-11 – 2017-12-12 (×3): 650 mg via ORAL
  Filled 2017-12-07 (×3): qty 2

## 2017-12-07 MED ORDER — SODIUM CHLORIDE 0.9 % IV SOLN
1.0000 g | INTRAVENOUS | Status: DC
  Administered 2017-12-07 – 2017-12-09 (×3): 1 g via INTRAVENOUS
  Filled 2017-12-07 (×4): qty 10

## 2017-12-07 NOTE — ED Notes (Signed)
Pt has been endorsing extreme hunger.  Resident states that pt should be NPO until we know if cardiothoracic surgery will come place chest tube.  Resident states that if they aren't doing this procedure pt may eat.  Spoke with EDP who requested that cardiothoracic surgery be re-paged.  Await call back.  Per EDP pt to remain NPO for chest tube placement.  Pt updated

## 2017-12-07 NOTE — Consult Note (Signed)
Chief Complaint: Patient was seen in consultation today for pneumothorax  Referring Physician(s): Dr. Cyndie Chime  Supervising Physician: Richarda Overlie  Patient Status: [facility] - In-pt  History of Present Illness: [name] is a 72 y.o. male with past medical history of CHF, cirrhosis, GERD, Hep C, and COPD who presented to [facility] ED with shortness of breath.  CXR revealed a large left-sided pneumothorax.  Patient has been evaluated by [facility] who felt patient to be high risk for surgical placement of tube due to granulomatous disease, lung anatomy, and severe COPD.  IR consulted for possible chest tube placement.   Case and imaging reviewed by Dr. Lowella Dandy who approves patient for procedure today.  Of note, patient is satting at 100% on room air.   Past Medical History:  Diagnosis Date  . Anxiety   . Arthritis    "hands" (11/29/2015)  . Chronic systolic CHF (congestive heart failure) (HCC)    a. 05/2015 Echo: EF 15-20%, diff HK.  Marland Kitchen Cirrhosis of liver (HCC)   . COPD (chronic obstructive pulmonary disease) (HCC)   . GERD (gastroesophageal reflux disease)   . Headache    "weekly, if that" (11/29/2015)  . 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 Hepatitis C   . High cholesterol   . History of blood transfusion 2000  . Hypertension   . Kidney stones    "alot"  . Marijuana abuse   . NICM (nonischemic cardiomyopathy) (HCC)    a. 05/2015 Echo: EF 15-20%, diff HK, mod MR, sev dil LA, mild TR, PASP ;  b. 05/2015 R/LHC: LM nl, LAD 30d, LCX nl, RCA 40ost, 42m, EF 20%.  . Non-obstructive CAD (coronary artery disease)    a. 05/2015 R/LHC: LM nl, LAD 30d, LCX nl, RCA 40ost, 9m, EF 20%.  . Paroxysmal atrial fibrillation (HCC)    a. 05/2015 - converted on amio. Also placed on Eliquis (CHA2DS2VASc = 3), but never took b/c he couldn't afford and didn't f/u @ [place].  Marland Kitchen Pneumonia 05/2015  . Pneumothorax 12/07/2017  . 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    . Tobacco abuse   . Tuberculosis 2007    Past Surgical History:  Procedure Laterality Date  . ABOVE KNEE LEG AMPUTATION Right 2000  . CARDIAC CATHETERIZATION N/A 06/25/2015   Procedure: Right/Left Heart Cath and Coronary Angiography;  Surgeon: Dolores Patty, MD;  Location: [facility] INVASIVE CV LAB;  Service: Cardiovascular;  Laterality: N/A;  . CATARACT EXTRACTION W/ INTRAOCULAR LENS  IMPLANT, BILATERAL Bilateral ~ 2003  . EXTRACORPOREAL SHOCK WAVE LITHOTRIPSY      Allergies: Patient has no known allergies.  Medications: Prior to Admission medications   Medication Sig Start Date End Date Taking? Authorizing Provider  apixaban (ELIQUIS) 5 MG TABS tablet Take 1 tablet (5 mg total) by mouth 2 (two) times daily. Patient not taking: Reported on 12/07/2017 10/09/16   Auburn Bilberry, MD  atorvastatin (LIPITOR) 40 MG tablet Take 1 tablet (40 mg total) by mouth daily at 6 PM. Patient not taking: Reported on 12/07/2017 10/09/16   Auburn Bilberry, MD  digoxin 62.5 MCG TABS Take 0.0625 mg by mouth daily. Patient not taking: Reported on 12/07/2017 04/07/17   Altamese Dilling, MD  finasteride (PROSCAR) 5 MG tablet Take 1 tablet (5 mg total) by mouth daily. Patient not taking: Reported on 12/07/2017 04/07/17   Altamese Dilling, MD  Ipratropium-Albuterol (COMBIVENT RESPIMAT) 20-100 MCG/ACT AERS respimat Inhale 1 puff into the lungs  every 6 (six) hours. Patient not taking: Reported on 12/07/2017 10/09/16   Auburn Bilberry, MD  midodrine (PROAMATINE) 5 MG tablet Take 1 tablet (5 mg total) by mouth 2 (two) times daily at 10 AM and 5 PM. Patient not taking: Reported on 12/07/2017 10/09/16   Auburn Bilberry, MD  mometasone-formoterol [facility]) 200-5 MCG/ACT AERO Inhale 2 puffs into the lungs 2 (two) times daily. Patient not taking: Reported on 04/03/2017 10/09/16   Auburn Bilberry, MD  Multiple Vitamin (MULTIVITAMIN WITH MINERALS) TABS tablet Take 1 tablet by mouth daily. Patient not taking: Reported  on 12/07/2017 04/07/17   Altamese Dilling, MD  vitamin C (VITAMIN C) 250 MG tablet Take 1 tablet (250 mg total) by mouth 2 (two) times daily. Patient not taking: Reported on 12/07/2017 04/06/17   Altamese Dilling, MD     Family History  Problem Relation Age of Onset  . Dementia Mother     Social History   Socioeconomic History  . Marital status: Divorced    Spouse name: Not on file  . Number of children: Not on file  . Years of education: Not on file  . Highest education level: Not on file  Occupational History  . Not on file  Social Needs  . Financial resource strain: Not on file  . Food insecurity:    Worry: Not on file    Inability: Not on file  . Transportation needs:    Medical: Not on file    Non-medical: Not on file  Tobacco Use  . Smoking status: Current Some Day Smoker    Years: 50.00    Types: Cigarettes  . Smokeless tobacco: Never Used  . Tobacco comment: used to smoke 2ppd but for past 10 yrs has been smoking 1 or less/day - usually just a few puffs off of someone else's cigarette  Substance and Sexual Activity  . Alcohol use: No    Comment: used to drink heavily - quit in 2000  . Drug use: Yes    Types: Marijuana    Comment: occasional marijuana cigarette  . Sexual activity: Not on file  Lifestyle  . Physical activity:    Days per week: Not on file    Minutes per session: Not on file  . Stress: Not on file  Relationships  . Social connections:    Talks on phone: Not on file    Gets together: Not on file    Attends religious service: Not on file    Active member of club or organization: Not on file    Attends meetings of clubs or organizations: Not on file    Relationship status: Not on file  Other Topics Concern  . Not on file  Social History Narrative   Lives in [place].  Does not routinely exercise.  Has not f/u with medical providers @ [place].  Does not take previously Rx medications.    Review of Systems: A 12 point ROS discussed and  pertinent positives are indicated in the HPI above.  All other systems are negative.  Review of Systems  Constitutional: Negative for fatigue and fever.  Respiratory: Positive for shortness of breath. Negative for cough.   Cardiovascular: Negative for chest pain.  Gastrointestinal: Negative for abdominal pain, diarrhea, nausea and vomiting.  Musculoskeletal: Negative for back pain.  Psychiatric/Behavioral: Negative for behavioral problems and confusion.    Vital Signs: BP 131/77   Pulse (!) 49   Temp 97.8 F (36.6 C) (Oral)   Resp (!) 28   SpO2  100%   Physical Exam  Constitutional: He is oriented to person, place, and time. He appears well-developed. No distress.  Cardiovascular: Normal rate, regular rhythm and normal heart sounds. Exam reveals no gallop and no friction rub.  No murmur heard. Pulmonary/Chest: Effort normal and breath sounds normal. No respiratory distress.  Abdominal: Soft. He exhibits no distension. There is no tenderness.  Neurological: He is alert and oriented to person, place, and time.  Skin: Skin is warm and dry. He is not diaphoretic.  Psychiatric: He has a normal mood and affect. His behavior is normal. Judgment and thought content normal.  Nursing note and vitals reviewed.    MD Evaluation Airway: WNL Heart: WNL Abdomen: WNL Chest/ Lungs: Other (comments) Chest/ lungs comments: COPD ASA  Classification: 3 Mallampati/Airway Score: One   Imaging: Dg Chest 2 View  Result Date: 12/07/2017 CLINICAL DATA:  Atrial fibrillation.  Shortness of breath. EXAM: CHEST - 2 VIEW COMPARISON:  October 07, 2016 FINDINGS: There is a sizable pneumothorax on the left, without tension component. There are scattered areas of scarring and calcification, likely due to old granulomatous disease. There is no edema or consolidation. Heart size is upper normal with pulmonary vascularity within normal limits. No adenopathy. No bone lesions. IMPRESSION: Sizable pneumothorax on  the left without tension component. Evidence of scarring and prior granulomatous disease changes with calcification. No edema or consolidation. Stable cardiac silhouette. Critical Value/emergent results were called by telephone at the time of interpretation on 12/07/2017 at 10:32 am to Dr. Benjiman Core , who verbally acknowledged these results. Electronically Signed   By: Bretta Bang III M.D.   On: 12/07/2017 10:33    Labs:  CBC: Recent Labs    04/03/17 1646 12/07/17 1007  WBC 8.9 7.9  HGB 12.1* 14.0  HCT 36.1* 46.8  PLT 214 175    COAGS: Recent Labs    12/07/17 1007  INR 1.05    BMP: Recent Labs    04/03/17 1646 12/07/17 1007  NA 134* 138  K 4.0 3.7  CL 97* 104  CO2 31 24  GLUCOSE 98 91  BUN 17 18  CALCIUM 8.0* 8.8*  CREATININE 0.87 0.89  GFRNONAA >60 >60  GFRAA >60 >60    LIVER FUNCTION TESTS: Recent Labs    04/03/17 1646 12/07/17 1007  BILITOT 0.7 0.5  AST 51* 45*  ALT 45 36  ALKPHOS 96 109  PROT 6.7 7.3  ALBUMIN 2.3* 3.5    TUMOR MARKERS: No results for input(s): AFPTM, CEA, CA199, CHROMGRNA in the last 8760 hours.  Assessment and Plan: Left Pneumothorax Patient with suspected spontaneous pneumothorax. IR consulted for chest tube placement. Case reviewed and approved by Dr. Lowella Dandy.  Patient has been NPO.  INR 1.05 Was taking Eliquis at home, but had stopped this on his own for several reasons.   Risks and benefits discussed with the patient including bleeding, infection, damage to adjacent structures. All of the patient's questions were answered, patient is agreeable to proceed. Consent signed and in chart.  Thank you for this interesting consult.  I greatly enjoyed meeting [name] and look forward to participating in their care.  A copy of this report was sent to the requesting provider on this date.  Electronically Signed: Hoyt Koch, PA 12/07/2017, 6:11 PM   I spent a total of 40 Minutes    in face to  face in clinical consultation, greater than 50% of which was counseling/coordinating care for left pneumothorax.

## 2017-12-07 NOTE — Procedures (Signed)
CT guided placement of 14 Fr drain in left pneumothorax.  Pneumothorax decreased after placement of drain with wall suction.  No blood loss and no immediate complication.  See full report in Imaging.

## 2017-12-07 NOTE — H&P (Signed)
Date: 12/07/2017               Patient Name:  [name] MRN: [id number]  DOB: Jul 13, 1945 Age / Sex: 72 y.o., male   PCP: [facility]         Medical Service: Internal Medicine Teaching Service         Attending Physician: Dr. Cyndie Chime, Genene Churn, MD    First Contact: Dr. Karilyn Cota Pager: [phone]  Second Contact: Dr. Mikey Bussing Pager: [phone]       After Hours (After 5p/  First Contact Pager: [phone]  weekends / holidays): Second Contact Pager: [phone]   Chief Complaint: Shortness of breath  History of Present Illness: [name] is a 72 y.o. gentleman with PMHx significant for A. fib, HFrEF,COPD, emphysema, hep C, Right AKA and treated TB brought to ED with worsening shortness of breath, found to be in A. fib with RVR and pneumothorax.  According to patient he stopped taking all of his medications around June 2019, once he ran out of them.  He is a [place] patient and cannot afford himself. According to him that he was unable to go to [place] because of transportation issues and has not seen his physicians for many months.  Patient has a right AKA and cannot drive himself.  He does not want to use via transportation stating that he has to spend the whole day there.  He was experiencing worsening cough with whitish sputum for the past 2 weeks, denies any sore throat or nasal congestion, for the past few days he developed worsening shortness of breath with orthopnea, was sleeping in recliner.  This morning his breathing got very worse so he called the EMS. EMS found him and A. fib with RVR, heart rate in 180s which improved after getting 5 mg of metoprolol. He denies any chest pain.  Denies any fever or chills or other upper respiratory symptoms except cough.  There is no recent change in his appetite or weight. He has a chronic lower back pain. He was also having increased urinary frequency, urgency and occasionally getting burning micturition for the past 2 weeks.  For  the past few nights he was getting up every 1-2 hour at night for urination, unable to sleep because of that.  ED course.  On presentation he was hemodynamically stable, having heart rate in low 100 and was little tachypneic, maintaining saturation on room air, chest x-ray with left pneumothorax.  Cardiothoracic surgery was consulted from ED for chest tube and internal medicine for admission.  Meds:  No outpatient medications have been marked as taking for the 12/07/17 encounter [facility] Encounter).    Allergies: Allergies as of 12/07/2017  . (No Known Allergies)   Past Medical History:  Diagnosis Date  . Anxiety   . Arthritis    "hands" (11/29/2015)  . Chronic systolic CHF (congestive heart failure) (HCC)    a. 05/2015 Echo: EF 15-20%, diff HK.  Marland Kitchen Cirrhosis of liver (HCC)   . COPD (chronic obstructive pulmonary disease) (HCC)   . GERD (gastroesophageal reflux disease)   . Headache    "weekly, if that" (11/29/2015)  . 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 Hepatitis C   . High cholesterol   . History of blood transfusion 2000  . Hypertension   . Kidney stones    "alot"  . Marijuana abuse   . NICM (nonischemic cardiomyopathy) (HCC)    a. 05/2015 Echo: EF 15-20%, diff HK, mod  MR, sev dil LA, mild TR, PASP ;  b. 05/2015 R/LHC: LM nl, LAD 30d, LCX nl, RCA 40ost, 33m, EF 20%.  . Non-obstructive CAD (coronary artery disease)    a. 05/2015 R/LHC: LM nl, LAD 30d, LCX nl, RCA 40ost, 5m, EF 20%.  . Paroxysmal atrial fibrillation (HCC)    a. 05/2015 - converted on amio. Also placed on Eliquis (CHA2DS2VASc = 3), but never took b/c he couldn't afford and didn't f/u @ [place].  Marland Kitchen Pneumonia 05/2015  . 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  . Tobacco abuse   . Tuberculosis 2007    Family History: No significant family history.  Social History: Lifelong smoker, quit this Tuesday after becoming more short of breath, used to smoke 1-2 pack/day.   Denies any alcohol or illicit drug use.  Review of Systems: A complete ROS was negative except as per HPI.   Physical Exam: Blood pressure 131/77, pulse (!) 49, temperature 97.8 F (36.6 C), temperature source Oral, resp. rate (!) 28, SpO2 100 %. Vitals:   12/07/17 1100 12/07/17 1200 12/07/17 1230 12/07/17 1300  BP: 129/90 (!) 122/105 (!) 133/92 131/77  Pulse:    (!) 49  Resp: (!) 30 (!) 28 18 (!) 28  Temp:      TempSrc:      SpO2: 97% 99% 100% 100%   General: Vital signs reviewed.  Patient is emaciated gentleman, in no acute distress and cooperative with exam.  Head: Normocephalic and atraumatic. Eyes: EOMI, conjunctivae normal, no scleral icterus.  Neck: Supple, trachea midline, normal ROM, no JVD, masses, thyromegaly, or carotid bruit present.  Cardiovascular: RRR, S1 normal, S2 normal, no murmurs, gallops, or rubs. Pulmonary/Chest: Decreased breath sounds at left base. Abdominal: Soft, non-tender, non-distended, BS +, no masses, organomegaly, or guarding present.  Extremities: No lower extremity edema bilaterally,  pulses symmetric and intact bilaterally. No cyanosis or clubbing. Neurological: A&O x3, Strength is normal and symmetric bilaterally, cranial nerve II-XII are grossly intact, no focal motor deficit, sensory intact to light touch bilaterally.  Skin: Warm, dry and intact.  Multiple ecchymosis bilaterally. Psychiatric: Normal mood and affect. speech and behavior is normal. Cognition and memory are normal.  EKG: personally reviewed my interpretation is atrial fibrillation with peak T waves.  CXR: personally reviewed my interpretation is left-sided pneumothorax.  Assessment & Plan by Problem: [name] is a 72 y.o. gentleman with PMHx significant for A. fib, HFrEF,COPD, emphysema, hep C, right AKA and treated TB brought to ED with worsening shortness of breath, found to be in A. fib with RVR and pneumothorax.  Active Problems:   Pneumothorax Most likely due to  bullae rupture as patient has an history of emphysema and COPD, recently having worsening cough with clear mucus. Initially cardiothoracic surgery was consulted from ED with no response, when we paged, Dr. Lowella Fairy who advised contacting IR for CT-guided pigtail catheter. -IR was consulted and order for CT chest placed.  A. Fib.  Heart rate currently in low 100s.  He was given metoprolol 5 mg by EMS.  We will hold onto more metoprolol at this time, will be started after chest tube placement if needed. Had CHA2DS2-VASc score of 3 which makes 3.2% stroke risk per year. Patient used to take Eliquis, stopped taking it partially due to cost, inability to go to [place] and because of excessive bruising. He needs a discussion regarding restarting Eliquis.  UTI.  Patient is having symptomatic  UTI with increased urinary frequency and urgency along with burning micturition.  Remained afebrile and no leukocytosis.  UA positive for many leukocytes, nitrates and bacteria. -Started him on ceftriaxone. -Urine culture.  HFrEF.  He is having nonischemic cardiomyopathy with ejection fraction of 30 to 35% on echo done in August 2018.  Had right and left heart cath in April 2017 which shows nonischemic cardiomyopathy. No sign of volume overload.  BNP above 500. Needs to be started on beta-blocker, ACE inhibitor or ARB if tolerated and a statin.  CODE STATUS.  Partial code, do not want intubation, CPR only for few minutes. DVT prophylaxis.  Will need anticoagulation after procedure. Diet.  Currently n.p.o. for procedure-heart healthy later on.  Dispo: Admit patient to Inpatient with expected length of stay greater than 2 midnights.  SignedArnetha Courser, MD 12/07/2017, 2:50 PM

## 2017-12-07 NOTE — Consult Note (Addendum)
Reason for Consult:spontaneous left pneumothorax Referring Physician: Internal medicine resident  [name] is an 72 y.o. male.  Chief complaint: Shortness of breath HPI: Patient is a 73 year old male with a significant history of COPD as well as multiple other medical comorbidities who presented to the emergency department with increasing symptoms of shortness of breath as well as tachycardia.  Chest x-ray has revealed a left-sided large pneumothorax.  There is no evidence of tension.  He has had no recent trauma. He did have a fall several months ago where he hurt spine and some ribs.  We are asked to see the patient in cardiothoracic surgical consultation to assist with management.  He previous scan done in 2017 showed findings consistent with granulomatous disease.  He is a long-term smoker with a greater than 50-year history and also uses marijuana.  He has not been taking his medications since approximately last June.  He reports that he has not been able to get back to the [place] where he gets his normal care.  He has had a cough associated with some sputum production as well.  Heart rate in transfer via EMS was in the 180-200 range.  He was given IV Lopressor and the rate did slow.He has had a previous chest tube for spont pneumothorax he believes was left side.   Past Medical History:  Diagnosis Date  . Anxiety   . Arthritis    "hands" (11/29/2015)  . Chronic systolic CHF (congestive heart failure) ([place])    a. 05/2015 Echo: EF 15-20%, diff HK.  Marland Kitchen Cirrhosis of liver ([place])   . COPD (chronic obstructive pulmonary disease) ([place])   . GERD (gastroesophageal reflux disease)   . Headache    "weekly, if that" (11/29/2015)  . Hepatic encephalopathy ([place]) 11/28/2015  . Hepatitis C   . High cholesterol   . History of blood transfusion 2000  . Hypertension   . Kidney stones    "alot"  . Marijuana abuse   . NICM (nonischemic cardiomyopathy) ([place])    a. 05/2015 Echo: EF 15-20%, diff HK, mod MR,  sev dil LA, mild TR, PASP 53mHg;  b. 05/2015 R/LHC: LM nl, LAD 30d, LCX nl, RCA 40ost, 352mEF 20%.  . Non-obstructive CAD (coronary artery disease)    a. 05/2015 R/LHC: LM nl, LAD 30d, LCX nl, RCA 40ost, 3077mF 20%.  . Paroxysmal atrial fibrillation ([place]  a. 05/2015 - converted on amio. Also placed on Eliquis (CHA2DS2VASc = 3), but never took b/c he couldn't afford and [facility]. PM[place]land Kitcheneumonia 05/2015  . Pneumothorax 12/07/2017  . PVD (peripheral vascular disease) ([place]  a. 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  . Tobacco abuse   . Tuberculosis 2007    Past Surgical History:  Procedure Laterality Date  . ABOVE KNEE LEG AMPUTATION Right 2000  . CARDIAC CATHETERIZATION N/A 06/25/2015   Procedure: Right/Left Heart Cath and Coronary Angiography;  Surgeon: DanJolaine ArtistD;  Location: [facility];  Service: Cardiovascular;  Laterality: N/A;  . CATARACT EXTRACTION W/ INTRAOCULAR LENS  IMPLANT, BILATERAL Bilateral ~ 2003  . EXTRACORPOREAL SHOCK WAVE LITHOTRIPSY      Family History  Problem Relation Age of Onset  . Dementia Mother     Social History:  reports that he has been smoking cigarettes. He has smoked for the past 50.00 years. He has never used smokeless tobacco. He reports that he has current or past drug history. Drug: Marijuana.  He reports that he does not drink alcohol.  Allergies: No Known Allergies  Medications:  Prior to Admission:  Medications Prior to Admission  Medication Sig Dispense Refill Last Dose  . apixaban (ELIQUIS) 5 MG TABS tablet Take 1 tablet (5 mg total) by mouth 2 (two) times daily. (Patient not taking: Reported on 12/07/2017) 60 tablet 0 Not Taking at Unknown time  . atorvastatin (LIPITOR) 40 MG tablet Take 1 tablet (40 mg total) by mouth daily at 6 PM. (Patient not taking: Reported on 12/07/2017) 30 tablet 3 Not Taking at Unknown time  . digoxin 62.5 MCG TABS Take 0.0625 mg by mouth daily. (Patient not taking: Reported on 12/07/2017) 30  tablet 0 Not Taking at Unknown time  . finasteride (PROSCAR) 5 MG tablet Take 1 tablet (5 mg total) by mouth daily. (Patient not taking: Reported on 12/07/2017) 30 tablet 0 Not Taking at Unknown time  . Ipratropium-Albuterol (COMBIVENT RESPIMAT) 20-100 MCG/ACT AERS respimat Inhale 1 puff into the lungs every 6 (six) hours. (Patient not taking: Reported on 12/07/2017) 1 Inhaler 0 Not Taking at Unknown time  . midodrine (PROAMATINE) 5 MG tablet Take 1 tablet (5 mg total) by mouth 2 (two) times daily at 10 AM and 5 PM. (Patient not taking: Reported on 12/07/2017) 60 tablet 0 Not Taking at Unknown time  . mometasone-formoterol (DULERA) 200-5 MCG/ACT AERO Inhale 2 puffs into the lungs 2 (two) times daily. (Patient not taking: Reported on 04/03/2017) 1 Inhaler 1 Not Taking at Unknown time  . Multiple Vitamin (MULTIVITAMIN WITH MINERALS) TABS tablet Take 1 tablet by mouth daily. (Patient not taking: Reported on 12/07/2017) 30 tablet 0 Not Taking at Unknown time  . vitamin C (VITAMIN C) 250 MG tablet Take 1 tablet (250 mg total) by mouth 2 (two) times daily. (Patient not taking: Reported on 12/07/2017) 60 tablet 0 Not Taking at Unknown time    Results for orders placed or performed during the hospital encounter of 12/07/17 (from the past 48 hour(s))  Brain natriuretic peptide     Status: Abnormal   Collection Time: 12/07/17  9:37 AM  Result Value Ref Range   B Natriuretic Peptide 522.7 (H) 0.0 - 100.0 pg/mL    Comment: Performed at [facility], [address], [place], [place] [address]  Comprehensive metabolic panel     Status: Abnormal   Collection Time: 12/07/17 10:07 AM  Result Value Ref Range   Sodium 138 135 - 145 mmol/L   Potassium 3.7 3.5 - 5.1 mmol/L   Chloride 104 98 - 111 mmol/L   CO2 24 22 - 32 mmol/L   Glucose, Bld 91 70 - 99 mg/dL   BUN 18 8 - 23 mg/dL   Creatinine, Ser 0.89 0.61 - 1.24 mg/dL   Calcium 8.8 (L) 8.9 - 10.3 mg/dL   Total Protein 7.3 6.5 - 8.1 g/dL   Albumin 3.5 3.5  - 5.0 g/dL   AST 45 (H) 15 - 41 U/L   ALT 36 0 - 44 U/L   Alkaline Phosphatase 109 38 - 126 U/L   Total Bilirubin 0.5 0.3 - 1.2 mg/dL   GFR calc non Af Amer >60 >60 mL/min   GFR calc Af Amer >60 >60 mL/min    Comment: (NOTE) The eGFR has been calculated using the CKD EPI equation. This calculation has not been validated in all clinical situations. eGFR's persistently <60 mL/min signify possible Chronic Kidney Disease.    Anion gap 10 5 - 15  Comment: Performed at [facility], [place] [address], [place], [place] [address]  Troponin I     Status: None   Collection Time: 12/07/17 10:07 AM  Result Value Ref Range   Troponin I <0.03 <0.03 ng/mL    Comment: Performed at [facility] [address] [place], [place] [address]  CBC with Differential     Status: Abnormal   Collection Time: 12/07/17 10:07 AM  Result Value Ref Range   WBC 7.9 4.0 - 10.5 K/uL   RBC 4.99 4.22 - 5.81 MIL/uL   Hemoglobin 14.0 13.0 - 17.0 g/dL   HCT 46.8 39.0 - 52.0 %   MCV 93.8 80.0 - 100.0 fL   MCH 28.1 26.0 - 34.0 pg   MCHC 29.9 (L) 30.0 - 36.0 g/dL   RDW 14.7 11.5 - 15.5 %   Platelets 175 150 - 400 K/uL   nRBC 0.0 0.0 - 0.2 %   Neutrophils Relative % 48 %   Neutro Abs 3.8 1.7 - 7.7 K/uL   Lymphocytes Relative 32 %   Lymphs Abs 2.5 0.7 - 4.0 K/uL   Monocytes Relative 14 %   Monocytes Absolute 1.1 (H) 0.1 - 1.0 K/uL   Eosinophils Relative 5 %   Eosinophils Absolute 0.4 0.0 - 0.5 K/uL   Basophils Relative 1 %   Basophils Absolute 0.1 0.0 - 0.1 K/uL   Immature Granulocytes 0 %   Abs Immature Granulocytes 0.02 0.00 - 0.07 K/uL    Comment: Performed at [facility] [address] [place], [place] [address]  Protime-INR     Status: None   Collection Time: 12/07/17 10:07 AM  Result Value Ref Range   Prothrombin Time 13.6 11.4 - 15.2 seconds   INR 1.05     Comment: Performed at [facility] [address], [place], [place] [address]  Urinalysis, Routine w reflex microscopic      Status: Abnormal   Collection Time: 12/07/17 11:18 AM  Result Value Ref Range   Color, Urine YELLOW YELLOW   APPearance CLOUDY (A) CLEAR   Specific Gravity, Urine 1.013 1.005 - 1.030   pH 6.0 5.0 - 8.0   Glucose, UA NEGATIVE NEGATIVE mg/dL   Hgb urine dipstick LARGE (A) NEGATIVE   Bilirubin Urine NEGATIVE NEGATIVE   Ketones, ur NEGATIVE NEGATIVE mg/dL   Protein, ur 100 (A) NEGATIVE mg/dL   Nitrite POSITIVE (A) NEGATIVE   Leukocytes, UA LARGE (A) NEGATIVE   RBC / HPF 21-50 0 - 5 RBC/hpf   WBC, UA >50 (H) 0 - 5 WBC/hpf   Bacteria, UA MANY (A) NONE SEEN   WBC Clumps PRESENT     Comment: Performed at [facility] [address] [place], [place] [address]    Dg Chest 2 View  Result Date: 12/07/2017 CLINICAL DATA:  Atrial fibrillation.  Shortness of breath. EXAM: CHEST - 2 VIEW COMPARISON:  October 07, 2016 FINDINGS: There is a sizable pneumothorax on the left, without tension component. There are scattered areas of scarring and calcification, likely due to old granulomatous disease. There is no edema or consolidation. Heart size is upper normal with pulmonary vascularity within normal limits. No adenopathy. No bone lesions. IMPRESSION: Sizable pneumothorax on the left without tension component. Evidence of scarring and prior granulomatous disease changes with calcification. No edema or consolidation. Stable cardiac silhouette. Critical Value/emergent results were called by telephone at the time of interpretation on 12/07/2017 at 10:32 am to Dr. Ovid Curd  PICKERING , who verbally acknowledged these results. Electronically Signed   By: Lowella Grip III M.D.   On: 12/07/2017 10:33    Review of Systems  Constitutional: Negative for chills, diaphoresis, fever, malaise/fatigue and weight loss.  HENT: Positive for hearing loss.   Eyes: Negative.   Respiratory: Positive for cough, sputum production, shortness of breath and wheezing. Negative for hemoptysis.   Cardiovascular: Positive  for palpitations and orthopnea. Negative for chest pain, claudication, leg swelling and PND.  Gastrointestinal: Positive for abdominal pain and constipation. Negative for blood in stool, diarrhea, heartburn, melena, nausea and vomiting.  Genitourinary: Positive for frequency and urgency. Negative for dysuria, flank pain and hematuria.  Musculoskeletal: Positive for back pain, falls, joint pain, myalgias and neck pain.  Skin: Negative for itching and rash.  Neurological: Positive for dizziness, tingling and sensory change. Negative for tremors, speech change, focal weakness, seizures, loss of consciousness, weakness and headaches.  Endo/Heme/Allergies: Negative for environmental allergies and polydipsia. Bruises/bleeds easily.  Psychiatric/Behavioral: Positive for memory loss. Negative for depression, hallucinations, substance abuse and suicidal ideas. The patient is not nervous/anxious and does not have insomnia.       Blood pressure 131/77, pulse (!) 49, temperature 97.8 F (36.6 C), temperature source Oral, resp. rate (!) 28, SpO2 100 %.    Physical Exam  Constitutional: He is oriented to person, place, and time. No distress.  Cachectic appearing, disheveled  HENT:  Head: Normocephalic.  Mouth/Throat: Oropharynx is clear and moist. No oropharyngeal exudate.  Poor dentition with only a few teeth  Eyes: Pupils are equal, round, and reactive to light. Conjunctivae and EOM are normal. Right eye exhibits no discharge. Left eye exhibits no discharge. No scleral icterus.  Neck: No JVD present. No tracheal deviation present. No thyromegaly present.  Cardiovascular: Regular rhythm. Exam reveals no gallop and no friction rub.  No murmur heard. IRRR, tachy Right AKA Pulses intact left foot  Respiratory: Effort normal. No stridor. He has no wheezes. He has no rales. He exhibits no tenderness.  Coarse and diminished throughout  GI: Soft. Bowel sounds are normal. He exhibits no distension and no  mass. There is no tenderness. There is no rebound and no guarding.  Musculoskeletal: He exhibits no edema or tenderness.  Lymphadenopathy:    He has no cervical adenopathy.  Neurological: He is alert and oriented to person, place, and time. He has normal reflexes.  Skin: Skin is warm and dry. No rash noted. He is not diaphoretic. No erythema. No pallor.  Psychiatric: He has a normal mood and affect. His behavior is normal. Judgment and thought content normal.    Assessment/Plan: Spontaneous left pneumothorax, rec IR to place tube with CT guidance d/t severe COPD, granulomatous dz and complexity of lung anatomy.   Medical management per primary service  [name] 12/07/2017, 3:08 PM  Dg Chest 2 View  Result Date: 12/07/2017 CLINICAL DATA:  Atrial fibrillation.  Shortness of breath. EXAM: CHEST - 2 VIEW COMPARISON:  October 07, 2016 FINDINGS: There is a sizable pneumothorax on the left, without tension component. There are scattered areas of scarring and calcification, likely due to old granulomatous disease. There is no edema or consolidation. Heart size is upper normal with pulmonary vascularity within normal limits. No adenopathy. No bone lesions. IMPRESSION: Sizable pneumothorax on the left without tension component. Evidence of scarring and prior granulomatous disease changes with calcification. No edema or consolidation. Stable cardiac silhouette. Critical Value/emergent results were called by telephone at the time of  interpretation on 12/07/2017 at 10:32 am to Dr. Davonna Belling , who verbally acknowledged these results. Electronically Signed   By: Lowella Grip III M.D.   On: 12/07/2017 10:33   Ct Chest Wo Contrast  Result Date: 12/07/2017 CLINICAL DATA:  72 year old male with history of shortness of breath and tachycardia. Known history of pneumothorax. EXAM: CT CHEST WITHOUT CONTRAST TECHNIQUE: Multidetector CT imaging of the chest was performed following the standard protocol  without IV contrast. COMPARISON:  Chest CT 06/23/2015. FINDINGS: Cardiovascular: Heart size is normal. There is no significant pericardial fluid, thickening or pericardial calcification. There is aortic atherosclerosis, as well as atherosclerosis of the great vessels of the mediastinum and the coronary arteries, including calcified atherosclerotic plaque in the left main, left anterior descending, left circumflex and right coronary arteries. Mediastinum/Nodes: No pathologically enlarged mediastinal or hilar lymph nodes. Please note that accurate exclusion of hilar adenopathy is limited on noncontrast CT scans. Esophagus is unremarkable in appearance. No axillary lymphadenopathy. Lungs/Pleura: Large left-sided pneumothorax which is predominantly subpulmonic. There several pleural adhesions throughout the mid to upper left hemithorax which prevent complete collapse of the left lung. No right pneumothorax. Diffuse bronchial wall thickening with moderate to severe centrilobular and paraseptal emphysema. Patchy multifocal peribronchovascular predominant nodularity most evident throughout the mid to upper lungs, with many of these densely calcified, likely to reflect sequela of old granulomatous disease. Extensive bronchiectasis, focal thickening of the peribronchovascular interstitium and chronic volume loss in the left upper lobe extending to the apex, most compatible with an area of post infectious or inflammatory scarring. No acute consolidative airspace disease. No pleural effusions. Upper Abdomen: 1.2 cm low-attenuation lesion in segment 4A of the liver, incompletely characterized on today's noncontrast CT examination, but statistically likely a cyst. 4.4 cm low-attenuation lesion in the upper pole of the left kidney, incompletely characterized on today's noncontrast CT examination, but statistically likely a cyst. 3 mm nonobstructive calculus in the upper pole collecting system of left kidney. Aortic  atherosclerosis. Musculoskeletal: Chronic compression fractures of T8, T12 and L1, most severe at L1 where there is 20% loss of anterior vertebral body height. There are no aggressive appearing lytic or blastic lesions noted in the visualized portions of the skeleton. IMPRESSION: 1. Large left-sided pneumothorax, predominantly subpulmonic in position with extensive pleural adhesions throughout the left hemithorax. The largest volume of left pneumothorax is in the subpulmonic position. 2. Sequela of old granulomatous disease in the lungs and extensive post infectious or inflammatory scarring, as above. 3. Diffuse bronchial wall thickening with moderate to severe centrilobular and paraseptal emphysema; imaging findings suggestive of underlying COPD. 4. Aortic atherosclerosis, in addition to left main and 3 vessel coronary artery disease. Assessment for potential risk factor modification, dietary therapy or pharmacologic therapy may be warranted, if clinically indicated. 5. 3 mm nonobstructive calculus in the upper pole collecting system of left kidney. 6. Additional incidental findings, as above. Aortic Atherosclerosis (ICD10-I70.0) and Emphysema (ICD10-J43.9). Electronically Signed   By: Vinnie Langton M.D.   On: 12/07/2017 19:19   Left chest tube placed CT directed due to the patient's subpulmonic position and extensive pleural adhesions throughout the left hemothorax I have seen and examined Mariana Kaufman and agree with the above assessment  and plan.  Grace Isaac MD [facility] [phone] Office [phone] 12/07/2017 8:56 PM

## 2017-12-07 NOTE — ED Triage Notes (Signed)
Pt here from home with c/o afib with rvr, heart rate was around 180 to 200 pt received 5 mg of lopressor and rate down to the 80's and 90's , pt stopped taking his meds around 5 months ago

## 2017-12-07 NOTE — ED Provider Notes (Addendum)
[facility] EMERGENCY DEPARTMENT Provider Note   CSN: [id number] Arrival date & time: 12/07/17  0175     History   Chief Complaint Chief Complaint  Patient presents with  . Tachycardia    HPI [name] is a 72 y.o. male.  HPI Patient presents with shortness of breath and fast heart rate.  States he has been doing bad for the last couple weeks.  Had a fast heart rate today.  History of A. fib CHF COPD.  Has been off his medication since probably June.  States he has not had a way to get to the [place].  States he has had cough with some sputum production.  Does bruise easily.  No fevers.  Has had some difficulty urinating also.  States with all his coughing he has some lower back pain also.  For EMS reportedly had a heart rate of 180-200.  Given 5 of Lopressor and rate has now slowed. Past Medical History:  Diagnosis Date  . Anxiety   . Arthritis    "hands" (11/29/2015)  . Chronic systolic CHF (congestive heart failure) (HCC)    a. 05/2015 Echo: EF 15-20%, diff HK.  Marland Kitchen Cirrhosis of liver (HCC)   . COPD (chronic obstructive pulmonary disease) (HCC)   . GERD (gastroesophageal reflux disease)   . Headache    "weekly, if that" (11/29/2015)  . 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 Hepatitis C   . High cholesterol   . History of blood transfusion 2000  . Hypertension   . Kidney stones    "alot"  . Marijuana abuse   . NICM (nonischemic cardiomyopathy) (HCC)    a. 05/2015 Echo: EF 15-20%, diff HK, mod MR, sev dil LA, mild TR, PASP ;  b. 05/2015 R/LHC: LM nl, LAD 30d, LCX nl, RCA 40ost, 83m, EF 20%.  . Non-obstructive CAD (coronary artery disease)    a. 05/2015 R/LHC: LM nl, LAD 30d, LCX nl, RCA 40ost, 34m, EF 20%.  . Paroxysmal atrial fibrillation (HCC)    a. 05/2015 - converted on amio. Also placed on Eliquis (CHA2DS2VASc = 3), but never took b/c he couldn't afford and didn't f/u @ [place].  Marland Kitchen Pneumonia 05/2015  . 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  . Tobacco abuse   . Tuberculosis 2007    Patient Active Problem List   Diagnosis Date Noted  . Pneumothorax 12/07/2017  . Acute cystitis 04/03/2017  . GERD (gastroesophageal reflux disease) 10/05/2016  . Hepatic cirrhosis (HCC) 12/02/2015  . Pressure injury of skin 12/01/2015  . COPD (chronic obstructive pulmonary disease) (HCC) 12/01/2015  . PAD (peripheral artery disease) (HCC) 12/01/2015  . Encephalopathy 11/28/2015  . Lingular pneumonia   . Panlobular emphysema (HCC)   . Hepatitis C antibody test positive   . TB (tuberculosis), treated   . Acute on chronic systolic CHF (congestive heart failure) (HCC)   . Pressure ulcer 06/24/2015  . Protein-calorie malnutrition, severe 06/24/2015  . Atrial fibrillation with RVR (HCC) 06/23/2015    Past Surgical History:  Procedure Laterality Date  . ABOVE KNEE LEG AMPUTATION Right 2000  . CARDIAC CATHETERIZATION N/A 06/25/2015   Procedure: Right/Left Heart Cath and Coronary Angiography;  Surgeon: Dolores Patty, MD;  Location: [facility] INVASIVE CV LAB;  Service: Cardiovascular;  Laterality: N/A;  . CATARACT EXTRACTION W/ INTRAOCULAR LENS  IMPLANT, BILATERAL Bilateral ~ 2003  . EXTRACORPOREAL SHOCK WAVE LITHOTRIPSY  Home Medications    Prior to Admission medications   Medication Sig Start Date End Date Taking? Authorizing Provider  apixaban (ELIQUIS) 5 MG TABS tablet Take 1 tablet (5 mg total) by mouth 2 (two) times daily. Patient not taking: Reported on 12/07/2017 10/09/16   Auburn Bilberry, MD  atorvastatin (LIPITOR) 40 MG tablet Take 1 tablet (40 mg total) by mouth daily at 6 PM. Patient not taking: Reported on 12/07/2017 10/09/16   Auburn Bilberry, MD  digoxin 62.5 MCG TABS Take 0.0625 mg by mouth daily. Patient not taking: Reported on 12/07/2017 04/07/17   Altamese Dilling, MD  finasteride (PROSCAR) 5 MG tablet Take 1 tablet (5 mg total) by mouth daily. Patient not taking: Reported  on 12/07/2017 04/07/17   Altamese Dilling, MD  Ipratropium-Albuterol (COMBIVENT RESPIMAT) 20-100 MCG/ACT AERS respimat Inhale 1 puff into the lungs every 6 (six) hours. Patient not taking: Reported on 12/07/2017 10/09/16   Auburn Bilberry, MD  midodrine (PROAMATINE) 5 MG tablet Take 1 tablet (5 mg total) by mouth 2 (two) times daily at 10 AM and 5 PM. Patient not taking: Reported on 12/07/2017 10/09/16   Auburn Bilberry, MD  mometasone-formoterol [facility]) 200-5 MCG/ACT AERO Inhale 2 puffs into the lungs 2 (two) times daily. Patient not taking: Reported on 04/03/2017 10/09/16   Auburn Bilberry, MD  Multiple Vitamin (MULTIVITAMIN WITH MINERALS) TABS tablet Take 1 tablet by mouth daily. Patient not taking: Reported on 12/07/2017 04/07/17   Altamese Dilling, MD  vitamin C (VITAMIN C) 250 MG tablet Take 1 tablet (250 mg total) by mouth 2 (two) times daily. Patient not taking: Reported on 12/07/2017 04/06/17   Altamese Dilling, MD    Family History Family History  Problem Relation Age of Onset  . Dementia Mother     Social History Social History   Tobacco Use  . Smoking status: Former Smoker    Years: 50.00    Types: Cigarettes  . Smokeless tobacco: Never Used  . Tobacco comment: used to smoke 2ppd but for past 10 yrs has been smoking 1 or less/day - usually just a few puffs off of someone else's cigarette  Substance Use Topics  . Alcohol use: No    Comment: used to drink heavily - quit in 2000  . Drug use: Yes    Types: Marijuana    Comment: occasional marijuana cigarette     Allergies   Patient has no known allergies.   Review of Systems Review of Systems  Constitutional: Positive for appetite change, fatigue and unexpected weight change. Negative for fever.  HENT: Negative for congestion.   Respiratory: Positive for cough and shortness of breath.   Cardiovascular: Positive for palpitations. Negative for chest pain.  Gastrointestinal: Negative for abdominal pain.    Genitourinary: Positive for dysuria.  Musculoskeletal: Positive for back pain.  Skin: Negative for rash.  Neurological: Negative for weakness.  Psychiatric/Behavioral: Negative for confusion.     Physical Exam Updated Vital Signs BP 131/77   Pulse (!) 49   Temp 97.8 F (36.6 C) (Oral)   Resp (!) 28   SpO2 100%   Physical Exam  Constitutional: He appears well-developed.  HENT:  Head: Normocephalic.  Neck: Neck supple.  Cardiovascular:  Irregular rhythm.  Pulmonary/Chest:  Diffuse harsh breath sounds with wheezes at bases.  Abdominal: There is no tenderness.  Musculoskeletal: He exhibits no edema.  Previous right above-the-knee amputation.  Neurological: He is alert.  Skin: Skin is warm. Capillary refill takes less than 2 seconds.  Psychiatric: He  has a normal mood and affect.     ED Treatments / Results  Labs (all labs ordered are listed, but only abnormal results are displayed) Labs Reviewed  COMPREHENSIVE METABOLIC PANEL - Abnormal; Notable for the following components:      Result Value   Calcium 8.8 (*)    AST 45 (*)    All other components within normal limits  CBC WITH DIFFERENTIAL/PLATELET - Abnormal; Notable for the following components:   MCHC 29.9 (*)    Monocytes Absolute 1.1 (*)    All other components within normal limits  BRAIN NATRIURETIC PEPTIDE - Abnormal; Notable for the following components:   B Natriuretic Peptide 522.7 (*)    All other components within normal limits  URINALYSIS, ROUTINE W REFLEX MICROSCOPIC - Abnormal; Notable for the following components:   APPearance CLOUDY (*)    Hgb urine dipstick LARGE (*)    Protein, ur 100 (*)    Nitrite POSITIVE (*)    Leukocytes, UA LARGE (*)    WBC, UA >50 (*)    Bacteria, UA MANY (*)    All other components within normal limits  URINE CULTURE  TROPONIN I  PROTIME-INR  RAPID URINE DRUG SCREEN, HOSP PERFORMED    EKG EKG Interpretation  Date/Time:  Friday December 07 2017 09:29:49  EDT Ventricular Rate:  101 PR Interval:    QRS Duration: 99 QT Interval:  396 QTC Calculation: 514 R Axis:   82 Text Interpretation:  Atrial fibrillation Borderline right axis deviation Probable left ventricular hypertrophy Prolonged QT interval Confirmed by Benjiman Core [phone]) on 12/07/2017 9:39:36 AM   Radiology Dg Chest 2 View  Result Date: 12/07/2017 CLINICAL DATA:  Atrial fibrillation.  Shortness of breath. EXAM: CHEST - 2 VIEW COMPARISON:  October 07, 2016 FINDINGS: There is a sizable pneumothorax on the left, without tension component. There are scattered areas of scarring and calcification, likely due to old granulomatous disease. There is no edema or consolidation. Heart size is upper normal with pulmonary vascularity within normal limits. No adenopathy. No bone lesions. IMPRESSION: Sizable pneumothorax on the left without tension component. Evidence of scarring and prior granulomatous disease changes with calcification. No edema or consolidation. Stable cardiac silhouette. Critical Value/emergent results were called by telephone at the time of interpretation on 12/07/2017 at 10:32 am to Dr. Benjiman Core , who verbally acknowledged these results. Electronically Signed   By: Bretta Bang III M.D.   On: 12/07/2017 10:33    Procedures Procedures (including critical care time)  Medications Ordered in ED Medications  sodium chloride flush (NS) 0.9 % injection 3 mL (3 mLs Intravenous Given 12/07/17 1304)  acetaminophen (TYLENOL) tablet 650 mg (has no administration in time range)    Or  acetaminophen (TYLENOL) suppository 650 mg (has no administration in time range)  senna (SENOKOT) tablet 8.6 mg (has no administration in time range)  atorvastatin (LIPITOR) tablet 40 mg (has no administration in time range)  cefTRIAXone (ROCEPHIN) 1 g in sodium chloride 0.9 % 100 mL IVPB (1 g Intravenous New Bag/Given 12/07/17 1308)  metoprolol tartrate (LOPRESSOR) tablet 12.5 mg (has no  administration in time range)     Initial Impression / Assessment and Plan / ED Course  I have reviewed the triage vital signs and the nursing notes.  Pertinent labs & imaging results that were available during my care of the patient were reviewed by me and considered in my medical decision making (see chart for details).    Chadsvasc of at least  3.(age,CHF,Vascular)   Patient with no shortness of breath.  Has had for the last 3 days.  Cough with some mild sputum production.  Has been off his medicines for a few months now.  Had A. fib RVR with EMS.  With 5 of metoprolol by EMS rate controlled but remains in A. fib.  However x-ray did find a left-sided pneumothorax.  Appears stable.  Not hypoxic.  Has had weight loss also.  With comorbidities A. fib and noncompliance I feel patient benefit from admission to the medicine service.  Primary is the [place].  Also will attempt to get a hold of cardiothoracic surgery for management of the pneumothorax.  After repeat paging and over 3 hours cardiothoracic surgery has not called back.  Final Clinical Impressions(s) / ED Diagnoses   Final diagnoses:  Atrial fibrillation with RVR (HCC)  Pneumothorax, unspecified type    ED Discharge Orders    None       Benjiman Core, MD 12/07/17 1131    Benjiman Core, MD 12/07/17 1325

## 2017-12-07 NOTE — Progress Notes (Addendum)
Discussed case with cardio thoracic surgery for chest tube placement. Dr. Tyrone Sage reviewed the case and recommended CT chest and a CT guided pigtail catheter placement by IR due to extensive lung scarring and risk of rupture. Ordered CT chest and consulted IR.

## 2017-12-07 NOTE — ED Notes (Signed)
Patient transported to X-ray 

## 2017-12-08 ENCOUNTER — Inpatient Hospital Stay (HOSPITAL_COMMUNITY): Payer: Medicare Other

## 2017-12-08 LAB — BASIC METABOLIC PANEL
ANION GAP: 8 (ref 5–15)
BUN: 24 mg/dL — AB (ref 8–23)
CHLORIDE: 104 mmol/L (ref 98–111)
CO2: 27 mmol/L (ref 22–32)
Calcium: 8.6 mg/dL — ABNORMAL LOW (ref 8.9–10.3)
Creatinine, Ser: 1 mg/dL (ref 0.61–1.24)
GFR calc Af Amer: >60 mL/min (ref >60)
GFR calc non Af Amer: >60 mL/min (ref >60)
GLUCOSE: 103 mg/dL — AB (ref 70–99)
POTASSIUM: 3.5 mmol/L (ref 3.5–5.1)
Sodium: 139 mmol/L (ref 135–145)

## 2017-12-08 LAB — GLUCOSE, CAPILLARY: GLUCOSE-CAPILLARY: 83 mg/dL (ref 70–99)

## 2017-12-08 MED ORDER — BENZONATATE 100 MG PO CAPS
100.0000 mg | ORAL_CAPSULE | Freq: Three times a day (TID) | ORAL | Status: DC | PRN
  Administered 2017-12-08 – 2017-12-11 (×5): 100 mg via ORAL
  Filled 2017-12-08 (×5): qty 1

## 2017-12-08 MED ORDER — ADULT MULTIVITAMIN W/MINERALS CH
1.0000 | ORAL_TABLET | Freq: Every day | ORAL | Status: DC
  Administered 2017-12-08 – 2017-12-13 (×6): 1 via ORAL
  Filled 2017-12-08 (×6): qty 1

## 2017-12-08 MED ORDER — ENOXAPARIN SODIUM 40 MG/0.4ML ~~LOC~~ SOLN
40.0000 mg | SUBCUTANEOUS | Status: DC
  Administered 2017-12-08 – 2017-12-13 (×6): 40 mg via SUBCUTANEOUS
  Filled 2017-12-08 (×6): qty 0.4

## 2017-12-08 MED ORDER — PRO-STAT SUGAR FREE PO LIQD
30.0000 mL | Freq: Two times a day (BID) | ORAL | Status: DC
  Administered 2017-12-08 – 2017-12-13 (×11): 30 mL via ORAL
  Filled 2017-12-08 (×11): qty 30

## 2017-12-08 NOTE — Progress Notes (Signed)
Referring Physician(s): Dr. Lyla Son   Supervising Physician: Richarda Overlie  Patient Status:  [facility] - In-pt  Chief Complaint: Pneumothorax  Subjective: Patient sitting up eating lunch at visit.  States his shortness of breath has improved.  Has [place] but no O2 flow, satting 96%.   Talks freely and easily without distress.  Pleurvac in place.   Allergies: Patient has no known allergies.  Medications: Prior to Admission medications   Medication Sig Start Date End Date Taking? Authorizing Provider  apixaban (ELIQUIS) 5 MG TABS tablet Take 1 tablet (5 mg total) by mouth 2 (two) times daily. Patient not taking: Reported on 12/07/2017 10/09/16   Auburn Bilberry, MD  atorvastatin (LIPITOR) 40 MG tablet Take 1 tablet (40 mg total) by mouth daily at 6 PM. Patient not taking: Reported on 12/07/2017 10/09/16   Auburn Bilberry, MD  digoxin 62.5 MCG TABS Take 0.0625 mg by mouth daily. Patient not taking: Reported on 12/07/2017 04/07/17   Altamese Dilling, MD  finasteride (PROSCAR) 5 MG tablet Take 1 tablet (5 mg total) by mouth daily. Patient not taking: Reported on 12/07/2017 04/07/17   Altamese Dilling, MD  Ipratropium-Albuterol (COMBIVENT RESPIMAT) 20-100 MCG/ACT AERS respimat Inhale 1 puff into the lungs every 6 (six) hours. Patient not taking: Reported on 12/07/2017 10/09/16   Auburn Bilberry, MD  midodrine (PROAMATINE) 5 MG tablet Take 1 tablet (5 mg total) by mouth 2 (two) times daily at 10 AM and 5 PM. Patient not taking: Reported on 12/07/2017 10/09/16   Auburn Bilberry, MD  mometasone-formoterol [facility]) 200-5 MCG/ACT AERO Inhale 2 puffs into the lungs 2 (two) times daily. Patient not taking: Reported on 04/03/2017 10/09/16   Auburn Bilberry, MD  Multiple Vitamin (MULTIVITAMIN WITH MINERALS) TABS tablet Take 1 tablet by mouth daily. Patient not taking: Reported on 12/07/2017 04/07/17   Altamese Dilling, MD  vitamin C (VITAMIN C) 250 MG tablet Take 1 tablet (250  mg total) by mouth 2 (two) times daily. Patient not taking: Reported on 12/07/2017 04/06/17   Altamese Dilling, MD     Vital Signs: BP 121/80 (BP Location: Right Arm)   Pulse 71   Temp 98.1 F (36.7 C) (Oral)   Resp (!) 23   Wt 95 lb 7.4 oz (43.3 kg)   SpO2 96%   BMI 12.95 kg/m   Physical Exam  NAD, alert Chest:  Drain in place.  Insertion site c/d/i. Pleurvac with 60 mL serous fluid. No air leak.  Lungs:  Notable congesting.   Imaging: Dg Chest 2 View  Result Date: 12/07/2017 CLINICAL DATA:  Atrial fibrillation.  Shortness of breath. EXAM: CHEST - 2 VIEW COMPARISON:  October 07, 2016 FINDINGS: There is a sizable pneumothorax on the left, without tension component. There are scattered areas of scarring and calcification, likely due to old granulomatous disease. There is no edema or consolidation. Heart size is upper normal with pulmonary vascularity within normal limits. No adenopathy. No bone lesions. IMPRESSION: Sizable pneumothorax on the left without tension component. Evidence of scarring and prior granulomatous disease changes with calcification. No edema or consolidation. Stable cardiac silhouette. Critical Value/emergent results were called by telephone at the time of interpretation on 12/07/2017 at 10:32 am to Dr. Benjiman Core , who verbally acknowledged these results. Electronically Signed   By: Bretta Bang III M.D.   On: 12/07/2017 10:33   Ct Chest Wo Contrast  Result Date: 12/07/2017 CLINICAL DATA:  72 year old male with history of shortness of breath and tachycardia. Known history of  pneumothorax. EXAM: CT CHEST WITHOUT CONTRAST TECHNIQUE: Multidetector CT imaging of the chest was performed following the standard protocol without IV contrast. COMPARISON:  Chest CT 06/23/2015. FINDINGS: Cardiovascular: Heart size is normal. There is no significant pericardial fluid, thickening or pericardial calcification. There is aortic atherosclerosis, as well as  atherosclerosis of the great vessels of the mediastinum and the coronary arteries, including calcified atherosclerotic plaque in the left main, left anterior descending, left circumflex and right coronary arteries. Mediastinum/Nodes: No pathologically enlarged mediastinal or hilar lymph nodes. Please note that accurate exclusion of hilar adenopathy is limited on noncontrast CT scans. Esophagus is unremarkable in appearance. No axillary lymphadenopathy. Lungs/Pleura: Large left-sided pneumothorax which is predominantly subpulmonic. There several pleural adhesions throughout the mid to upper left hemithorax which prevent complete collapse of the left lung. No right pneumothorax. Diffuse bronchial wall thickening with moderate to severe centrilobular and paraseptal emphysema. Patchy multifocal peribronchovascular predominant nodularity most evident throughout the mid to upper lungs, with many of these densely calcified, likely to reflect sequela of old granulomatous disease. Extensive bronchiectasis, focal thickening of the peribronchovascular interstitium and chronic volume loss in the left upper lobe extending to the apex, most compatible with an area of post infectious or inflammatory scarring. No acute consolidative airspace disease. No pleural effusions. Upper Abdomen: 1.2 cm low-attenuation lesion in segment 4A of the liver, incompletely characterized on today's noncontrast CT examination, but statistically likely a cyst. 4.4 cm low-attenuation lesion in the upper pole of the left kidney, incompletely characterized on today's noncontrast CT examination, but statistically likely a cyst. 3 mm nonobstructive calculus in the upper pole collecting system of left kidney. Aortic atherosclerosis. Musculoskeletal: Chronic compression fractures of T8, T12 and L1, most severe at L1 where there is 20% loss of anterior vertebral body height. There are no aggressive appearing lytic or blastic lesions noted in the visualized  portions of the skeleton. IMPRESSION: 1. Large left-sided pneumothorax, predominantly subpulmonic in position with extensive pleural adhesions throughout the left hemithorax. The largest volume of left pneumothorax is in the subpulmonic position. 2. Sequela of old granulomatous disease in the lungs and extensive post infectious or inflammatory scarring, as above. 3. Diffuse bronchial wall thickening with moderate to severe centrilobular and paraseptal emphysema; imaging findings suggestive of underlying COPD. 4. Aortic atherosclerosis, in addition to left main and 3 vessel coronary artery disease. Assessment for potential risk factor modification, dietary therapy or pharmacologic therapy may be warranted, if clinically indicated. 5. 3 mm nonobstructive calculus in the upper pole collecting system of left kidney. 6. Additional incidental findings, as above. Aortic Atherosclerosis (ICD10-I70.0) and Emphysema (ICD10-J43.9). Electronically Signed   By: Trudie Reed M.D.   On: 12/07/2017 19:19   Dg Chest Port 1 View  Result Date: 12/08/2017 CLINICAL DATA:  Pneumothorax.  Status post chest tube placement. EXAM: PORTABLE CHEST 1 VIEW COMPARISON:  CT chest 12/07/2017 FINDINGS: Heart is mildly enlarged. Pigtail catheter has been placed in the left pleural space. The left pneumothorax is markedly reduced, measuring less than 10%. Extensive chronic scarring is present bilaterally, particularly at the lung apices. IMPRESSION: 1. Reduction of left pneumothorax to less than 10% following placement of pigtail catheter. 2. Cardiomegaly without failure. 3. Otherwise stable chronic scarring. Electronically Signed   By: Marin Roberts M.D.   On: 12/08/2017 09:55   Ct Image Guided Fluid Drain By Catheter  Result Date: 12/07/2017 INDICATION: 72 year old with shortness of breath and spontaneous left pneumothorax. Patient needs a left chest tube. EXAM: CT-GUIDED PLACEMENT OF LEFT CHEST  TUBE MEDICATIONS: No antibiotics  were given for this procedure. ANESTHESIA/SEDATION: Fentanyl 100 mcg IV; Versed 2 mg IV Moderate Sedation Time:  20 minutes The patient was continuously monitored during the procedure by the interventional radiology nurse under my direct supervision. COMPLICATIONS: None immediate. PROCEDURE: Informed written consent was obtained from the patient after a thorough discussion of the procedural risks, benefits and alternatives. All questions were addressed. A timeout was performed prior to the initiation of the procedure. Patient was placed supine on the CT scanner. Images through the chest were obtained. Largest pleural air component is at the left lung base. This area was targeted for tube placement. Lateral left lower chest was prepped with chlorhexidine and sterile field was created. Maximal barrier sterile technique was utilized including caps, mask, sterile gowns, sterile gloves, sterile drape, hand hygiene and skin antiseptic. Skin was anesthetized with 1% lidocaine. Yueh catheter was directed into the pleural space in the left lower chest. Air was aspirated. A stiff Amplatz wire was placed. The tract was dilated to accommodate a 14 [place] multipurpose drain. The drain was easily advanced into the pleural space. Catheter was sutured to the skin. Drain was attached to the PleurEvac and wall suction. Follow up CT images were obtained after chest tube placement. FINDINGS: Large complex left pneumothorax. Largest air component was at the left chest base. Catheter was placed at the left lung base. Pneumothorax markedly decreased in size following chest tube placement. IMPRESSION: Successful CT-guided placement of a left chest tube. Electronically Signed   By: Richarda Overlie M.D.   On: 12/07/2017 21:39    Labs:  CBC: Recent Labs    04/03/17 1646 12/07/17 1007  WBC 8.9 7.9  HGB 12.1* 14.0  HCT 36.1* 46.8  PLT 214 175    COAGS: Recent Labs    12/07/17 1007  INR 1.05    BMP: Recent Labs     04/03/17 1646 12/07/17 1007 12/08/17 0327  NA 134* 138 139  K 4.0 3.7 3.5  CL 97* 104 104  CO2 31 24 27   GLUCOSE 98 91 103*  BUN 17 18 24*  CALCIUM 8.0* 8.8* 8.6*  CREATININE 0.87 0.89 1.00  GFRNONAA >60 >60 >60  GFRAA >60 >60 >60    LIVER FUNCTION TESTS: Recent Labs    04/03/17 1646 12/07/17 1007  BILITOT 0.7 0.5  AST 51* 45*  ALT 45 36  ALKPHOS 96 109  PROT 6.7 7.3  ALBUMIN 2.3* 3.5    Assessment and Plan: Pneumothorax s/p chest tube placement last PM  Patient feeling better this AM, however has not been up out of bed per his report.  Chest tube remains in place without issue.  Insertion site c/d/i.   CXR this AM showed: 1. Reduction of left pneumothorax to less than 10% following placement of pigtail catheter. 2. Cardiomegaly without failure. 3. Otherwise stable chronic scarring.  Keep to suction per TCTS. IR to follow.   Electronically Signed: Hoyt Koch, PA 12/08/2017, 12:12 PM   I spent a total of 15 Minutes at the the patient's bedside AND on the patient's hospital floor or unit, greater than 50% of which was counseling/coordinating care for pneumothorax.

## 2017-12-08 NOTE — Plan of Care (Signed)
  Problem: Education: Goal: Knowledge of General Education information will improve Description: Including pain rating scale, medication(s)/side effects and non-pharmacologic comfort measures Outcome: Progressing   Problem: Clinical Measurements: Goal: Will remain free from infection Outcome: Progressing Goal: Diagnostic test results will improve Outcome: Progressing   

## 2017-12-08 NOTE — Progress Notes (Signed)
[address]       [name] [address]             [phone]                     LOS: 1 day   Subjective: Says he will stop smoking, since he got very short of breath last week  Objective: Vital signs in last 24 hours: Patient Vitals for the past 24 hrs:  BP Temp Temp src Pulse Resp SpO2 Weight  12/08/17 0543 110/85 98.1 F (36.7 C) Oral 91 (!) 25 92 % 43.3 kg  12/08/17 0425 - - - 63 16 92 % -  12/08/17 0422 - - - 62 18 (!) 89 % -  12/07/17 2021 (!) 140/91 97.6 F (36.4 C) Oral 97 (!) 22 93 % -  12/07/17 1951 (!) 133/99 - - 76 (!) 22 99 % -  12/07/17 1935 128/88 - - 78 (!) 22 99 % -  12/07/17 1930 135/90 - - 82 (!) 25 99 % -  12/07/17 1925 (!) 147/83 - - 76 (!) 22 97 % -  12/07/17 1919 (!) 124/95 - - 73 (!) 26 97 % -  12/07/17 1847 (!) 138/98 - - 69 (!) 27 99 % -  12/07/17 1300 131/77 - - (!) 49 (!) 28 100 % -  12/07/17 1230 (!) 133/92 - - - 18 100 % -  12/07/17 1200 (!) 122/105 - - - (!) 28 99 % -  12/07/17 1100 129/90 - - - (!) 30 97 % -    Filed Weights   12/08/17 0543  Weight: 43.3 kg    Hemodynamic parameters for last 24 hours:    Intake/Output from previous day: 10/11 0701 - 10/12 0700 In: 1200 [P.O.:600; I.V.:500; IV Piggyback:100] Out: 150 [Urine:150] Intake/Output this shift: No intake/output data recorded.  Scheduled Meds: . atorvastatin  40 mg Oral q1800  . feeding supplement (PRO-STAT SUGAR FREE 64)  30 mL Oral BID  . multivitamin with minerals  1 tablet Oral Daily  . senna  1 tablet Oral BID  . sodium chloride flush  3 mL Intravenous Q12H   Continuous Infusions: . cefTRIAXone (ROCEPHIN)  IV Stopped (12/07/17 1353)   PRN Meds:.acetaminophen **OR** acetaminophen, oxyCODONE  General appearance: alert, cooperative, cachectic and no distress Neurologic: intact Heart: regular rate and rhythm, S1, S2 normal, no murmur, click, rub or gallop Lungs: diminished breath sounds bibasilar Abdomen: soft, non-tender; bowel sounds  normal; no masses,  no organomegaly Extremities: extremities normal, atraumatic, no cyanosis or edema and Homans sign is negative, no sign of DVT Wound: no air leak from chest tube   Lab Results: CBC: Recent Labs    12/07/17 1007  WBC 7.9  HGB 14.0  HCT 46.8  PLT 175   BMET:  Recent Labs    12/07/17 1007 12/08/17 0327  NA 138 139  K 3.7 3.5  CL 104 104  CO2 24 27  GLUCOSE 91 103*  BUN 18 24*  CREATININE 0.89 1.00  CALCIUM 8.8* 8.6*    PT/INR:  Recent Labs    12/07/17 1007  LABPROT 13.6  INR 1.05     Radiology Dg Chest 2 View  Result Date: 12/07/2017 CLINICAL DATA:  Atrial fibrillation.  Shortness of breath. EXAM: CHEST - 2 VIEW COMPARISON:  October 07, 2016 FINDINGS: There is a sizable pneumothorax on the left, without tension component. There are scattered areas of scarring and calcification,  likely due to old granulomatous disease. There is no edema or consolidation. Heart size is upper normal with pulmonary vascularity within normal limits. No adenopathy. No bone lesions. IMPRESSION: Sizable pneumothorax on the left without tension component. Evidence of scarring and prior granulomatous disease changes with calcification. No edema or consolidation. Stable cardiac silhouette. Critical Value/emergent results were called by telephone at the time of interpretation on 12/07/2017 at 10:32 am to Dr. Benjiman Core , who verbally acknowledged these results. Electronically Signed   By: Bretta Bang III M.D.   On: 12/07/2017 10:33   Ct Chest Wo Contrast  Result Date: 12/07/2017 CLINICAL DATA:  72 year old male with history of shortness of breath and tachycardia. Known history of pneumothorax. EXAM: CT CHEST WITHOUT CONTRAST TECHNIQUE: Multidetector CT imaging of the chest was performed following the standard protocol without IV contrast. COMPARISON:  Chest CT 06/23/2015. FINDINGS: Cardiovascular: Heart size is normal. There is no significant pericardial fluid, thickening  or pericardial calcification. There is aortic atherosclerosis, as well as atherosclerosis of the great vessels of the mediastinum and the coronary arteries, including calcified atherosclerotic plaque in the left main, left anterior descending, left circumflex and right coronary arteries. Mediastinum/Nodes: No pathologically enlarged mediastinal or hilar lymph nodes. Please note that accurate exclusion of hilar adenopathy is limited on noncontrast CT scans. Esophagus is unremarkable in appearance. No axillary lymphadenopathy. Lungs/Pleura: Large left-sided pneumothorax which is predominantly subpulmonic. There several pleural adhesions throughout the mid to upper left hemithorax which prevent complete collapse of the left lung. No right pneumothorax. Diffuse bronchial wall thickening with moderate to severe centrilobular and paraseptal emphysema. Patchy multifocal peribronchovascular predominant nodularity most evident throughout the mid to upper lungs, with many of these densely calcified, likely to reflect sequela of old granulomatous disease. Extensive bronchiectasis, focal thickening of the peribronchovascular interstitium and chronic volume loss in the left upper lobe extending to the apex, most compatible with an area of post infectious or inflammatory scarring. No acute consolidative airspace disease. No pleural effusions. Upper Abdomen: 1.2 cm low-attenuation lesion in segment 4A of the liver, incompletely characterized on today's noncontrast CT examination, but statistically likely a cyst. 4.4 cm low-attenuation lesion in the upper pole of the left kidney, incompletely characterized on today's noncontrast CT examination, but statistically likely a cyst. 3 mm nonobstructive calculus in the upper pole collecting system of left kidney. Aortic atherosclerosis. Musculoskeletal: Chronic compression fractures of T8, T12 and L1, most severe at L1 where there is 20% loss of anterior vertebral body height. There are  no aggressive appearing lytic or blastic lesions noted in the visualized portions of the skeleton. IMPRESSION: 1. Large left-sided pneumothorax, predominantly subpulmonic in position with extensive pleural adhesions throughout the left hemithorax. The largest volume of left pneumothorax is in the subpulmonic position. 2. Sequela of old granulomatous disease in the lungs and extensive post infectious or inflammatory scarring, as above. 3. Diffuse bronchial wall thickening with moderate to severe centrilobular and paraseptal emphysema; imaging findings suggestive of underlying COPD. 4. Aortic atherosclerosis, in addition to left main and 3 vessel coronary artery disease. Assessment for potential risk factor modification, dietary therapy or pharmacologic therapy may be warranted, if clinically indicated. 5. 3 mm nonobstructive calculus in the upper pole collecting system of left kidney. 6. Additional incidental findings, as above. Aortic Atherosclerosis (ICD10-I70.0) and Emphysema (ICD10-J43.9). Electronically Signed   By: Trudie Reed M.D.   On: 12/07/2017 19:19   Dg Chest Port 1 View  Result Date: 12/08/2017 CLINICAL DATA:  Pneumothorax.  Status post  chest tube placement. EXAM: PORTABLE CHEST 1 VIEW COMPARISON:  CT chest 12/07/2017 FINDINGS: Heart is mildly enlarged. Pigtail catheter has been placed in the left pleural space. The left pneumothorax is markedly reduced, measuring less than 10%. Extensive chronic scarring is present bilaterally, particularly at the lung apices. IMPRESSION: 1. Reduction of left pneumothorax to less than 10% following placement of pigtail catheter. 2. Cardiomegaly without failure. 3. Otherwise stable chronic scarring. Electronically Signed   By: Marin Roberts M.D.   On: 12/08/2017 09:55   Ct Image Guided Fluid Drain By Catheter  Result Date: 12/07/2017 INDICATION: 72 year old with shortness of breath and spontaneous left pneumothorax. Patient needs a left chest tube.  EXAM: CT-GUIDED PLACEMENT OF LEFT CHEST TUBE MEDICATIONS: No antibiotics were given for this procedure. ANESTHESIA/SEDATION: Fentanyl 100 mcg IV; Versed 2 mg IV Moderate Sedation Time:  20 minutes The patient was continuously monitored during the procedure by the interventional radiology nurse under my direct supervision. COMPLICATIONS: None immediate. PROCEDURE: Informed written consent was obtained from the patient after a thorough discussion of the procedural risks, benefits and alternatives. All questions were addressed. A timeout was performed prior to the initiation of the procedure. Patient was placed supine on the CT scanner. Images through the chest were obtained. Largest pleural air component is at the left lung base. This area was targeted for tube placement. Lateral left lower chest was prepped with chlorhexidine and sterile field was created. Maximal barrier sterile technique was utilized including caps, mask, sterile gowns, sterile gloves, sterile drape, hand hygiene and skin antiseptic. Skin was anesthetized with 1% lidocaine. Yueh catheter was directed into the pleural space in the left lower chest. Air was aspirated. A stiff Amplatz wire was placed. The tract was dilated to accommodate a 14 [place] multipurpose drain. The drain was easily advanced into the pleural space. Catheter was sutured to the skin. Drain was attached to the PleurEvac and wall suction. Follow up CT images were obtained after chest tube placement. FINDINGS: Large complex left pneumothorax. Largest air component was at the left chest base. Catheter was placed at the left lung base. Pneumothorax markedly decreased in size following chest tube placement. IMPRESSION: Successful CT-guided placement of a left chest tube. Electronically Signed   By: Richarda Overlie M.D.   On: 12/07/2017 21:39     Assessment/Plan: S/P chest tube placement  Leave chest tube to suction today   Delight Ovens MD 12/08/2017 10:26 AM     Patient  ID: [name], male   DOB: [birth year], 72 y.o.   MRN: [id number]

## 2017-12-08 NOTE — Progress Notes (Signed)
Initial Nutrition Assessment  DOCUMENTATION CODES:   Severe malnutrition in context of chronic illness, Underweight  INTERVENTION:  - Will order Magic Cup BID with meals, each supplement provides 290 kcal and 9 grams of protein. - Will order 30 mL Prostat BID, each supplement provides 100 kcal and 15 grams of protein. - Will order daily multivitamin with minerals.  - Continue to encourage PO intakes.   NUTRITION DIAGNOSIS:   Severe Malnutrition related to chronic illness(untreated hepatitis C, COPD) as evidenced by severe fat depletion, severe muscle depletion.  GOAL:   Patient will meet greater than or equal to 90% of their needs  MONITOR:   PO intake, Supplement acceptance, Weight trends, Labs  REASON FOR ASSESSMENT:   Malnutrition Screening Tool, Consult Assessment of nutrition requirement/status  ASSESSMENT:   72 year old [place] [employer] veteran with obstructive airway disease, diffuse emphysema in both lungs, PAF (refuses anticoagulation), PVD s/p R BKA, nonischemic cardiomyopathy, mildly decreased right ventricular function and moderate tricuspid regurgitation, untreated hepatitis C, previously treated TB, hx of/previous heavy alcohol use. He presented to the ED with increasing dyspnea over the last few days and was found to have a large left pneumothorax.  No intakes documented since admission. Visualized breakfast tray with 50% completion. Patient with very poor dentition and reports it is difficult for him to chew items, specifically meat, and that this sometimes causes him to choke. He denies swallowing difficulty with liquids; swallowing difficulty only arises 2/2 difficulty chewing/inability to adequately chew.   Patient denies abdominal pain or nausea this AM. He declines offer for Ensure supplements, stating they are too sweet. Will trial items listed above. Unable to obtain much PTA information from patient as he reports "blanking" on information d/t being so tired this  AM. He reports highest weight was 126 lb when he went to [employer] for the [employer]. Unable to obtain more recent weight information from patient. Chart review indicates weight has been stable since at least 09/2016.   Medications reviewed; 1 tablet Senokot BID. Labs reviewed;  CBG: 83 mg/dL this AM, BUN: 24 mg/dL, Ca: 8.3 mg/dL.    NUTRITION - FOCUSED PHYSICAL EXAM:    Most Recent Value  Orbital Region  Moderate depletion  Upper Arm Region  Severe depletion  Thoracic and Lumbar Region  Severe depletion  Buccal Region  Severe depletion  Temple Region  Unable to assess  Clavicle Bone Region  Severe depletion  Clavicle and Acromion Bone Region  Severe depletion  Scapular Bone Region  Unable to assess  Dorsal Hand  Severe depletion  Patellar Region  Unable to assess  Anterior Thigh Region  Unable to assess  Posterior Calf Region  Unable to assess  Edema (RD Assessment)  Unable to assess  Hair  Reviewed  Eyes  Reviewed  Mouth  Reviewed  Skin  Reviewed  Nails  Reviewed       Diet Order:   Diet Order            Diet Heart Room service appropriate? Yes; Fluid consistency: Thin  Diet effective now              EDUCATION NEEDS:   No education needs have been identified at this time  Skin:  Skin Assessment: Skin Integrity Issues: Skin Integrity Issues:: Incisions Incisions: L chest tube placement site (10/11)  Last BM:  10/10  Height:   Ht Readings from Last 1 Encounters:  04/03/17 6' (1.829 m)    Weight:   Wt Readings from Last 1  Encounters:  12/08/17 43.3 kg    Ideal Body Weight:  80.91 kg  BMI:  Body mass index is 12.95 kg/m.  Estimated Nutritional Needs:   Kcal:  1515-1730 (35-40 kcal/kg)  Protein:  65-75 grams  Fluid:  >/= 1.7 L/day     Trenton Gammon, MS, RD, LDN, [facility] Inpatient Clinical Dietitian Pager # [phone] After hours/weekend pager # [phone]

## 2017-12-08 NOTE — Progress Notes (Signed)
   Subjective: patient evaluated this morning.  Denies shortness of breath or difficulty breathing. Reports a cough. Denies fever or chills  Objective:  Vital signs in last 24 hours: Vitals:   12/07/17 2021 12/08/17 0422 12/08/17 0425 12/08/17 0543  BP: (!) 140/91   110/85  Pulse: 97 62 63 91  Resp: (!) 22 18 16  (!) 25  Temp: 97.6 F (36.4 C)   98.1 F (36.7 C)  TempSrc: Oral   Oral  SpO2: 93% (!) 89% 92% 92%  Weight:    43.3 kg   Physical Exam  Constitutional:  Thin appearing  Cardiovascular: Normal rate, regular rhythm and normal heart sounds. Exam reveals no gallop and no friction rub.  No murmur heard. Pulmonary/Chest: Effort normal and breath sounds normal. No respiratory distress. He has no wheezes.  Catheter in place on left side  Musculoskeletal: He exhibits no edema.  Right BKA  Skin: Skin is warm and dry.     Assessment/Plan:  Active Problems:   Hepatitis C antibody positive in blood   PVD (peripheral vascular disease) (HCC)   Pneumothorax   Nonischemic cardiomyopathy (HCC)   Chronic atrial fibrillation   S/P unilateral BKA (below knee amputation), right (HCC)  Recurrent Spontaneous secondary left pneumothorax  Hx of COPD, pneumothorax likely from a ruptured bleb. Patient had a pigtail catheter placed by IR with CT guidance on the left side 10/12. Pneumothorax decreased after placement of drain.  Patient was oxygenating well on room air today. Drain output minimal.  Follow up chest x-ray pending -  CVTS following - follow up chest x-ray  Urinary tract infection UA with positive nitrites and leukocytes as well as hemoglobin present with RBCs.  Treating currently with ceftriaxone. Urine culture pending.  Due to microscopic hematuria patient will need a UA at follow-up in 4-6 weeks to assess resolution of hematuria. -urine culture pending -IV ceftriaxone -UA follow-up in 4-6 weeks after discharge   Paroxysmal A. Fib Rates controlled not on medication. He  declines anticoagulation.  Will continue to follow and restart beta blocker if needed. -Monitor  Untreated hep C Plan [facility] outpatient [facility] History of tuberculosis status post treatment Peripheral vascular disease status post right BKA  Dispo: Anticipated discharge pending clinical improvement and discontinuation of chest tube.   Geralyn Corwin [place], DO 12/08/2017, 9:40 AM Pager: [phone]

## 2017-12-09 ENCOUNTER — Inpatient Hospital Stay (HOSPITAL_COMMUNITY): Payer: Medicare Other

## 2017-12-09 DIAGNOSIS — F458 Other somatoform disorders: Secondary | ICD-10-CM

## 2017-12-09 DIAGNOSIS — F419 Anxiety disorder, unspecified: Secondary | ICD-10-CM

## 2017-12-09 DIAGNOSIS — Z978 Presence of other specified devices: Secondary | ICD-10-CM

## 2017-12-09 DIAGNOSIS — B9689 Other specified bacterial agents as the cause of diseases classified elsewhere: Secondary | ICD-10-CM

## 2017-12-09 LAB — URINE CULTURE: Culture: >=100000 — AB

## 2017-12-09 LAB — GLUCOSE, CAPILLARY: Glucose-Capillary: 90 mg/dL (ref 70–99)

## 2017-12-09 MED ORDER — LORAZEPAM 2 MG/ML IJ SOLN
0.5000 mg | Freq: Once | INTRAMUSCULAR | Status: AC
  Administered 2017-12-09: 0.5 mg via INTRAVENOUS
  Filled 2017-12-09: qty 1

## 2017-12-09 NOTE — Plan of Care (Signed)
  Problem: Education: Goal: Knowledge of General Education information will improve Description Including pain rating scale, medication(s)/side effects and non-pharmacologic comfort measures Outcome: Progressing   Problem: Health Behavior/Discharge Planning: Goal: Ability to manage health-related needs will improve Outcome: Progressing   

## 2017-12-09 NOTE — Progress Notes (Addendum)
[address]       [name] [address]             [phone]         Subjective: Breathing is reasonably comfortable, some productive cough  Objective: Vital signs in last 24 hours: Temp:  [97.6 F (36.4 C)-97.8 F (36.6 C)] 97.7 F (36.5 C) (10/13 0444) Pulse Rate:  [71-75] 72 (10/13 0410) Cardiac Rhythm: Normal sinus rhythm (10/12 2035) Resp:  [17-23] 17 (10/13 0410) BP: (121-139)/(80-117) 139/87 (10/13 0410) SpO2:  [92 %-96 %] 92 % (10/13 0410) Weight:  [44.5 kg] 44.5 kg (10/13 0444)  Hemodynamic parameters for last 24 hours:    Intake/Output from previous day: 10/12 0701 - 10/13 0700 In: -  Out: 1226 [Urine:1160; Drains:66] Intake/Output this shift: No intake/output data recorded.  General appearance: alert, cooperative and no distress Heart: regular rate and rhythm Lungs: fair air exchange throughout, some ronchi- improves with cough Abdomen: nontender Extremities: no edema  Lab Results: Recent Labs    12/07/17 1007  WBC 7.9  HGB 14.0  HCT 46.8  PLT 175   BMET:  Recent Labs    12/07/17 1007 12/08/17 0327  NA 138 139  K 3.7 3.5  CL 104 104  CO2 24 27  GLUCOSE 91 103*  BUN 18 24*  CREATININE 0.89 1.00  CALCIUM 8.8* 8.6*    PT/INR:  Recent Labs    12/07/17 1007  LABPROT 13.6  INR 1.05   ABG    Component Value Date/Time   PHART 7.440 06/25/2015 1309   HCO3 27.9 (H) 06/25/2015 1313   TCO2 33 11/28/2015 1707   O2SAT 65.0 06/25/2015 1313   CBG (last 3)  Recent Labs    12/08/17 0702 12/09/17 0608  GLUCAP 83 90    Meds Scheduled Meds: . atorvastatin  40 mg Oral q1800  . enoxaparin (LOVENOX) injection  40 mg Subcutaneous Q24H  . feeding supplement (PRO-STAT SUGAR FREE 64)  30 mL Oral BID  . multivitamin with minerals  1 tablet Oral Daily  . senna  1 tablet Oral BID  . sodium chloride flush  3 mL Intravenous Q12H   Continuous Infusions: . cefTRIAXone (ROCEPHIN)  IV 1 g (12/08/17 1259)   PRN  Meds:.acetaminophen **OR** acetaminophen, benzonatate, oxyCODONE  Xrays Dg Chest 2 View  Result Date: 12/07/2017 CLINICAL DATA:  Atrial fibrillation.  Shortness of breath. EXAM: CHEST - 2 VIEW COMPARISON:  October 07, 2016 FINDINGS: There is a sizable pneumothorax on the left, without tension component. There are scattered areas of scarring and calcification, likely due to old granulomatous disease. There is no edema or consolidation. Heart size is upper normal with pulmonary vascularity within normal limits. No adenopathy. No bone lesions. IMPRESSION: Sizable pneumothorax on the left without tension component. Evidence of scarring and prior granulomatous disease changes with calcification. No edema or consolidation. Stable cardiac silhouette. Critical Value/emergent results were called by telephone at the time of interpretation on 12/07/2017 at 10:32 am to Dr. Benjiman Core , who verbally acknowledged these results. Electronically Signed   By: Bretta Bang III M.D.   On: 12/07/2017 10:33   Ct Chest Wo Contrast  Result Date: 12/07/2017 CLINICAL DATA:  72 year old male with history of shortness of breath and tachycardia. Known history of pneumothorax. EXAM: CT CHEST WITHOUT CONTRAST TECHNIQUE: Multidetector CT imaging of the chest was performed following the standard protocol without IV contrast. COMPARISON:  Chest CT 06/23/2015. FINDINGS: Cardiovascular: Heart size is normal.  There is no significant pericardial fluid, thickening or pericardial calcification. There is aortic atherosclerosis, as well as atherosclerosis of the great vessels of the mediastinum and the coronary arteries, including calcified atherosclerotic plaque in the left main, left anterior descending, left circumflex and right coronary arteries. Mediastinum/Nodes: No pathologically enlarged mediastinal or hilar lymph nodes. Please note that accurate exclusion of hilar adenopathy is limited on noncontrast CT scans. Esophagus is  unremarkable in appearance. No axillary lymphadenopathy. Lungs/Pleura: Large left-sided pneumothorax which is predominantly subpulmonic. There several pleural adhesions throughout the mid to upper left hemithorax which prevent complete collapse of the left lung. No right pneumothorax. Diffuse bronchial wall thickening with moderate to severe centrilobular and paraseptal emphysema. Patchy multifocal peribronchovascular predominant nodularity most evident throughout the mid to upper lungs, with many of these densely calcified, likely to reflect sequela of old granulomatous disease. Extensive bronchiectasis, focal thickening of the peribronchovascular interstitium and chronic volume loss in the left upper lobe extending to the apex, most compatible with an area of post infectious or inflammatory scarring. No acute consolidative airspace disease. No pleural effusions. Upper Abdomen: 1.2 cm low-attenuation lesion in segment 4A of the liver, incompletely characterized on today's noncontrast CT examination, but statistically likely a cyst. 4.4 cm low-attenuation lesion in the upper pole of the left kidney, incompletely characterized on today's noncontrast CT examination, but statistically likely a cyst. 3 mm nonobstructive calculus in the upper pole collecting system of left kidney. Aortic atherosclerosis. Musculoskeletal: Chronic compression fractures of T8, T12 and L1, most severe at L1 where there is 20% loss of anterior vertebral body height. There are no aggressive appearing lytic or blastic lesions noted in the visualized portions of the skeleton. IMPRESSION: 1. Large left-sided pneumothorax, predominantly subpulmonic in position with extensive pleural adhesions throughout the left hemithorax. The largest volume of left pneumothorax is in the subpulmonic position. 2. Sequela of old granulomatous disease in the lungs and extensive post infectious or inflammatory scarring, as above. 3. Diffuse bronchial wall thickening  with moderate to severe centrilobular and paraseptal emphysema; imaging findings suggestive of underlying COPD. 4. Aortic atherosclerosis, in addition to left main and 3 vessel coronary artery disease. Assessment for potential risk factor modification, dietary therapy or pharmacologic therapy may be warranted, if clinically indicated. 5. 3 mm nonobstructive calculus in the upper pole collecting system of left kidney. 6. Additional incidental findings, as above. Aortic Atherosclerosis (ICD10-I70.0) and Emphysema (ICD10-J43.9). Electronically Signed   By: Trudie Reed M.D.   On: 12/07/2017 19:19   Dg Chest Port 1 View  Result Date: 12/08/2017 CLINICAL DATA:  Pneumothorax.  Status post chest tube placement. EXAM: PORTABLE CHEST 1 VIEW COMPARISON:  CT chest 12/07/2017 FINDINGS: Heart is mildly enlarged. Pigtail catheter has been placed in the left pleural space. The left pneumothorax is markedly reduced, measuring less than 10%. Extensive chronic scarring is present bilaterally, particularly at the lung apices. IMPRESSION: 1. Reduction of left pneumothorax to less than 10% following placement of pigtail catheter. 2. Cardiomegaly without failure. 3. Otherwise stable chronic scarring. Electronically Signed   By: Marin Roberts M.D.   On: 12/08/2017 09:55   Ct Image Guided Fluid Drain By Catheter  Result Date: 12/07/2017 INDICATION: 72 year old with shortness of breath and spontaneous left pneumothorax. Patient needs a left chest tube. EXAM: CT-GUIDED PLACEMENT OF LEFT CHEST TUBE MEDICATIONS: No antibiotics were given for this procedure. ANESTHESIA/SEDATION: Fentanyl 100 mcg IV; Versed 2 mg IV Moderate Sedation Time:  20 minutes The patient was continuously monitored during the procedure by  the interventional radiology nurse under my direct supervision. COMPLICATIONS: None immediate. PROCEDURE: Informed written consent was obtained from the patient after a thorough discussion of the procedural risks,  benefits and alternatives. All questions were addressed. A timeout was performed prior to the initiation of the procedure. Patient was placed supine on the CT scanner. Images through the chest were obtained. Largest pleural air component is at the left lung base. This area was targeted for tube placement. Lateral left lower chest was prepped with chlorhexidine and sterile field was created. Maximal barrier sterile technique was utilized including caps, mask, sterile gowns, sterile gloves, sterile drape, hand hygiene and skin antiseptic. Skin was anesthetized with 1% lidocaine. Yueh catheter was directed into the pleural space in the left lower chest. Air was aspirated. A stiff Amplatz wire was placed. The tract was dilated to accommodate a 14 [place] multipurpose drain. The drain was easily advanced into the pleural space. Catheter was sutured to the skin. Drain was attached to the PleurEvac and wall suction. Follow up CT images were obtained after chest tube placement. FINDINGS: Large complex left pneumothorax. Largest air component was at the left chest base. Catheter was placed at the left lung base. Pneumothorax markedly decreased in size following chest tube placement. IMPRESSION: Successful CT-guided placement of a left chest tube. Electronically Signed   By: Richarda Overlie M.D.   On: 12/07/2017 21:39    Assessment/Plan: S/P left pigtail catheter for pneumothorax 1 no air leak, check CXR this am -poss water seal if lung remains expanded 2 routine pulm toilet, IS/flutter valve 3 medical management per primary    LOS: 2 days    Rowe Clack PA-C 12/09/2017 Pager [phone]  No air leak Chest tube to water seal  I have seen and examined [name] and agree with the above assessment  and plan.  Delight Ovens MD Beeper [phone] Office [phone] 12/09/2017 1:56 PM

## 2017-12-09 NOTE — Progress Notes (Signed)
   Subjective: Patient evaluated this morning.  States he is having difficulty swallowing food. He states that he is able to swallow liquids and his pills. He states he feels anxious but cannot further elaborate. Denies shortness of breath or difficulty breathing  Objective:  Vital signs in last 24 hours: Vitals:   12/08/17 1518 12/08/17 1953 12/09/17 0410 12/09/17 0444  BP:  (!) 138/117 139/87   Pulse:  75 72   Resp:  (!) 21 17   Temp:  97.8 F (36.6 C) 97.6 F (36.4 C) 97.7 F (36.5 C)  TempSrc:  Oral Oral Oral  SpO2:  93% 92%   Weight:    44.5 kg  Height: 6' (1.829 m)      Physical Exam  Constitutional:  Thin appearing  HENT:  Mouth/Throat: Oropharynx is clear and moist. No oropharyngeal exudate.  Cardiovascular: Normal rate, regular rhythm and normal heart sounds. Exam reveals no gallop and no friction rub.  No murmur heard. Pulmonary/Chest: Effort normal and breath sounds normal. No respiratory distress. He has no wheezes.  Catheter in place on left side  Musculoskeletal: He exhibits no edema.  Right BKA  Skin: Skin is warm and dry.     Assessment/Plan:  Active Problems:   Hepatitis C antibody positive in blood   PVD (peripheral vascular disease) (HCC)   Pneumothorax   Nonischemic cardiomyopathy (HCC)   Chronic atrial fibrillation   S/P unilateral BKA (below knee amputation), right (HCC)  Recurrent Spontaneous secondary left pneumothorax  Patient had repeat chest x-ray today showing stability in improvement in left apical pneumothorax.  Left sided chest catheter in place. Oxygenating well on room air.  CVTS managing catheter and possibly water seal today. -  CVTS following  Urinary tract infection Urine culture growing Klebsiella oxytoca.  Will switch to oral medication tomorrow, likely Keflex -IV ceftriaxone -UA follow-up in 4-6 weeks after discharge   Paroxysmal A. Fib Rates controlled not on medication. He declines anticoagulation.  Will continue to  follow and restart beta blocker if needed. -Monitor  Globus sensation Patient states that this morning he had difficulty swallowing his food. He states that he is able to drink liquids and swallow his pills without difficulty. He reports some anxiety. Will try Ativan and if this doesn't help with symptoms may need an esophagram vs mod barium swallow. -Ativan 0.5mg  once -Speech eval for esophagram vs mod barium swallow if symptoms do not improve  Untreated hep C Plan for outpatient treatment at the [facility] History of tuberculosis status post treatment Peripheral vascular disease status post right BKA  Dispo: Anticipated discharge pending clinical improvement and discontinuation of chest tube.   Geralyn Corwin [place], DO 12/09/2017, 12:01 PM Pager: [phone]

## 2017-12-09 NOTE — Progress Notes (Signed)
RT Note: Patient was instructed on proper flutter valve use and technique. He demonstrated the technique well with no complications. He is able to use the flutter valve independently presently. Rt will continue to monitor and assist as needed.

## 2017-12-10 ENCOUNTER — Inpatient Hospital Stay (HOSPITAL_COMMUNITY): Payer: Medicare Other

## 2017-12-10 DIAGNOSIS — R1310 Dysphagia, unspecified: Secondary | ICD-10-CM

## 2017-12-10 LAB — GLUCOSE, CAPILLARY: Glucose-Capillary: 100 mg/dL — ABNORMAL HIGH (ref 70–99)

## 2017-12-10 MED ORDER — CEPHALEXIN 500 MG PO CAPS
500.0000 mg | ORAL_CAPSULE | Freq: Two times a day (BID) | ORAL | Status: DC
  Administered 2017-12-10 – 2017-12-12 (×5): 500 mg via ORAL
  Filled 2017-12-10 (×5): qty 1

## 2017-12-10 MED ORDER — ORAL CARE MOUTH RINSE
15.0000 mL | Freq: Two times a day (BID) | OROMUCOSAL | Status: DC
  Administered 2017-12-10 – 2017-12-11 (×3): 15 mL via OROMUCOSAL

## 2017-12-10 NOTE — Progress Notes (Addendum)
      [address]       [name] [address]             [phone]         Subjective: He is doing okay this morning. Coughing quite a bit.   Objective: Vital signs in last 24 hours: Temp:  [97.7 F (36.5 C)-98.4 F (36.9 C)] 97.8 F (36.6 C) (10/14 0447) Pulse Rate:  [53-75] 72 (10/14 0447) Resp:  [16-20] 16 (10/14 0447) BP: (108-126)/(69-86) 126/79 (10/14 0447) SpO2:  [92 %-94 %] 94 % (10/14 0447) Weight:  [43.1 kg] 43.1 kg (10/14 0447)     Intake/Output from previous day: 10/13 0701 - 10/14 0700 In: 253 [P.O.:240; I.V.:3] Out: 690 [Urine:620; Drains:70] Intake/Output this shift: No intake/output data recorded.  General appearance: alert, cooperative and no distress Heart: NSR with frequent PVCs Lungs: clear to auscultation bilaterally and diminished in lower lobes Abdomen: soft, non-tender; bowel sounds normal; no masses,  no organomegaly Extremities: extremities normal, atraumatic, no cyanosis or edema Wound: good chest tube placement  Lab Results: Recent Labs    12/07/17 1007  WBC 7.9  HGB 14.0  HCT 46.8  PLT 175   BMET:  Recent Labs    12/07/17 1007 12/08/17 0327  NA 138 139  K 3.7 3.5  CL 104 104  CO2 24 27  GLUCOSE 91 103*  BUN 18 24*  CREATININE 0.89 1.00  CALCIUM 8.8* 8.6*    PT/INR:  Recent Labs    12/07/17 1007  LABPROT 13.6  INR 1.05   ABG    Component Value Date/Time   PHART 7.440 06/25/2015 1309   HCO3 27.9 (H) 06/25/2015 1313   TCO2 33 11/28/2015 1707   O2SAT 65.0 06/25/2015 1313   CBG (last 3)  Recent Labs    12/08/17 0702 12/09/17 0608 12/10/17 0605  GLUCAP 83 90 100*    Assessment/Plan: S/P chest tube placement on 10/11 for pneumothorax  1. CXR shows no pneumothorax. The chest tube is on water seal without an airleak. 59ml of drainage since placement 2. CV- NSR with frequent PVCs. BP has been well controlled 3. Renal-creatinine 1.00 4. Continue to work on pain control with oral  oxycodone 5. Hyperlipidemia-continue statin therapy  Plan: chest tube removal likely tomorrow. No air leak and CXR is stable today on exam. Follow-up with the [place].      LOS: 3 days    Sharlene Dory 12/10/2017   No air leak I have seen and examined [name] and agree with the above assessment  and plan.  Delight Ovens MD Beeper [phone] Office [phone] 12/10/2017 6:37 PM

## 2017-12-10 NOTE — Progress Notes (Signed)
Medicine attending: I examined this patient today and I concur with the evaluation and management plan as recorded by resident physician Dr Lerry Liner. Tolerating the left chest tube well. Oxygenating well.  Stable heart rate with atrial fibrillation. Portable chest x-ray which I personally reviewed shows reexpansion of the left lung. Per cardiovascular surgery, chest tube will likely be removed tomorrow.  Chest tube will likely be removed tomorrow. We will evaluate patient's complaints of dysphagia.  Previously noted to have a "globus sensation".  Current symptoms may be chronic but given his lifestyle, he is at risk for esophageal cancer so we will exclude this possibility while he is hospitalized.

## 2017-12-10 NOTE — Evaluation (Signed)
Clinical/Bedside Swallow Evaluation Patient Details  Name: ERINN MOONEN MRN: [id number] Date of Birth: 1945-12-31  Today's Date: 12/10/2017 Time: SLP Start Time (ACUTE ONLY): 1140 SLP Stop Time (ACUTE ONLY): 1210 SLP Time Calculation (min) (ACUTE ONLY): 30 min  Past Medical History:  Past Medical History:  Diagnosis Date  . Anxiety   . Arthritis    "hands" (11/29/2015)  . Chronic systolic CHF (congestive heart failure) (HCC)    a. 05/2015 Echo: EF 15-20%, diff HK.  Marland Kitchen Cirrhosis of liver (HCC)   . COPD (chronic obstructive pulmonary disease) (HCC)   . GERD (gastroesophageal reflux disease)   . Headache    "weekly, if that" (11/29/2015)  . 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 Hepatitis C   . High cholesterol   . History of blood transfusion 2000  . Hypertension   . Kidney stones    "alot"  . Marijuana abuse   . NICM (nonischemic cardiomyopathy) (HCC)    a. 05/2015 Echo: EF 15-20%, diff HK, mod MR, sev dil LA, mild TR, PASP ;  b. 05/2015 R/LHC: LM nl, LAD 30d, LCX nl, RCA 40ost, 36m, EF 20%.  . Non-obstructive CAD (coronary artery disease)    a. 05/2015 R/LHC: LM nl, LAD 30d, LCX nl, RCA 40ost, 28m, EF 20%.  . Paroxysmal atrial fibrillation (HCC)    a. 05/2015 - converted on amio. Also placed on Eliquis (CHA2DS2VASc = 3), but never took b/c he couldn't afford and didn't f/u @ [place].  Marland Kitchen Pneumonia 05/2015  . Pneumothorax 12/07/2017  . 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  . Tobacco abuse   . Tuberculosis 2007   Past Surgical History:  Past Surgical History:  Procedure Laterality Date  . ABOVE KNEE LEG AMPUTATION Right 2000  . CARDIAC CATHETERIZATION N/A 06/25/2015   Procedure: Right/Left Heart Cath and Coronary Angiography;  Surgeon: Dolores Patty, MD;  Location: [facility] INVASIVE CV LAB;  Service: Cardiovascular;  Laterality: N/A;  . CATARACT EXTRACTION W/ INTRAOCULAR LENS  IMPLANT, BILATERAL Bilateral ~ 2003  . EXTRACORPOREAL  SHOCK WAVE LITHOTRIPSY     HPI:  72 year old male admitted 12/07/17 with large left pneumothorax. PMH: COPD, GERD, hepatic encephalopathy, HepC, HTN, CAD, PVD, cirrhosis.   Assessment / Plan / Recommendation Clinical Impression  Pt presents with adequate oral motor strength and function. He is missing most of his teeth. Pt was observed with thin liquids and puree consistencies. No overt s/s aspiration observed on any consistency, however, pt exhibits frequent belching, and reports globus sensation. Pt reports not taking PPI, and has not had esophageal work up in the past.   Recommend completion of a regular barium swallow to evaluate esophageal motility and to determine appropriate course of action. SLP will follow up for education after completion of barium swallow.   SLP Visit Diagnosis: Dysphagia, unspecified (R13.10)    Aspiration Risk  Mild aspiration risk    Diet Recommendation Regular;Thin liquid   Liquid Administration via: Straw;Cup Medication Administration: Whole meds with liquid Supervision: Patient able to self feed Compensations: Minimize environmental distractions;Slow rate;Small sips/bites(reflux precautions) Postural Changes: Seated upright at 90 degrees;Remain upright for at least 30 minutes after po intake    Other  Recommendations Oral Care Recommendations: Oral care BID   Follow up Recommendations None      Frequency and Duration min 1 x/week  1 week       Prognosis Prognosis for Safe Diet Advancement: Good  Swallow Study   General Date of Onset: 12/07/17 HPI: 72 year old male admitted 12/07/17 with large left pneumothorax. PMH: COPD, GERD, hepatic encephalopathy, HepC, HTN, CAD, PVD, cirrhosis. Type of Study: Bedside Swallow Evaluation Previous Swallow Assessment: none Diet Prior to this Study: Regular;Thin liquids Temperature Spikes Noted: No Respiratory Status: Nasal cannula History of Recent Intubation: No Behavior/Cognition:  Alert;Cooperative;Pleasant mood Oral Cavity Assessment: Dry Oral Care Completed by SLP: No Oral Cavity - Dentition: Missing dentition Vision: Functional for self-feeding Self-Feeding Abilities: Able to feed self Patient Positioning: Upright in bed Baseline Vocal Quality: Normal Volitional Cough: Strong Volitional Swallow: Able to elicit    Oral/Motor/Sensory Function Overall Oral Motor/Sensory Function: Within functional limits   Ice Chips Ice chips: Not tested   Thin Liquid Thin Liquid: Within functional limits Presentation: Cup;Straw    Nectar Thick Nectar Thick Liquid: Not tested   Honey Thick Honey Thick Liquid: Not tested   Puree Puree: Within functional limits Presentation: Spoon;Self Fed   Solid    Armella Stogner B. Murvin [facility], CCC-SLP Speech Language Pathologist [phone] Solid: Within functional limits Presentation: Self Fed      Leigh Aurora 12/10/2017,12:32 PM

## 2017-12-10 NOTE — Progress Notes (Signed)
   Subjective: [name] reported feeling okay this morning. He said he felt his breathing has improved. Denied any trouble urinating.   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  Left sided chest catheter in place. Oxygenating well on room air. CVTS managing catheter, chest tube removal likely tomorrow. -  CVTS following   Urinary tract infection Urine culture growing Klebsiella oxytoca.  He received IV ceftriaxone x3. Will switch to keflex today -keflex 500 mg bid x2 days  -UA follow-up in 4-6 weeks after discharge   Paroxysmal A. Fib Rates continue to be controlled not on medication. He declines anticoagulation.  Will continue to follow and restart beta blocker if needed. -Monitor  Globus sensation Patient is still complaining of difficulty swallowing, day 2. He said it is not related to solids or liquids. His anxiety was better today. Tried Ativan yesterday, helped with anxiety some. Will continue with speech eval today, for esophagram vs mod barium swallow. - f/u speech eval   Untreated hep C Plan for outpatient treatment at the [facility] History of  tuberculosis status post treatment Peripheral vascular disease status post right BKA  Dispo: Anticipated discharge pending clinical improvement and discontinuation of chest tube.  Jaci Standard, DO 12/10/2017, 6:41 AM Pager: [phone]

## 2017-12-11 ENCOUNTER — Inpatient Hospital Stay (HOSPITAL_COMMUNITY): Payer: Medicare Other

## 2017-12-11 DIAGNOSIS — J9383 Other pneumothorax: Principal | ICD-10-CM

## 2017-12-11 DIAGNOSIS — B182 Chronic viral hepatitis C: Secondary | ICD-10-CM

## 2017-12-11 DIAGNOSIS — K7460 Unspecified cirrhosis of liver: Secondary | ICD-10-CM

## 2017-12-11 DIAGNOSIS — B961 Klebsiella pneumoniae [K. pneumoniae] as the cause of diseases classified elsewhere: Secondary | ICD-10-CM

## 2017-12-11 DIAGNOSIS — K222 Esophageal obstruction: Secondary | ICD-10-CM

## 2017-12-11 LAB — GLUCOSE, CAPILLARY: GLUCOSE-CAPILLARY: 120 mg/dL — AB (ref 70–99)

## 2017-12-11 MED ORDER — SODIUM CHLORIDE 0.9 % IV SOLN: INTRAVENOUS | Status: DC

## 2017-12-11 MED ORDER — POLYETHYLENE GLYCOL 3350 17 G PO PACK
17.0000 g | PACK | Freq: Every day | ORAL | Status: DC
  Administered 2017-12-11 – 2017-12-13 (×2): 17 g via ORAL
  Filled 2017-12-11 (×4): qty 1

## 2017-12-11 MED ORDER — PANTOPRAZOLE SODIUM 40 MG PO TBEC
40.0000 mg | DELAYED_RELEASE_TABLET | Freq: Every day | ORAL | Status: DC
  Administered 2017-12-12: 40 mg via ORAL
  Filled 2017-12-11: qty 1

## 2017-12-11 NOTE — Consult Note (Addendum)
[facility] Consult: 11:48 AM 12/11/2017  LOS: 4 days    Referring Provider:  Dr. Cyndie Chime and Dr Karilyn Cota (resident)  Primary Care Physician:  [facility] more recently the [place] in [place]. Primary Gastroenterologist: [place] [place]   Reason for Consultation: Dysphagia.   HPI: [name] is a 72 y.o. male.  *PMH emphysema.  History of tuberculosis.  PVD.  Status post right BKA 2000.  PAF, not on anticoagulation due to patient opposition, inability to afford Eliquis, and not following up with physicians to obtain prescriptions.  Systolic CHF.  Ejection fraction 30 -35% 09/2016.  Nephrolithiasis.  Thrombocytopenia 09/2016 Cirrhosis of the liver.  Hepatitis C, untreated. Previous colonoscopy, possibly about 5 years ago though patient cannot recall exactly when.  He was told he was "clean as a whistle". He may have had upper endoscopy versus a bronchoscopy but has no recall of why they did it or what they found.  . Admitted 4 days ago with spontaneous pneumothorax.  Chest tube placed. Complained to staff of solid food dysphagia.  How long problem but he may be for a few weeks not several months.  When he swallows meat or other solid food get stuck in the region corresponding with base of his neck.  Has not regurgitated.  Tolerates liquids just fine.  Endorses weight loss but is unclear on how much.  Has always been thin, weight 120's # when he entered the [employer] in the 1960s. He was told he had hepatitis C in the 1990s, is never been treated.  [facility].  Esophagram/barium swallow 10/15: Small HH.  Smooth focal narrowing at distal esophagus.  13 mm tablet stuck at that region and reproduces the patient's symptoms.  Previously lived in  [place] and got his care at the [place] there, he moved to live with family in [place].    In the past he consumed alcohol heavily, he often consumed 1/5 of liquor, mostly rum, daily.  Has not had any alcohol for ~ 1 year.  Heroin in the 60s, snorted cocaine.   Tattoos were placed in the [place] in the 1960s  Past Medical History:  Diagnosis Date  . Anxiety   . Arthritis    "hands" (11/29/2015)  . Chronic systolic CHF (congestive heart failure) (HCC)    a. 05/2015 Echo: EF 15-20%, diff HK.  Marland Kitchen Cirrhosis of liver (HCC)   . COPD (chronic obstructive pulmonary disease) (HCC)   . GERD (gastroesophageal reflux disease)   . Headache    "weekly, if that" (11/29/2015)  . 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 Hepatitis C   . High cholesterol   . History of blood transfusion 2000  . Hypertension   . Kidney stones    "alot"  . Marijuana abuse   . NICM (nonischemic cardiomyopathy) (HCC)    a. 05/2015 Echo: EF 15-20%, diff HK, mod MR, sev dil LA, mild TR, PASP ;  b. 05/2015 R/LHC: LM nl, LAD 30d, LCX nl, RCA 40ost, 23m, EF 20%.  Marland Kitchen  Non-obstructive CAD (coronary artery disease)    a. 05/2015 R/LHC: LM nl, LAD 30d, LCX nl, RCA 40ost, 91m, EF 20%.  . Paroxysmal atrial fibrillation (HCC)    a. 05/2015 - converted on amio. Also placed on Eliquis (CHA2DS2VASc = 3), but never took b/c he couldn't afford and didn't f/u @ [place].  Marland Kitchen Pneumonia 05/2015  . Pneumothorax 12/07/2017  . 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  . Tobacco abuse   . Tuberculosis 2007    Past Surgical History:  Procedure Laterality Date  . ABOVE KNEE LEG AMPUTATION Right 2000  . CARDIAC CATHETERIZATION N/A 06/25/2015   Procedure: Right/Left Heart Cath and Coronary Angiography;  Surgeon: Dolores Patty, MD;  Location: [facility] INVASIVE CV LAB;  Service: Cardiovascular;  Laterality: N/A;  . CATARACT EXTRACTION W/ INTRAOCULAR LENS  IMPLANT, BILATERAL Bilateral ~ 2003  . EXTRACORPOREAL SHOCK WAVE  LITHOTRIPSY      Prior to Admission medications   Medication Sig Start Date End Date Taking? Authorizing Provider  apixaban (ELIQUIS) 5 MG TABS tablet Take 1 tablet (5 mg total) by mouth 2 (two) times daily. Patient not taking: Reported on 12/07/2017 10/09/16   Auburn Bilberry, MD  atorvastatin (LIPITOR) 40 MG tablet Take 1 tablet (40 mg total) by mouth daily at 6 PM. Patient not taking: Reported on 12/07/2017 10/09/16   Auburn Bilberry, MD  digoxin 62.5 MCG TABS Take 0.0625 mg by mouth daily. Patient not taking: Reported on 12/07/2017 04/07/17   Altamese Dilling, MD  finasteride (PROSCAR) 5 MG tablet Take 1 tablet (5 mg total) by mouth daily. Patient not taking: Reported on 12/07/2017 04/07/17   Altamese Dilling, MD  Ipratropium-Albuterol (COMBIVENT RESPIMAT) 20-100 MCG/ACT AERS respimat Inhale 1 puff into the lungs every 6 (six) hours. Patient not taking: Reported on 12/07/2017 10/09/16   Auburn Bilberry, MD  midodrine (PROAMATINE) 5 MG tablet Take 1 tablet (5 mg total) by mouth 2 (two) times daily at 10 AM and 5 PM. Patient not taking: Reported on 12/07/2017 10/09/16   Auburn Bilberry, MD  mometasone-formoterol [facility]) 200-5 MCG/ACT AERO Inhale 2 puffs into the lungs 2 (two) times daily. Patient not taking: Reported on 04/03/2017 10/09/16   Auburn Bilberry, MD  Multiple Vitamin (MULTIVITAMIN WITH MINERALS) TABS tablet Take 1 tablet by mouth daily. Patient not taking: Reported on 12/07/2017 04/07/17   Altamese Dilling, MD  vitamin C (VITAMIN C) 250 MG tablet Take 1 tablet (250 mg total) by mouth 2 (two) times daily. Patient not taking: Reported on 12/07/2017 04/06/17   Altamese Dilling, MD    Scheduled Meds: . atorvastatin  40 mg Oral q1800  . cephALEXin  500 mg Oral Q12H  . enoxaparin (LOVENOX) injection  40 mg Subcutaneous Q24H  . feeding supplement (PRO-STAT SUGAR FREE 64)  30 mL Oral BID  . mouth rinse  15 mL Mouth Rinse BID  . multivitamin with minerals  1 tablet Oral  Daily  . senna  1 tablet Oral BID  . sodium chloride flush  3 mL Intravenous Q12H   Infusions:  PRN Meds: acetaminophen **OR** acetaminophen, benzonatate, oxyCODONE   Allergies as of 12/07/2017  . (No Known Allergies)    Family History  Problem Relation Age of Onset  . Dementia Mother     Social History   Socioeconomic History  . Marital status: Divorced    Spouse name: Not on file  . Number of children: Not on file  . Years of education:  Not on file  . Highest education level: Not on file  Occupational History  . Not on file  Social Needs  . Financial resource strain: Not on file  . Food insecurity:    Worry: Not on file    Inability: Not on file  . Transportation needs:    Medical: Not on file    Non-medical: Not on file  Tobacco Use  . Smoking status: Current Some Day Smoker    Years: 50.00    Types: Cigarettes  . Smokeless tobacco: Never Used  . Tobacco comment: used to smoke 2ppd but for past 10 yrs has been smoking 1 or less/day - usually just a few puffs off of someone else's cigarette  Substance and Sexual Activity  . Alcohol use: No    Comment: used to drink heavily - quit in 2000  . Drug use: Yes    Types: Marijuana    Comment: occasional marijuana cigarette  . Sexual activity: Not on file  Lifestyle  . Physical activity:    Days per week: Not on file    Minutes per session: Not on file  . Stress: Not on file  Relationships  . Social connections:    Talks on phone: Not on file    Gets together: Not on file    Attends religious service: Not on file    Active member of club or organization: Not on file    Attends meetings of clubs or organizations: Not on file    Relationship status: Not on file  . Intimate partner violence:    Fear of current or ex partner: Not on file    Emotionally abused: Not on file    Physically abused: Not on file    Forced sexual activity: Not on file  Other Topics Concern  . Not on file  Social History Narrative     Lives in [place].  Does not routinely exercise.  Has not f/u with medical providers @ [place].  Does not take previously Rx medications.    REVIEW OF SYSTEMS: Constitutional: Some weakness.  No falls. ENT:  No nose bleeds Pulm: Short of breath, cough. CV:  No palpitations, no LE edema.  Chest pain. GU:  No hematuria, no frequency GI:  Per HPI.   Heme: Bleeds and bruises easily.  Lots of purpura on his arms. Transfusions: No previous blood transfusions. Neuro:  No headaches, no peripheral tingling or numbness Derm:  No itching, no rash or sores.  Endocrine:  No sweats or chills.  No polyuria or dysuria Immunization:  Not  Queried.. Pneumovax in 2017 Travel:  None beyond local counties in last few months.    PHYSICAL EXAM: Vital signs in last 24 hours: Vitals:   12/11/17 0349 12/11/17 0734  BP: 137/87 (!) 141/90  Pulse: (!) 45 68  Resp: 15 15  Temp: 98.2 F (36.8 C) 98.4 F (36.9 C)  SpO2: 100% 97%   Wt Readings from Last 3 Encounters:  12/11/17 44.9 kg  04/03/17 44.7 kg  10/09/16 44.5 kg    General: Thin, cachectic, alert, pleasant, chronically ill, WM Head: Muscle wasting.  Prominent veins.   Eyes: No scleral icterus.  No conjunctival pallor.  EOMI. Ears: Hard of hearing Nose: No congestion or discharge.  Small lesion that looks like it could be a basal cell on the folds of the right nares. Mouth: Moist, clear, pink oral mucosa.  3 or 4 teeth remain all of which are incisors, no molars.  Tongue midline. Neck:  No JVD, no masses, no thyromegaly. Lungs: Diminished breath sounds.  Chest tube on the left Heart: RRR.  No MRG.  S1, S2 present. Abdomen: Scaphoid soft.  Not tender or distended.  No HSM, masses, bruits, hernias.  Bowel sounds active..   Rectal: Deferred Musc/Skeltl: Arthritic deformities in the hands/fingers, knees.  Kyphosis.  Overall osteoporotic appearance Extremities: No CCE.  Right AKA. Neurologic: Alert.  Oriented x3.  Word finding problems.  Poor recall  of event timing.  Moves all 4 limbs.  No asterixis or obvious deficits/weakness. Skin:  ?  Basal cell on right side of her nose.  Purpura on the arms. Tattoos: Aged and somewhat crude looking tattoos on the arms. Nodes: No cervical adenopathy. Psych: Cooperative, pleasant.  Intake/Output from previous day: 10/14 0701 - 10/15 0700 In: 604 [P.O.:600] Out: 1702 [Urine:1690; Drains:12] Intake/Output this shift: Total I/O In: 240 [P.O.:240] Out: 120 [Urine:120]  LAB RESULTS: No results for input(s): WBC, HGB, HCT, PLT in the last 72 hours. BMET Lab Results  Component Value Date   NA 139 12/08/2017   NA 138 12/07/2017   NA 134 (L) 04/03/2017   K 3.5 12/08/2017   K 3.7 12/07/2017   K 4.0 04/03/2017   CL 104 12/08/2017   CL 104 12/07/2017   CL 97 (L) 04/03/2017   CO2 27 12/08/2017   CO2 24 12/07/2017   CO2 31 04/03/2017   GLUCOSE 103 (H) 12/08/2017   GLUCOSE 91 12/07/2017   GLUCOSE 98 04/03/2017   BUN 24 (H) 12/08/2017   BUN 18 12/07/2017   BUN 17 04/03/2017   CREATININE 1.00 12/08/2017   CREATININE 0.89 12/07/2017   CREATININE 0.87 04/03/2017   CALCIUM 8.6 (L) 12/08/2017   CALCIUM 8.8 (L) 12/07/2017   CALCIUM 8.0 (L) 04/03/2017   LFT No results for input(s): PROT, ALBUMIN, AST, ALT, ALKPHOS, BILITOT, BILIDIR, IBILI in the last 72 hours. PT/INR Lab Results  Component Value Date   INR 1.05 12/07/2017   INR 1.04 11/28/2015   INR 1.17 06/25/2015    Drugs of Abuse     Component Value Date/Time   LABOPIA NONE DETECTED 12/07/2017 1118   COCAINSCRNUR NONE DETECTED 12/07/2017 1118   LABBENZ NONE DETECTED 12/07/2017 1118   AMPHETMU NONE DETECTED 12/07/2017 1118   THCU POSITIVE (A) 12/07/2017 1118   LABBARB NONE DETECTED 12/07/2017 1118     RADIOLOGY STUDIES: Dg Chest 1 View  Result Date: 12/11/2017 CLINICAL DATA:  Cough, chest tube treatment for pneumothorax. History of hepatitis-C, recurrent pneumothoraces, chronic atrial fibrillation, history of tuberculosis.  EXAM: CHEST  1 VIEW COMPARISON:  PA and lateral chest x-ray of December 10, 2017 FINDINGS: The lungs are hyperinflated. No definite residual pneumothorax is observed. The pigtail pleural drainage catheter on the left is in stable position at the lung base medially. There are pleuroparenchymal calcifications bilaterally in the mid and upper lobes which are stable. The heart and pulmonary vascularity are normal. There is calcification in the wall of the thoracic aorta. IMPRESSION: No residual pneumothorax is observed. The small caliber chest tube is in stable position on the left. 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 Underlying COPD. Thoracic aortic atherosclerosis. Electronically Signed   By: David  [place] M.D.   On: 12/11/2017 08:51   Dg Esophagus  Result Date: 12/11/2017 CLINICAL DATA:  Difficulty swallowing solids EXAM: ESOPHOGRAM/BARIUM SWALLOW TECHNIQUE: Single contrast examination was performed using  thin barium. FLUOROSCOPY TIME:  Fluoroscopy Time:  2 minutes Radiation Exposure Index (if provided by the fluoroscopic  device): Number of Acquired Spot Images: 0 COMPARISON:  None. FINDINGS: Fluoroscopic evaluation of swallowing demonstrates disruption of primary esophageal peristaltic waves. Small hiatal hernia with mild focal narrowing in the distal esophagus just above the hernia. The 13 mm barium tablet sticks in this area and reproduces the patient's symptoms. IMPRESSION: Small hiatal hernia. Smooth focal narrowing in the distal esophagus which the 13 mm barium tablet sticks at and reproduces the patient's symptoms. Electronically Signed   By: Charlett Nose M.D.   On: 12/11/2017 08:32   Dg Chest Port 1 View  Result Date: 12/10/2017 CLINICAL DATA:  Left apical and right mid lung calcifications are unchanged EXAM: PORTABLE CHEST 1 VIEW COMPARISON:  12/09/2017; 12/08/2017; 12/07/2017; CT-guided left-sided chest tube placement-12/07/2017 FINDINGS: Grossly unchanged cardiac  silhouette and mediastinal contours with atherosclerotic plaque within the thoracic aorta. Stable position of support apparatus. No definite pneumothorax. The lungs remain hyperexpanded. Right mid and left apical calcifications/granuloma are unchanged. No new focal airspace opacities. No pleural effusion. No evidence of edema. No acute osseus abnormalities. IMPRESSION: 1. Stable position of support apparatus.  No definite pneumothorax. 2. Similar findings of lung hyperexpansion and sequela of prior granulomatous infection. Electronically Signed   By: Simonne Come M.D.   On: 12/10/2017 07:44     IMPRESSION:   *    Solid food dysphagia.  Focal narrowing of barium tablet esophagram.  *    Spontaneous pneumothorax in patient with emphysema.  S/P chest tube, plans are to remove this tomorrow 10/16.  PTX resolved by CXRs yesterday and today.    *    Cirrhosis of the liver.  Untreated hepatitis C.  Minor elevation of AST, otherwise normal LFTs.  Interestingly despite his cachectic appearance, weight loss his albumin is as of 10/11 PT/INR, platelets normal. Child's score ~ 5-6: class A.    *   Weight loss, cachexia, BMI 13.  Interesting that albumin is normal.     PLAN:     *    EGD with esophageal dilatation tomorrow.  Patient understands risks, benefits and would like to proceed in hopes that swallowing will improve.   Jennye Moccasin  12/11/2017, 11:48 AM Phone [phone]

## 2017-12-11 NOTE — Progress Notes (Signed)
   Subjective: [name] reported that he is still having difficulty swallowing today. He said liquids don't bother him but solid foods are hard to swallow. Denied any trouble breathing.   Objective:  Vital signs in last 24 hours: Vitals:   12/10/17 1634 12/10/17 2000 12/11/17 0349 12/11/17 0518  BP: (!) 141/90 140/80 137/87   Pulse: 74 65 (!) 45   Resp: 17 16 15    Temp: 98.2 F (36.8 C) 97.8 F (36.6 C) 98.2 F (36.8 C)   TempSrc: Oral Oral Oral   SpO2: 100% 97% 100%   Weight:   45.8 kg 44.9 kg  Height:       General- seen sitting up in bed, NAD Heart- RRR, no murmrus Lungs- lung sounds have improved, mild diffuse wheezing  Assessment/Plan:  Active Problems:   Hepatitis C antibody positive in blood   PVD (peripheral vascular disease) (HCC)   Recurrent spontaneous pneumothorax   Nonischemic cardiomyopathy (HCC)   Chronic atrial fibrillation   S/P unilateral BKA (below knee amputation), right (HCC)   Dysphagia  Recurrent Spontaneous secondary left pneumothorax Patient had repeat chest x-ray yesterday showing no pneumothorax. Left sided chest catheter in place. Oxygenating well on room air. CVTS managing catheter, chest tube removal possibly today.  - CVTS following   Urinary tract infection Urine culturegrowing Klebsiella oxytoca.He received IV ceftriaxone x3. Continue keflex -keflex 500 mg bid x2 days  -UA follow-up in 4-6 weeks after discharge   Paroxysmal A. Fib Rates continue to be controlled not on medication. He declines anticoagulation. Will continue to follow and restart beta blocker if needed. -Monitor  Globus sensation Patient is still complaining of difficulty swallowing, day 3. He said it is more difficult to swallow solids. Esophagram completed today showed small hiatal hernia with smooth focal narrowing in the distal esophagus, which the 13 mm barium tablet sticks and reproduces the symptoms  - f/u with speech recommendations  Untreated hep  C Plan [facility] outpatient [facility] History of tuberculosis status post treatment Peripheral vascular disease status post right BKA  Dispo: Anticipated discharge in approximately 1-2 day(s).   [name], James N, DO 12/11/2017, 6:50 AM Pager: [phone]

## 2017-12-11 NOTE — Progress Notes (Signed)
  Speech Language Pathology Treatment: Dysphagia  Patient Details Name: [name] MRN: [id number] DOB: 16-Jun-1945 Today's Date: 12/11/2017 Time: 0920-0930 SLP Time Calculation (min) (ACUTE ONLY): 10 min  Assessment / Plan / Recommendation Clinical Impression  Pt demonstrates ongoing sensation of globus, result of esophagram shows small hiatal hernia with smooth focal narrowing in the distal esophagus, which the 13 mm barium tablet sticks and reproduces the symptoms. Reviewed this finding with the pt with limited pt verbalization of understanding despite basic explanation. Also reviewed esophageal precautions and suggestion to crush meds and soften foods. Pt quite lethargic, again does not verbalize understanding. Discussed with RN and MD. GI f/u may be warranted.   HPI HPI: 72 year old male admitted 12/07/17 with large left pneumothorax. PMH: COPD, GERD, hepatic encephalopathy, HepC, HTN, CAD, PVD, cirrhosis.      SLP Plan  Continue with current plan of care       Recommendations  Diet recommendations: Dysphagia 2 (fine chop);Thin liquid Liquids provided via: Cup;Straw Medication Administration: Crushed with puree Supervision: Patient able to self feed Compensations: Minimize environmental distractions;Slow rate;Small sips/bites;Follow solids with liquid;Multiple dry swallows after each bite/sip Postural Changes and/or Swallow Maneuvers: Seated upright 90 degrees                Plan: Continue with current plan of care       GO               James Ditty, MA CCC-SLP  Acute Rehabilitation Services Pager [phone] Office [phone]  [name] 12/11/2017, 10:56 AM

## 2017-12-11 NOTE — Progress Notes (Signed)
Medicine attending: I examined this patient today and I concur with the evaluation and management plan as recorded by resident physician Dr Lerry Liner. Clinically stable.  Good air movement over both lungs.  Anticipate left chest tube will be removed today.  We will observe overnight, repeat chest x-ray in a.m., and if otherwise stable discharge tomorrow.  Barium swallow today to evaluate his symptoms of dysphagia shows a smooth, benign appearing, distal esophageal stricture.  He might benefit from a dilation procedure.  We will ask GI to consult. Ongoing treatment for a Klebsiella urinary tract infection.

## 2017-12-11 NOTE — Progress Notes (Signed)
Patient education completed for removal of chest tube, patient verbalized understanding, chest tube removed as ordered , procedure well tolerated,  will monitor.

## 2017-12-11 NOTE — Progress Notes (Addendum)
      [address]       [name] [address]             [phone]         Subjective: Feels okay. Does not like the food here. Asking for chili.   Objective: Vital signs in last 24 hours: Temp:  [97.6 F (36.4 C)-98.4 F (36.9 C)] 98.4 F (36.9 C) (10/15 0734) Pulse Rate:  [45-74] 68 (10/15 0734) Cardiac Rhythm: Normal sinus rhythm (10/14 2041) Resp:  [15-19] 15 (10/15 0734) BP: (133-150)/(77-90) 141/90 (10/15 0734) SpO2:  [97 %-100 %] 97 % (10/15 0734) Weight:  [44.9 kg-45.8 kg] 44.9 kg (10/15 0518)     Intake/Output from previous day: 10/14 0701 - 10/15 0700 In: 604 [P.O.:600] Out: 1702 [Urine:1690; Drains:12] Intake/Output this shift: No intake/output data recorded.  General appearance: alert, cooperative and no distress Heart: regular rate and rhythm, S1, S2 normal, no murmur, click, rub or gallop Lungs: clear to auscultation bilaterally Abdomen: soft, non-tender; bowel sounds normal; no masses,  no organomegaly Extremities: right BKA, no edema Wound: clean and dry  Lab Results: No results for input(s): WBC, HGB, HCT, PLT in the last 72 hours. BMET: No results for input(s): NA, K, CL, CO2, GLUCOSE, BUN, CREATININE, CALCIUM in the last 72 hours.  PT/INR: No results for input(s): LABPROT, INR in the last 72 hours. ABG    Component Value Date/Time   PHART 7.440 06/25/2015 1309   HCO3 27.9 (H) 06/25/2015 1313   TCO2 33 11/28/2015 1707   O2SAT 65.0 06/25/2015 1313   CBG (last 3)  Recent Labs    12/09/17 0608 12/10/17 0605 12/11/17 0614  GLUCAP 90 100* 120*    Assessment/Plan:  S/P chest tube placement on 10/11 for pneumothorax  1. CXR shows no pneumothorax. The chest tube is on water seal without an airleak. 74ml of drainage in 24 hours.  2. CV- NSR with frequent PVCs. BP has been well controlled 3. Renal-creatinine 1.00 4. Continue to work on pain control with oral oxycodone 5. Hyperlipidemia-continue statin therapy  Plan:  work on better pain control. Try miralax for constipation. Pigtail catheter removal today-resolution of pneumo and no air leak.    LOS: 4 days    [name] 12/11/2017  Chest removed  I have seen and examined [name] and agree with the above assessment  and plan.  James Ovens MD Beeper [phone] Office [phone] 12/12/2017 7:23 AM

## 2017-12-12 ENCOUNTER — Inpatient Hospital Stay (HOSPITAL_COMMUNITY): Payer: Medicare Other

## 2017-12-12 DIAGNOSIS — K5900 Constipation, unspecified: Secondary | ICD-10-CM

## 2017-12-12 NOTE — Evaluation (Signed)
Physical Therapy Evaluation Patient Details Name: [name] MRN: [id number] DOB: September 30, 1945 Today's Date: 12/12/2017   History of Present Illness  Pt is a 72 y/o male admitted secondary to a fib with RVR. Also found to have spontaneous pneumothorax and had chest tube insertion which has sense been removed. Pt also with UTI. Per notes will likely have EGD with esophageal dilation during admission. PMH includes R AKA, Hepatits C, PVD, nonischemic cardiomyopathy, a fib, and CHF.   Clinical Impression  Pt admitted secondary to problem above with deficits below. Pt requiring min guard A to perform squat pivot transfer X2 and required min A to ambulate short distance using RW. Administered handout about stair navigation using WC and 2 person assist for increased safety at home with stair management. Will continue to follow acutely to maximize functional mobility independence and safety.     Follow Up Recommendations Home health PT;Supervision for mobility/OOB    Equipment Recommendations  None recommended by PT    Recommendations for Other Services       Precautions / Restrictions Precautions Precautions: Fall;Other (comment) Precaution Comments: R AKA  Restrictions Weight Bearing Restrictions: No      Mobility  Bed Mobility Overal bed mobility: Modified Independent                Transfers Overall transfer level: Needs assistance Equipment used: Rolling walker (2 wheeled);None Transfers: Systems analyst;Sit to/from Stand Sit to Stand: Min guard   Squat pivot transfers: Min guard     General transfer comment: Min guard to perform squat pivot (unconventionally) to chair and back to bed with min guard. Reaching for armrests on chair to perform. Attempted standing with RW following transfers and pt requiring min guard and cues for safe hand placement.   Ambulation/Gait Ambulation/Gait assistance: Min assist Gait Distance (Feet): 1 Feet Assistive device:  Rolling walker (2 wheeled) Gait Pattern/deviations: Step-to pattern Gait velocity: Decreased    General Gait Details: Hop to gait for a few steps as pt reports he uses RW to ambulate in bathroom at home. Required min A for steadying assist.  Stairs            Wheelchair Mobility    Modified Rankin (Stroke Patients Only)       Balance Overall balance assessment: Needs assistance Sitting-balance support: No upper extremity supported;Feet supported Sitting balance-Leahy Scale: Good     Standing balance support: Bilateral upper extremity supported;During functional activity Standing balance-Leahy Scale: Poor Standing balance comment: Reliant on BUE support                              Pertinent Vitals/Pain Pain Assessment: No/denies pain    Home Living Family/patient expects to be discharged to:: Private residence Living Arrangements: Non-relatives/Friends;Other relatives(Ex wife; grandchildren ) Available Help at Discharge: Available 24 hours/day Type of Home: Mobile home Home Access: Stairs to enter Entrance Stairs-Rails: Right;Left Entrance Stairs-Number of Steps: 3 Home Layout: One level Home Equipment: Walker - 2 wheels;Cane - single point;Shower seat;Wheelchair - manual Additional Comments: HAs RLE prosthetic, but does not use as it is too large.     Prior Function Level of Independence: Needs assistance   Gait / Transfers Assistance Needed: Reports using WC for mobility and uses RW to ambulate short distance into bathroom using RW. Does report he needs assist to go up/down steps.   ADL's / Homemaking Assistance Needed: Reports he sponge bathes and needs assist to perform  sponge baths occasionally.         Hand Dominance   Dominant Hand: Right    Extremity/Trunk Assessment   Upper Extremity Assessment Upper Extremity Assessment: Generalized weakness    Lower Extremity Assessment Lower Extremity Assessment: RLE deficits/detail;Generalized  weakness RLE Deficits / Details: R AKA at baseline     Cervical / Trunk Assessment Cervical / Trunk Assessment: Kyphotic  Communication   Communication: No difficulties  Cognition Arousal/Alertness: Awake/alert Behavior During Therapy: Flat affect Overall Cognitive Status: No family/caregiver present to determine baseline cognitive functioning                                 General Comments: Pt reporting conflicting information at some points during session.       General Comments General comments (skin integrity, edema, etc.): Pt reports he feels uncomfortable trying to navigate steps at home as he feels like his family will drop him. Gave handout demonstrating how to use WC to get up and down steps with 2 person assist for increased safety.     Exercises     Assessment/Plan    PT Assessment Patient needs continued PT services  PT Problem List Decreased strength;Decreased balance;Decreased mobility;Decreased knowledge of use of DME;Pain       PT Treatment Interventions DME instruction;Gait training;Functional mobility training;Therapeutic activities;Therapeutic exercise;Balance training;Wheelchair mobility training;Patient/family education    PT Goals (Current goals can be found in the Care Plan section)  Acute Rehab PT Goals Patient Stated Goal: to go home  PT Goal Formulation: With patient Time For Goal Achievement: 12/26/17 Potential to Achieve Goals: Good    Frequency Min 3X/week   Barriers to discharge        Co-evaluation               AM-PAC PT "6 Clicks" Daily Activity  Outcome Measure Difficulty turning over in bed (including adjusting bedclothes, sheets and blankets)?: A Little Difficulty moving from lying on back to sitting on the side of the bed? : A Little Difficulty sitting down on and standing up from a chair with arms (e.g., wheelchair, bedside commode, etc,.)?: Unable Help needed moving to and from a bed to chair (including a  wheelchair)?: A Little Help needed walking in hospital room?: A Little Help needed climbing 3-5 steps with a railing? : A Lot 6 Click Score: 15    End of Session   Activity Tolerance: Patient tolerated treatment well Patient left: in bed;with call bell/phone within reach Nurse Communication: Mobility status PT Visit Diagnosis: Unsteadiness on feet (R26.81);Difficulty in walking, not elsewhere classified (R26.2);Muscle weakness (generalized) (M62.81)    Time: 1355-1416 PT Time Calculation (min) (ACUTE ONLY): 21 min   Charges:   PT Evaluation $PT Eval Low Complexity: 1 Low          Gladys Damme, PT, DPT  Acute Rehabilitation Services  Pager: [phone] Office: [phone]   Lehman Prom 12/12/2017, 6:13 PM

## 2017-12-12 NOTE — Discharge Summary (Signed)
Name: [name] MRN: [id number] DOB: 10/05/1945 72 y.o. PCP: [facility]  Date of Admission: 12/07/2017  9:21 AM Date of Discharge: 12/13/2017 Attending Physician: Levert Feinstein, MD  Discharge Diagnosis: 1. Left sided spontaneous pneumothorax 2. Dysphagia 3. UTI  Discharge Medications: Allergies as of 12/13/2017   No Known Allergies     Medication List    TAKE these medications   apixaban 5 MG Tabs tablet Commonly known as:  ELIQUIS Take 1 tablet (5 mg total) by mouth 2 (two) times daily.   ascorbic acid 250 MG tablet Commonly known as:  VITAMIN C Take 1 tablet (250 mg total) by mouth 2 (two) times daily.   atorvastatin 40 MG tablet Commonly known as:  LIPITOR Take 1 tablet (40 mg total) by mouth daily at 6 PM.   Digoxin 62.5 MCG Tabs Take 0.0625 mg by mouth daily.   finasteride 5 MG tablet Commonly known as:  PROSCAR Take 1 tablet (5 mg total) by mouth daily.   Ipratropium-Albuterol 20-100 MCG/ACT Aers respimat Commonly known as:  COMBIVENT Inhale 1 puff into the lungs every 6 (six) hours.   midodrine 5 MG tablet Commonly known as:  PROAMATINE Take 1 tablet (5 mg total) by mouth 2 (two) times daily at 10 AM and 5 PM.   mometasone-formoterol 200-5 MCG/ACT Aero Commonly known as:  DULERA Inhale 2 puffs into the lungs 2 (two) times daily.   multivitamin with minerals Tabs tablet Take 1 tablet by mouth daily.   pantoprazole 40 MG tablet Commonly known as:  PROTONIX Take 1 tablet (40 mg total) by mouth daily.   senna 8.6 MG Tabs tablet Commonly known as:  SENOKOT Take 1 tablet (8.6 mg total) by mouth 2 (two) times daily.       Disposition and follow-up:   [name] was discharged from [facility] in Stable condition.  At the hospital follow up visit please address:  1.  Dysphagia: patient to start taking PPI daily, assess compliance   2. UTI: treated with 5 days of abx, recommend UA follow up in  4-6 weeks after discharge  3. Untreated Hepatitis C: Pt stated he plans to take treatment at the [place], please assess   3.  Labs / imaging needed at time of follow-up: UA  4.  Pending labs/ test needing follow-up: surgical pathology from EGD  Follow-up Appointments:   [facility]  Hospital Course by problem list:  [name] a 72 y.o. gentleman with PMHx significant for A. fib, HFrEF,COPD, emphysema, hep C, Right AKA and treated TB brought to ED with worsening shortness of breath, found to be in A. fib with RVR and pneumothorax.  1. Spontaneous left pneumothorax-patient presented with worsening cough with productive sputum for the past 2 weeks.  Admission his breathing got worse so he called EMS.  EMS found him to have A. fib with RVR with heart rate in the 180s improved after getting 5 mg of metoprolol.  Presented to the ED stable x-ray showed left pneumothorax.  IR placed a chest tube under CT guidance. Chest tube was removed on 10/15 and repeat CXR showed no pneumothorax.   2. UTI- Patient presented with urinary symptoms on admission. Urine culture grew Klebsiella oxytoca. He was treated with IV ceftriaxone x3 and keflex x2. Recommend UA follow up in 4-6 weeks from discharge.  3. Dysphagia- Patient complained of difficulty swallowing foods during  admission. Esophagram showed distal narrowing of the esophagus. GI performed an EGD with dilatation. They found no abnormal findings but dilated to help improve patient's symptoms and recommended daily PPI.   4. Untreated hep C- Plan for outpatient treatment at the [facility]  Discharge Vitals:   BP 127/70   Pulse 77   Temp 97.7 F (36.5 C) (Oral)   Resp (!) 26   Ht 6' (1.829 m)   Wt 42.9 kg   SpO2 97%   BMI 12.83 kg/m   Pertinent Labs, Studies, and Procedures:   CBC Latest Ref Rng & Units 12/07/2017 04/03/2017 10/08/2016  WBC 4.0 - 10.5 K/uL 7.9 8.9 6.6  Hemoglobin 13.0 - 17.0 g/dL 63.8  12.1(L) 12.9(L)  Hematocrit 39.0 - 52.0 % 46.8 36.1(L) 39.2(L)  Platelets 150 - 400 K/uL 175 214 110(L)   CMP Latest Ref Rng & Units 12/08/2017 12/07/2017 04/03/2017  Glucose 70 - 99 mg/dL 756(E) 91 98  BUN 8 - 23 mg/dL 33(I) 18 17  Creatinine 0.61 - 1.24 mg/dL 9.51 8.84 1.66  Sodium 135 - 145 mmol/L 139 138 134(L)  Potassium 3.5 - 5.1 mmol/L 3.5 3.7 4.0  Chloride 98 - 111 mmol/L 104 104 97(L)  CO2 22 - 32 mmol/L 27 24 31   Calcium 8.9 - 10.3 mg/dL 0.6(T) 0.1(S) 0.1(U)  Total Protein 6.5 - 8.1 g/dL - 7.3 6.7  Total Bilirubin 0.3 - 1.2 mg/dL - 0.5 0.7  Alkaline Phos 38 - 126 U/L - 109 96  AST 15 - 41 U/L - 45(H) 51(H)  ALT 0 - 44 U/L - 36 45   Dg Chest 1 View  Result Date: 12/11/2017 CLINICAL DATA:  Cough, chest tube treatment for pneumothorax. History of hepatitis-C, recurrent pneumothoraces, chronic atrial fibrillation, history of tuberculosis. EXAM: CHEST  1 VIEW COMPARISON:  PA and lateral chest x-ray of December 10, 2017 FINDINGS: The lungs are hyperinflated. No definite residual pneumothorax is observed. The pigtail pleural drainage catheter on the left is in stable position at the lung base medially. There are pleuroparenchymal calcifications bilaterally in the mid and upper lobes which are stable. The heart and pulmonary vascularity are normal. There is calcification in the wall of the thoracic aorta. IMPRESSION: No residual pneumothorax is observed. The small caliber chest tube is in stable position on the left. 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 Underlying COPD. Thoracic aortic atherosclerosis. Electronically Signed   By: David  [place] M.D.   On: 12/11/2017 08:51   Dg Esophagus  Result Date: 12/11/2017 CLINICAL DATA:  Difficulty swallowing solids EXAM: ESOPHOGRAM/BARIUM SWALLOW TECHNIQUE: Single contrast examination was performed using  thin barium. FLUOROSCOPY TIME:  Fluoroscopy Time:  2 minutes Radiation Exposure Index (if provided by the fluoroscopic  device): Number of Acquired Spot Images: 0 COMPARISON:  None. FINDINGS: Fluoroscopic evaluation of swallowing demonstrates disruption of primary esophageal peristaltic waves. Small hiatal hernia with mild focal narrowing in the distal esophagus just above the hernia. The 13 mm barium tablet sticks in this area and reproduces the patient's symptoms. IMPRESSION: Small hiatal hernia. Smooth focal narrowing in the distal esophagus which the 13 mm barium tablet sticks at and reproduces the patient's symptoms. Electronically Signed   By: Charlett Nose M.D.   On: 12/11/2017 08:32   Dg Chest Port 1 View  Result Date: 12/12/2017 CLINICAL DATA:  Chest tube removal. EXAM: PORTABLE CHEST 1 VIEW COMPARISON:  Radiograph of December 11, 2017. FINDINGS: Stable cardiomediastinal silhouette. Left-sided chest tube has been removed. No definite pneumothorax is noted.  Stable pleuroparenchymal calcifications are noted in both upper lobes. No new pulmonary finding is noted. Minimal left pleural effusion may be present. Bony thorax is unremarkable. IMPRESSION: No pneumothorax status post left-sided chest tube removal. Minimal left pleural effusion. Electronically Signed   By: Lupita Raider, M.D.   On: 12/12/2017 10:27   Dg Chest Port 1 View  Result Date: 12/10/2017 CLINICAL DATA:  Left apical and right mid lung calcifications are unchanged EXAM: PORTABLE CHEST 1 VIEW COMPARISON:  12/09/2017; 12/08/2017; 12/07/2017; CT-guided left-sided chest tube placement-12/07/2017 FINDINGS: Grossly unchanged cardiac silhouette and mediastinal contours with atherosclerotic plaque within the thoracic aorta. Stable position of support apparatus. No definite pneumothorax. The lungs remain hyperexpanded. Right mid and left apical calcifications/granuloma are unchanged. No new focal airspace opacities. No pleural effusion. No evidence of edema. No acute osseus abnormalities. IMPRESSION: 1. Stable position of support apparatus.  No definite  pneumothorax. 2. Similar findings of lung hyperexpansion and sequela of prior granulomatous infection. Electronically Signed   By: Simonne Come M.D.   On: 12/10/2017 07:44    Discharge Instructions: Discharge Instructions    Diet - low sodium heart healthy   Complete by:  As directed    Diet - low sodium heart healthy   Complete by:  As directed    Increase activity slowly   Complete by:  As directed    Increase activity slowly   Complete by:  As directed      Mr. Swirsky,   It was a pleasure taking care of you during your hospital stay. You were hospitalized for a collapsed lung. While you were here we also found a urinary tract infection for which you were treated for with 5 days of antibiotics. We recommend you follow up at the [place] in 4-6 weeks for a urinary analysis to make sure you don't have any more urinary tract infections. We also evaluated your difficulty swallowing and found you have some narrowing of your esophagus. The GI doctor's performed an scope procedure with dilation which will help you swallow foods.  Please resume all your home medications. You can also plan to take senokot for your constipation as needed. We have also given you a prescription for Protonix 40 mg to take once a day to help prevent acid reflux.  [facility]   SignedJaci Standard, DO 12/13/2017, 1:00 PM   Pager:  [phone]

## 2017-12-12 NOTE — Progress Notes (Signed)
Due to intervening emergencies, pt's EGD +/- dilation postponed until tomorrow at 0830.  Diet ordered for today.   NPO after midnight.     Jennye Moccasin PA-C

## 2017-12-12 NOTE — Progress Notes (Signed)
      [address]       [name] [address]             [phone]        Procedure(s) (LRB): ESOPHAGOGASTRODUODENOSCOPY (EGD) WITH PROPOFOL (N/A) Subjective: He is upset he has to be NPO until 2:30 this afternoon. Otherwise doing okay  Objective: Vital signs in last 24 hours: Temp:  [97.9 F (36.6 C)-98.6 F (37 C)] 97.9 F (36.6 C) (10/16 0320) Pulse Rate:  [58-69] 58 (10/16 0320) Cardiac Rhythm: Normal sinus rhythm (10/16 0702) Resp:  [15-19] 16 (10/16 0320) BP: (128-140)/(79-91) 140/84 (10/16 0320) SpO2:  [94 %-96 %] 94 % (10/16 0320) Weight:  [44.2 kg] 44.2 kg (10/16 0252)     Intake/Output from previous day: 10/15 0701 - 10/16 0700 In: 720 [P.O.:720] Out: 1215 [Urine:1215] Intake/Output this shift: No intake/output data recorded.  General appearance: alert, cooperative and no distress Heart: regular rate and rhythm, S1, S2 normal, no murmur, click, rub or gallop Lungs: clear to auscultation bilaterally Abdomen: soft, non-tender; bowel sounds normal; no masses,  no organomegaly Extremities: extremities normal, atraumatic, no cyanosis or edema Wound: clean and dry  Lab Results: No results for input(s): WBC, HGB, HCT, PLT in the last 72 hours. BMET: No results for input(s): NA, K, CL, CO2, GLUCOSE, BUN, CREATININE, CALCIUM in the last 72 hours.  PT/INR: No results for input(s): LABPROT, INR in the last 72 hours. ABG    Component Value Date/Time   PHART 7.440 06/25/2015 1309   HCO3 27.9 (H) 06/25/2015 1313   TCO2 33 11/28/2015 1707   O2SAT 65.0 06/25/2015 1313   CBG (last 3)  Recent Labs    12/10/17 0605 12/11/17 0614  GLUCAP 100* 120*    Assessment/Plan: S/P Procedure(s) (LRB): ESOPHAGOGASTRODUODENOSCOPY (EGD) WITH PROPOFOL (N/A)  1. CXR shows no pneumothorax. Will await official read. Chest tube removed yesterday.  2. CV- NSR with frequent PVCs. BP has been well controlled 3. Renal-creatinine 1.00 4. Continue to work on pain  control with oral oxycodone 5. Hyperlipidemia-continue statin therapy 6. GI-NPO for EGD today. No bowel movement in several days. Have tried many oral laxatives. May need a suppository.   Plan: Continue GI workup. Chest tube out and CXR stable. Will follow intermittently.    LOS: 5 days    Sharlene Dory 12/12/2017

## 2017-12-12 NOTE — H&P (View-Only) (Signed)
Due to intervening emergencies, pt's EGD +/- dilation postponed until tomorrow at 0830.  Diet ordered for today.   NPO after midnight.     Tinia Oravec PA-C 

## 2017-12-12 NOTE — Progress Notes (Addendum)
   Subjective: [name] reported feeling okay this morning. He said his breathing felt fine. He was still having difficulty swallowing foods. He understood he is scheduled for an EGD today which may help him swallow his foods.   Objective:  Vital signs in last 24 hours: Vitals:   12/11/17 1145 12/11/17 1915 12/12/17 0252 12/12/17 0320  BP: (!) 128/91 136/79  140/84  Pulse: 69 63  (!) 58  Resp: 19 15  16   Temp: 98.6 F (37 C) 97.9 F (36.6 C)  97.9 F (36.6 C)  TempSrc: Oral Oral  Oral  SpO2: 96% 95%  94%  Weight:   44.2 kg   Height:       Physical Exam  Constitutional: He is well-developed, well-nourished, and in no distress.  Cardiovascular: Normal rate, regular rhythm and normal heart sounds.  No murmur heard. Pulmonary/Chest: Effort normal and breath sounds normal. No respiratory distress. He has no wheezes.  Abdominal: Soft. Bowel sounds are normal. He exhibits no distension.  Skin: Skin is warm and dry.    Assessment/Plan:  Active Problems:   Hepatitis C antibody positive in blood   PVD (peripheral vascular disease) (HCC)   Recurrent spontaneous pneumothorax   Nonischemic cardiomyopathy (HCC)   Chronic atrial fibrillation   S/P unilateral BKA (below knee amputation), right (HCC)   Dysphagia   Stricture and stenosis of esophagus  Recurrent Spontaneous secondary left pneumothorax Chest tube removed yesterday. Repeat CXR showed no pneumothorax. Minimal left pleural effusion - CVTS following   Urinary tract infection Urine culturegrowing Klebsiella oxytoca.He received IV ceftriaxone x3 and keflex for 2 days.  - discontinue abx -UA follow-up in 4-6 weeks after discharge   Paroxysmal A. Fib Ratescontinue to becontrolled not on medication. He declines anticoagulation. Will continue to follow and restart beta blocker if needed. -Monitor  Globus sensation - EGD with possible dilatation scheduled for today   Dispo: Anticipated discharge is today  pending EGD  Jaci Standard, DO 12/12/2017, 12:14 PM Pager: [phone]

## 2017-12-12 NOTE — Progress Notes (Addendum)
  Date: 12/12/2017  Patient name: JAXSTEN ATCITTY  Medical record number: [id number]  Date of birth: [birth year]   I saw and examined the patient. I reviewed the resident's note and I agree with the resident's findings and plan as documented in the resident's note.  Patient feels better today.  He does complain of some constipation this morning.  Patient was initially admitted for recurrent spontaneous left-sided pneumothorax.  Cardiothoracic surgery follow-up and recommendations appreciated.  Chest tube was removed yesterday.  Repeat chest x-ray with no evidence of pneumothorax.  Patient is scheduled for an EGD today for dilation secondary to history of dysphagia and a barium swallow showing narrowing in the esophagus.  No further work-up at this time.  Patient is completed a course of antibiotics for his urinary tract infection.  Patient is stable for discharge home either later today after his procedure if he is tolerating orally otherwise will be discharged home tomorrow morning.  We have started him on senna for his constipation.  Patient has refused MiraLAX.   Earl Lagos, MD 12/12/2017, 1:55 PM

## 2017-12-13 ENCOUNTER — Encounter (HOSPITAL_COMMUNITY)
Admission: EM | Disposition: A | Discharge status: Home Health | Payer: Self-pay | Source: Home / Self Care | Attending: Oncology

## 2017-12-13 ENCOUNTER — Inpatient Hospital Stay (HOSPITAL_COMMUNITY): Payer: Medicare Other | Admitting: Certified Registered Nurse Anesthetist

## 2017-12-13 ENCOUNTER — Encounter (HOSPITAL_COMMUNITY): Payer: Self-pay | Admitting: Certified Registered Nurse Anesthetist

## 2017-12-13 DIAGNOSIS — K2970 Gastritis, unspecified, without bleeding: Secondary | ICD-10-CM

## 2017-12-13 DIAGNOSIS — R933 Abnormal findings on diagnostic imaging of other parts of digestive tract: Secondary | ICD-10-CM

## 2017-12-13 DIAGNOSIS — K2990 Gastroduodenitis, unspecified, without bleeding: Secondary | ICD-10-CM

## 2017-12-13 DIAGNOSIS — J9312 Secondary spontaneous pneumothorax: Secondary | ICD-10-CM

## 2017-12-13 DIAGNOSIS — Z89611 Acquired absence of right leg above knee: Secondary | ICD-10-CM

## 2017-12-13 DIAGNOSIS — K2980 Duodenitis without bleeding: Secondary | ICD-10-CM

## 2017-12-13 DIAGNOSIS — Z79899 Other long term (current) drug therapy: Secondary | ICD-10-CM

## 2017-12-13 HISTORY — PX: ESOPHAGOGASTRODUODENOSCOPY (EGD) WITH PROPOFOL: SHX5813

## 2017-12-13 HISTORY — PX: BALLOON DILATION: SHX5330

## 2017-12-13 HISTORY — PX: BIOPSY: SHX5522

## 2017-12-13 LAB — GLUCOSE, CAPILLARY: Glucose-Capillary: 90 mg/dL (ref 70–99)

## 2017-12-13 SURGERY — ESOPHAGOGASTRODUODENOSCOPY (EGD) WITH PROPOFOL
Anesthesia: Monitor Anesthesia Care

## 2017-12-13 MED ORDER — SODIUM CHLORIDE 0.9 % IV SOLN
INTRAVENOUS | Status: DC | PRN
  Administered 2017-12-13: 80 ug/min via INTRAVENOUS

## 2017-12-13 MED ORDER — PROPOFOL 500 MG/50ML IV EMUL
INTRAVENOUS | Status: DC | PRN
  Administered 2017-12-13: 75 ug/kg/min via INTRAVENOUS

## 2017-12-13 MED ORDER — PROPOFOL 10 MG/ML IV BOLUS
INTRAVENOUS | Status: DC | PRN
  Administered 2017-12-13: 20 mg via INTRAVENOUS

## 2017-12-13 MED ORDER — LIDOCAINE HCL (CARDIAC) PF 100 MG/5ML IV SOSY
PREFILLED_SYRINGE | INTRAVENOUS | Status: DC | PRN
  Administered 2017-12-13: 60 mg via INTRAVENOUS

## 2017-12-13 MED ORDER — SENNA 8.6 MG PO TABS: 1.0000 | ORAL_TABLET | Freq: Two times a day (BID) | ORAL | 0 refills | Status: AC

## 2017-12-13 MED ORDER — LACTATED RINGERS IV SOLN
INTRAVENOUS | Status: DC
  Administered 2017-12-13 (×2): via INTRAVENOUS

## 2017-12-13 MED ORDER — PANTOPRAZOLE SODIUM 40 MG PO TBEC: 40.0000 mg | DELAYED_RELEASE_TABLET | Freq: Every day | ORAL | 0 refills | Status: AC

## 2017-12-13 SURGICAL SUPPLY — 15 items

## 2017-12-13 NOTE — Progress Notes (Signed)
   Subjective: [name] reported feeling fine this morning. He said his breathing was doing fine. He was seen after his EGD procedure. He reported some lower back pain, chronic in nature. All questions and concerns were addressed.   Objective:  Vital signs in last 24 hours: Vitals:   12/12/17 2035 12/13/17 0335 12/13/17 0338 12/13/17 0422  BP: 110/79 (!) 141/82    Pulse: 64 62    Resp: 18 (!) 34    Temp: 98.1 F (36.7 C)  98.3 F (36.8 C)   TempSrc: Oral  Oral   SpO2: 95% 95%    Weight:    42.9 kg  Height:       General- seen sitting up in the chair, NAD Heart- RRR, no murmurs Lungs- CTA bilaterally Abdomen- bowel sounds present   Assessment/Plan:  Active Problems:   Hepatitis C antibody positive in blood   PVD (peripheral vascular disease) (HCC)   Recurrent spontaneous pneumothorax   Nonischemic cardiomyopathy (HCC)   Chronic atrial fibrillation   S/P unilateral BKA (below knee amputation), right (HCC)   Dysphagia   Stricture and stenosis of esophagus  Recurrent Spontaneous secondary left pneumothorax - stable  Urinary tract infection - completed 5 days of antibiotic therapy -UA follow-up in 4-6 weeks after discharge   Paroxysmal A. Fib Ratescontinue to becontrolled not on medication. He declines anticoagulation. Will continue to follow and restart beta blocker if needed. -Monitor  Globus sensation - EGD with possible dilatation today, no abnormal findings that would explain dysphagia, however dilation was performed to help patient's symptoms - will continue daily PPI - appreciate GI recommendations  Dispo: Anticipated discharge is today.  Jaci Standard, DO 12/13/2017, 6:46 AM Pager: [phone]

## 2017-12-13 NOTE — Anesthesia Preprocedure Evaluation (Addendum)
Anesthesia Evaluation  Patient identified by MRN, date of birth, ID band Patient awake    Reviewed: Allergy & Precautions, NPO status , Patient's Chart, lab work & pertinent test results  Airway Mallampati: II  TM Distance: >3 FB     Dental  (+) Poor Dentition, Missing, Dental Advisory Given   Pulmonary pneumonia, COPD, Current Smoker,    breath sounds clear to auscultation       Cardiovascular hypertension, + CAD, + Peripheral Vascular Disease and +CHF   Rhythm:Regular Rate:Normal     Neuro/Psych  Headaches,    GI/Hepatic GERD  ,(+) Hepatitis -  Endo/Other    Renal/GU Renal disease     Musculoskeletal   Abdominal   Peds  Hematology   Anesthesia Other Findings   Reproductive/Obstetrics                            Anesthesia Physical Anesthesia Plan  ASA: III  Anesthesia Plan: MAC   Post-op Pain Management:    Induction: Intravenous  PONV Risk Score and Plan: Treatment may vary due to age or medical condition  Airway Management Planned: Simple Face Mask and Nasal Cannula  Additional Equipment:   Intra-op Plan:   Post-operative Plan:   Informed Consent: I have reviewed the patients History and Physical, chart, labs and discussed the procedure including the risks, benefits and alternatives for the proposed anesthesia with the patient or authorized representative who has indicated his/her understanding and acceptance.   Dental advisory given  Plan Discussed with: CRNA and Anesthesiologist  Anesthesia Plan Comments:         Anesthesia Quick Evaluation

## 2017-12-13 NOTE — Anesthesia Postprocedure Evaluation (Signed)
Anesthesia Post Note  Patient: [name]  Procedure(s) Performed: ESOPHAGOGASTRODUODENOSCOPY (EGD) WITH PROPOFOL (N/A ) BIOPSY BALLOON DILATION (N/A )     Patient location during evaluation: Endoscopy Anesthesia Type: MAC Level of consciousness: awake Pain management: pain level controlled Vital Signs Assessment: post-procedure vital signs reviewed and stable Respiratory status: spontaneous breathing Cardiovascular status: stable Postop Assessment: no apparent nausea or vomiting Anesthetic complications: no    Last Vitals:  Vitals:   12/13/17 0930 12/13/17 0940  BP: (!) 144/54 127/70  Pulse: 81 77  Resp: 17 (!) 26  Temp:    SpO2: 99% 97%    Last Pain:  Vitals:   12/13/17 0921  TempSrc: Oral  PainSc: 8                  Dalaya Suppa

## 2017-12-13 NOTE — Progress Notes (Addendum)
Clinical Social Worker following patient for support. CSW received consult to assist patient with transportation need back home. Patient has a history of a R AKA and is needing transportation home. RN verified patient addressed. CSW wrote taxi voucher for patient and gave to RN to contact company when patient was dressed and ready. CSW signing off as social work needs have been met.  4:15: CSW received phone call from RN and she stated patient does not feel that he can ride in a Taxi Cab on his own. CSW arranged [facility] to transport patient back home. Patients ex-wife is at home waiting to let him into the home. CSW signing off.   Rhea Pink, MSW,  [place]

## 2017-12-13 NOTE — Progress Notes (Signed)
Internal Medicine Attending:   I saw and examined the patient. I reviewed the resident's note and I agree with the resident's findings and plan as documented in the resident's note.  Patient states that he feels well this morning.  His shortness of breath has resolved.  Patient was initially admitted to the hospital with a spontaneous left pneumothorax.  Patient had a chest tube placed initially and this was removed on 12/11/2017.  Repeat chest x-ray showed no evidence of pneumothorax.  Patient was also noted to have dysphagia when he was admitted.  GI follow-up recommendations appreciated.  Patient is status post EGD today with esophageal dilation.  Patient is also noted to have gastritis and duodenitis on his EGD.  Will start daily PPI and follow-up biopsy results to rule out H. pylori.  No further work-up at this time.  Patient is stable for discharge home today.

## 2017-12-13 NOTE — Progress Notes (Signed)
Pt. Discharged and educated, prescription provided, and IV removed and intact. Pt. Has all belongings (clothes) and transported via wheelchair to meet ride.Pt. Has no complaints at this time and vitals are stable.   Alvino Chapel, Student-RN

## 2017-12-13 NOTE — Discharge Instructions (Signed)
Mr. Barberi,   It was a pleasure taking care of you during your hospital stay. You were hospitalized for a collapsed lung. While you were here we also found a urinary tract infection for which you were treated for with 5 days of antibiotics. We recommend you follow up at the [place] in 4-6 weeks for a urinary analysis to make sure you don't have any more urinary tract infections. We also evaluated your difficulty swallowing and found you have some narrowing of your esophagus. The GI doctor's performed an scope procedure with dilation which will help you swallow foods.  Please resume all your home medications. You can also plan to take senokot for your constipation as needed. We have also given you a prescription for Protonix 40 mg to take once a day to help prevent acid reflux.  [facility]  Thank you for allowing [place] to be a part of your care!

## 2017-12-13 NOTE — Op Note (Signed)
[facility] Patient Name: [name] Procedure Date : 12/13/2017 MRN: [id number] Attending MD: Beverley Fiedler , MD Date of Birth: 1945/10/03 CSN: [id number] Age: 72 Admit Type: Inpatient Procedure:                Upper GI endoscopy Indications:              Dysphagia, Abnormal cine-esophagram Providers:                Carie Caddy. Rhea Belton, MD, Dwain Sarna, RN, Harrington Challenger,                            Technician Referring MD:             Mena Pauls, MD Medicines:                Monitored Anesthesia Care Complications:            No immediate complications. Estimated Blood Loss:     Estimated blood loss was minimal. Procedure:                Pre-Anesthesia Assessment:                           - Prior to the procedure, a History and Physical                            was performed, and patient medications and                            allergies were reviewed. The patient's tolerance of                            previous anesthesia was also reviewed. The risks                            and benefits of the procedure and the sedation                            options and risks were discussed with the patient.                            All questions were answered, and informed consent                            was obtained. Prior Anticoagulants: The patient has                            taken no previous anticoagulant or antiplatelet                            agents. ASA Grade Assessment: III - A patient with                            severe systemic disease. After reviewing the risks  and benefits, the patient was deemed in                            satisfactory condition to undergo the procedure.                           After obtaining informed consent, the endoscope was                            passed under direct vision. Throughout the                            procedure, the patient's blood pressure, pulse, and               oxygen saturations were monitored continuously. The                            [id number]-H190 ([id number]) Olympus Adult EGD was introduced                            through the mouth, and advanced to the second part                            of duodenum. The upper GI endoscopy was                            accomplished without difficulty. The patient                            tolerated the procedure well. Scope In: Scope Out: Findings:      No endoscopic abnormality was evident in the esophagus to explain the       patient's complaint of dysphagia. Z-line is regular around 40 cm. It was       decided, however, to proceed with dilation of the middle third of the       esophagus, of the lower third of the esophagus and at the       gastroesophageal junction (balloon inflated x 60 sec in lower esophagus       and across the GE junction, after 60 seconds the balloon was pulled into       the middle esophagus for further dilation). A TTS dilator was passed       through the scope. Dilation with a 15-16.5-18 mm balloon dilator was       performed to 18 mm.      The cardia and gastric fundus were normal on retroflexion.      Moderate inflammation characterized by congestion (edema) and erythema       was found in the gastric antrum. Biopsies were taken with a cold forceps       for histology and Helicobacter pylori testing.      Mild inflammation characterized by erythema and granularity was found in       the duodenal bulb.      The second portion of the duodenum was normal. Impression:               - No endoscopic esophageal abnormality to explain  patient's dysphagia. Esophagus dilated. Dilated.                           - Gastritis. Biopsied.                           - Duodenitis.                           - Normal second portion of the duodenum. Moderate Sedation:      N/A Recommendation:           - Return patient to hospital ward for ongoing care.                            - Advance diet as tolerated.                           - Continue present medications.                           - Daily PPI.                           - Avoid ibuprofen, naproxen, or other non-steroidal                            anti-inflammatory drugs.                           - Await pathology results. If positive for H.                            Pylori, antibiotic therapy will be prescribed. Procedure Code(s):        --- Professional ---                           [phone], Esophagogastroduodenoscopy, flexible,                            transoral; with transendoscopic balloon dilation of                            esophagus (less than 30 mm diameter)                           43239, 59, Esophagogastroduodenoscopy, flexible,                            transoral; with biopsy, single or multiple Diagnosis Code(s):        --- Professional ---                           R13.10, Dysphagia, unspecified                           K29.70, Gastritis, unspecified, without bleeding  K29.80, Duodenitis without bleeding                           R93.3, Abnormal findings on diagnostic imaging of                            other parts of digestive tract CPT copyright 2018 [employer]. All rights reserved. The codes documented in this report are preliminary and upon coder review may  be revised to meet current compliance requirements. Beverley Fiedler, MD 12/13/2017 9:35:41 AM This report has been signed electronically. Number of Addenda: 0

## 2017-12-13 NOTE — Interval H&P Note (Signed)
History and Physical Interval Note: For EGD today with MAC to eval dysphagia.  Possible dilation.  Discussed risks, benefits, alternatives with patient again today. The nature of the procedure, as well as the risks, benefits, and alternatives were carefully and thoroughly reviewed with the patient. Ample time for discussion and questions allowed. The patient understood, was satisfied, and agreed to proceed.     12/13/2017 8:47 AM  [name]  has presented today for surgery, with the diagnosis of esophageal dysphagia and abnormal barium esophagram  The various methods of treatment have been discussed with the patient and family. After consideration of risks, benefits and other options for treatment, the patient has consented to  Procedure(s): ESOPHAGOGASTRODUODENOSCOPY (EGD) WITH PROPOFOL (N/A) as a surgical intervention .  The patient's history has been reviewed, patient examined, no change in status, stable for surgery.  I have reviewed the patient's chart and labs.  Questions were answered to the patient's satisfaction.     Lajuan Lines Che Below

## 2017-12-13 NOTE — Transfer of Care (Signed)
Immediate Anesthesia Transfer of Care Note  Patient: OMKAR STRATMANN  Procedure(s) Performed: ESOPHAGOGASTRODUODENOSCOPY (EGD) WITH PROPOFOL (N/A ) BIOPSY BALLOON DILATION (N/A )  Patient Location: Endoscopy Unit  Anesthesia Type:MAC  Level of Consciousness: awake, alert  and oriented  Airway & Oxygen Therapy: Patient Spontanous Breathing and Patient connected to nasal cannula oxygen  Post-op Assessment: Report given to RN, Post -op Vital signs reviewed and stable and Patient moving all extremities  Post vital signs: Reviewed and stable  Last Vitals:  Vitals Value Taken Time  BP 108/71 12/13/2017  9:22 AM  Temp    Pulse 66 12/13/2017  9:22 AM  Resp 22 12/13/2017  9:22 AM  SpO2 100 % 12/13/2017  9:22 AM  Vitals shown include unvalidated device data.  Last Pain:  Vitals:   12/13/17 0756  TempSrc: Oral  PainSc:       Patients Stated Pain Goal: 3 (47/65/46 5035)  Complications: No apparent anesthesia complications

## 2017-12-19 ENCOUNTER — Encounter: Payer: Self-pay | Admitting: Internal Medicine

## 2019-02-03 IMAGING — DX DG CHEST 1V PORT
1 series · 1 of 1 positions shown · non-contrast
Comparison: 10/05/2016

CLINICAL DATA: Sudden onset chest pain

EXAM:
PORTABLE CHEST 1 VIEW

[chest ap]
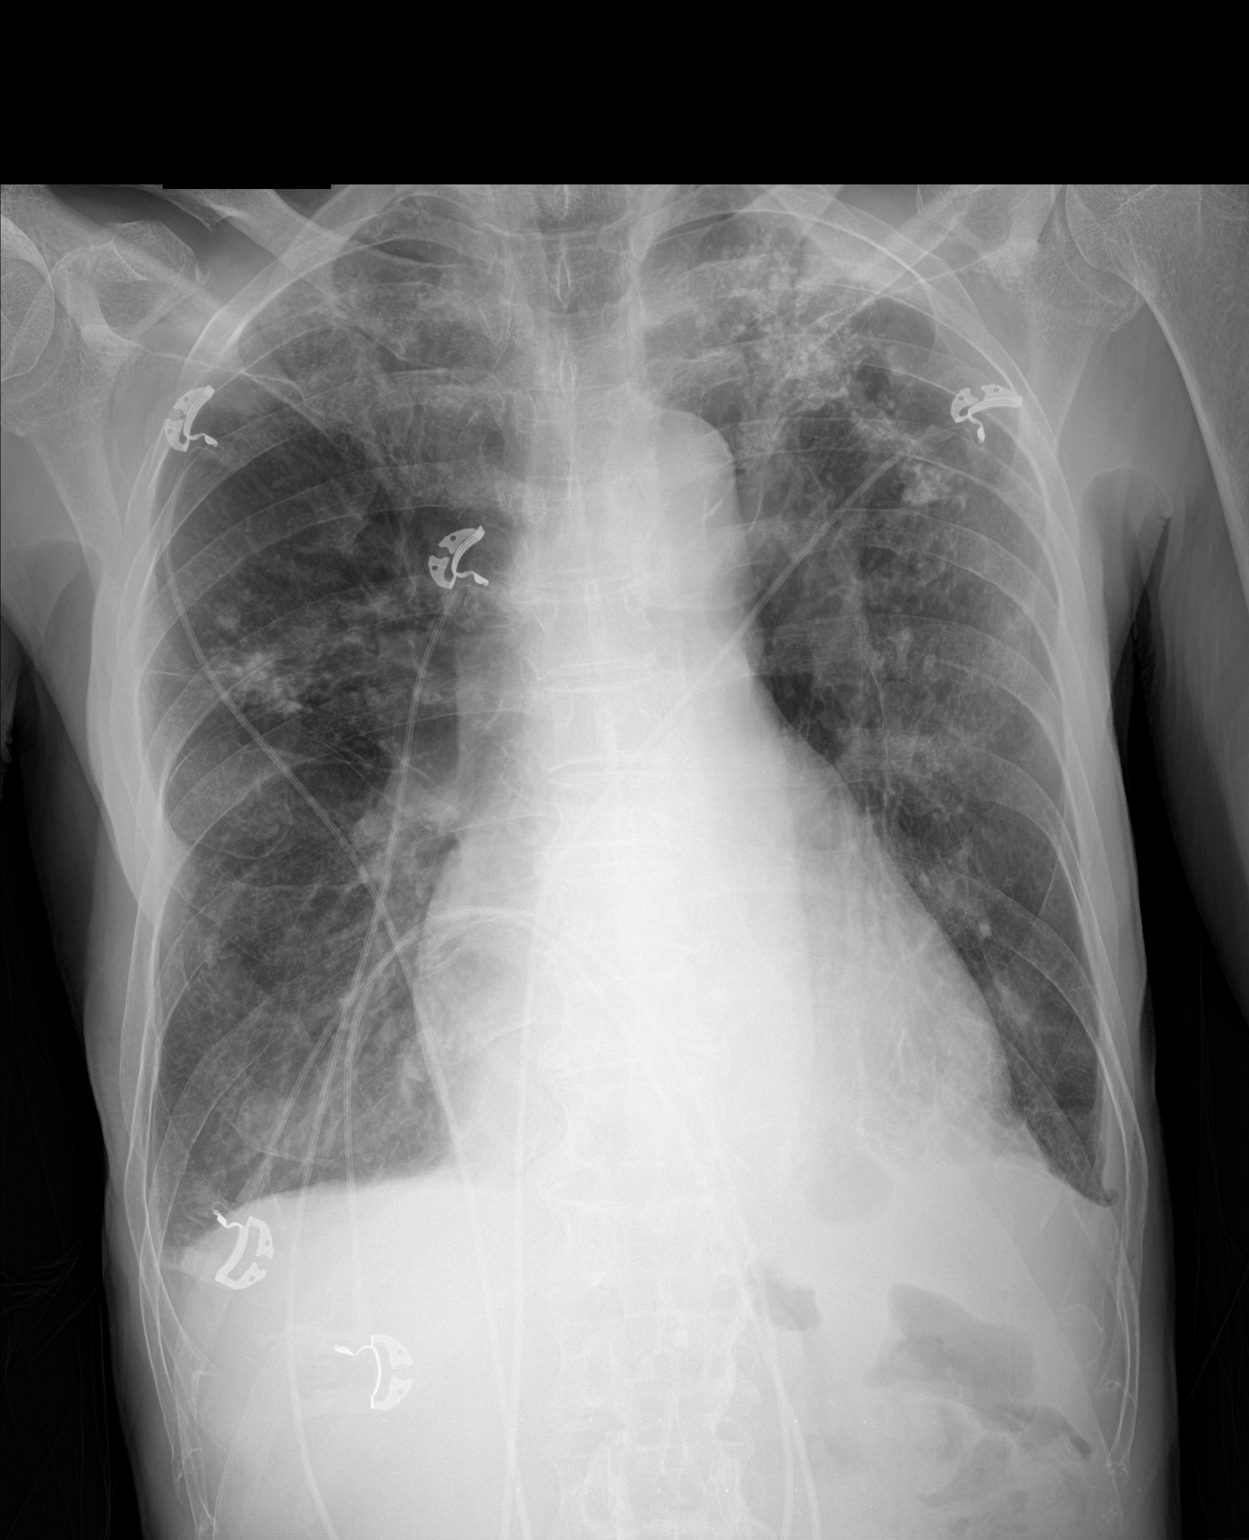

[1 of 1 positions shown; findings below may reference images not displayed]

FINDINGS: Cardiac shadow is enlarged. Aortic calcifications are again seen.
Diffuse chronic calcifications are noted throughout both lungs.
Minimal blunting of the costophrenic angles is again seen and
stable. No new focal infiltrate is seen.
IMPRESSION: Tiny pleural effusions bilaterally.

Chronic parenchymal scarring and calcifications.

## 2019-02-28 DEATH — deceased
